# Patient Record
Sex: Female | Born: 1995
Health system: Southern US, Community
[De-identification: ages and names within clinical notes are randomized; demographics above are authoritative.]

## PROBLEM LIST (undated history)

## (undated) ENCOUNTER — Inpatient Hospital Stay (HOSPITAL_COMMUNITY): Payer: Self-pay

## (undated) DIAGNOSIS — Z3402 Encounter for supervision of normal first pregnancy, second trimester: Secondary | ICD-10-CM

## (undated) DIAGNOSIS — Z789 Other specified health status: Secondary | ICD-10-CM

## (undated) HISTORY — PX: NO PAST SURGERIES: SHX2092

## (undated) HISTORY — DX: Encounter for supervision of normal first pregnancy, second trimester: Z34.02

---
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
## 2015-02-06 NOTE — L&D Delivery Note (Signed)
Delivery Note At 9:41 PM a viable female was delivered via Vaginal, Spontaneous Delivery (Presentation: vertex, left occiput anterior).  APGAR: 9, 9; weight 7lb 1.2 oz (3210 g).   Placenta status: Intact. 3 vessel cord.  Anesthesia: Epidural Episiotomy: None Lacerations: None Suture Repair: N/A Est. Blood Loss (mL): 300  Mom to postpartum.  Baby to Couplet care / Skin to Skin.  Jamelle HaringHillary M Fitzgerald, MD [facility], PGY-2 12/23/2015, 10:21 PM  Patient is a G1 at 2334w5d who was admitted with SROM, uncomplicated prenatal course.  She progressed without augmentation.  I was gloved and present for delivery in its entirety.  Second stage of labor progressed to SVD.  No decels during second stage noted.  Complications: none  Lacerations: none  EBL: 300cc  Cam HaiSHAW, KIMBERLY, CNM 9:37 AM  12/24/2015

## 2015-04-26 ENCOUNTER — Emergency Department (HOSPITAL_COMMUNITY): Payer: BLUE CROSS/BLUE SHIELD

## 2015-04-26 ENCOUNTER — Encounter (HOSPITAL_COMMUNITY): Payer: Self-pay | Admitting: Emergency Medicine

## 2015-04-26 ENCOUNTER — Emergency Department (HOSPITAL_COMMUNITY)
Admission: EM | Admit: 2015-04-26 | Discharge: 2015-04-26 | Disposition: A | Discharge status: Home / Self Care | Payer: BLUE CROSS/BLUE SHIELD | Attending: Emergency Medicine | Admitting: Emergency Medicine

## 2015-04-26 DIAGNOSIS — Z7952 Long term (current) use of systemic steroids: Secondary | ICD-10-CM | POA: Insufficient documentation

## 2015-04-26 DIAGNOSIS — O9989 Other specified diseases and conditions complicating pregnancy, childbirth and the puerperium: Secondary | ICD-10-CM | POA: Insufficient documentation

## 2015-04-26 DIAGNOSIS — R1031 Right lower quadrant pain: Secondary | ICD-10-CM | POA: Insufficient documentation

## 2015-04-26 DIAGNOSIS — O209 Hemorrhage in early pregnancy, unspecified: Secondary | ICD-10-CM | POA: Diagnosis not present

## 2015-04-26 DIAGNOSIS — Z3A01 Less than 8 weeks gestation of pregnancy: Secondary | ICD-10-CM | POA: Insufficient documentation

## 2015-04-26 DIAGNOSIS — R1032 Left lower quadrant pain: Secondary | ICD-10-CM | POA: Diagnosis not present

## 2015-04-26 DIAGNOSIS — R110 Nausea: Secondary | ICD-10-CM | POA: Diagnosis not present

## 2015-04-26 DIAGNOSIS — Z792 Long term (current) use of antibiotics: Secondary | ICD-10-CM | POA: Diagnosis not present

## 2015-04-26 DIAGNOSIS — R102 Pelvic and perineal pain: Secondary | ICD-10-CM

## 2015-04-26 DIAGNOSIS — R109 Unspecified abdominal pain: Secondary | ICD-10-CM

## 2015-04-26 DIAGNOSIS — O26899 Other specified pregnancy related conditions, unspecified trimester: Secondary | ICD-10-CM

## 2015-04-26 LAB — URINE MICROSCOPIC-ADD ON

## 2015-04-26 LAB — URINALYSIS, ROUTINE W REFLEX MICROSCOPIC
Bilirubin Urine: NEGATIVE
GLUCOSE, UA: NEGATIVE mg/dL
HGB URINE DIPSTICK: NEGATIVE
Ketones, ur: >80 mg/dL — AB
Leukocytes, UA: NEGATIVE
Nitrite: NEGATIVE
PH: 6 (ref 5.0–8.0)
Protein, ur: NEGATIVE mg/dL
SPECIFIC GRAVITY, URINE: 1.025 (ref 1.005–1.030)

## 2015-04-26 LAB — COMPREHENSIVE METABOLIC PANEL
ALT: 11 U/L — AB (ref 14–54)
ANION GAP: 11 (ref 5–15)
AST: 13 U/L — ABNORMAL LOW (ref 15–41)
Albumin: 3.7 g/dL (ref 3.5–5.0)
Alkaline Phosphatase: 48 U/L (ref 38–126)
BUN: 10 mg/dL (ref 6–20)
CHLORIDE: 108 mmol/L (ref 101–111)
CO2: 20 mmol/L — AB (ref 22–32)
Calcium: 9.6 mg/dL (ref 8.9–10.3)
Creatinine, Ser: 0.83 mg/dL (ref 0.44–1.00)
GFR calc non Af Amer: >60 mL/min (ref >60)
GLUCOSE: 112 mg/dL — AB (ref 65–99)
Potassium: 3.9 mmol/L (ref 3.5–5.1)
SODIUM: 139 mmol/L (ref 135–145)
Total Bilirubin: 0.4 mg/dL (ref 0.3–1.2)
Total Protein: 7 g/dL (ref 6.5–8.1)

## 2015-04-26 LAB — CBC
HCT: 38 % (ref 36.0–46.0)
Hemoglobin: 12 g/dL (ref 12.0–15.0)
MCH: 25.5 pg — AB (ref 26.0–34.0)
MCHC: 31.6 g/dL (ref 30.0–36.0)
MCV: 80.7 fL (ref 78.0–100.0)
Platelets: 252 10*3/uL (ref 150–400)
RBC: 4.71 MIL/uL (ref 3.87–5.11)
RDW: 13.4 % (ref 11.5–15.5)
WBC: 8.7 10*3/uL (ref 4.0–10.5)

## 2015-04-26 LAB — WET PREP, GENITAL
SPERM: NONE SEEN
TRICH WET PREP: NONE SEEN
Yeast Wet Prep HPF POC: NONE SEEN

## 2015-04-26 LAB — I-STAT BETA HCG BLOOD, ED (MC, WL, AP ONLY): I-stat hCG, quantitative: 375.4 m[IU]/mL — ABNORMAL HIGH (ref <5)

## 2015-04-26 LAB — LIPASE, BLOOD: LIPASE: 25 U/L (ref 11–51)

## 2015-04-26 MED ORDER — PROMETHAZINE HCL 12.5 MG PO TABS: 12.5000 mg | ORAL_TABLET | Freq: Four times a day (QID) | ORAL | Status: DC | PRN

## 2015-04-26 NOTE — ED Provider Notes (Signed)
CSN: [id number]     Arrival date & time 04/26/15  1009 History  By signing my name below, I, Soijett Blue, attest that this documentation has been prepared under the direction and in the presence of Kerrie BuffaloHope Neese, NP Electronically Signed: Soijett Blue, ED Scribe. 04/26/2015. 1:27 PM.   Chief Complaint  Patient presents with  . Abdominal Pain      Patient is a 20 y.o. female presenting with abdominal pain. The history is provided by the patient. No language interpreter was used.  Abdominal Pain Pain location:  LLQ and RLQ Pain quality: cramping   Pain radiates to:  Does not radiate Pain severity:  Moderate Onset quality:  Sudden Duration:  3 days Timing:  Constant Progression:  Unchanged Chronicity:  New Context comment:  Thinks she may be pregnant Relieved by:  None tried Worsened by:  Nothing tried Ineffective treatments:  None tried Associated symptoms: nausea and vaginal discharge   Associated symptoms: no dysuria, no vaginal bleeding and no vomiting     HPI Comments: Bruna PotterJasmine Randazzo is a 20 y.o. female who presents to the Emergency Department complaining of 8-9/10, intermittent, cramping, lower abdominal pain onset 3 days. She reports that she could be pregnant at this time, although she has never been pregnant in the past. Pt notes that she took a pregnancy test at home that was positive. Patient's last menstrual period was 03/20/2015 lasting two days and was light  She states that she is having associated symptoms of nausea, breast tenderness, and vaginal discharge. She states that she has not tried any medications for the relief for her symptoms. She denies frequency, dysuria, vaginal bleeding, vomiting, and any other symptoms. [facility]. Denies taking any daily medications or PMHx.    History reviewed. No pertinent past medical history. History reviewed. No pertinent past surgical history. No family history on file. Social History  Substance Use  Topics  . Smoking status: Never Smoker   . Smokeless tobacco: None  . Alcohol Use: Yes     Comment: social   OB History    No data available     Review of Systems  Gastrointestinal: Positive for nausea and abdominal pain. Negative for vomiting.  Genitourinary: Positive for vaginal discharge. Negative for dysuria and vaginal bleeding.  All other systems reviewed and are negative.    Allergies  Review of patient's allergies indicates no known allergies.  Home Medications   Prior to Admission medications   Medication Sig Start Date End Date Taking? Authorizing Provider  hydrocortisone valerate ointment (WESTCORT) 0.2 % Apply 1 application topically 2 (two) times daily. 12/18/13   Doreene ElandKehinde T Eniola, MD  promethazine (PHENERGAN) 12.5 MG tablet Take 1 tablet (12.5 mg total) by mouth every 6 (six) hours as needed for nausea or vomiting. 04/26/15   Hope Orlene OchM Neese, NP  sulfamethoxazole-trimethoprim (BACTRIM DS,SEPTRA DS) 800-160 MG tablet Take 1 tablet by mouth 2 (two) times daily. 11/12/14   Palma HolterKanishka G Gunadasa, MD   BP 121/86 mmHg  Pulse 108  Temp(Src) 99 F (37.2 C) (Oral)  Resp 18  Ht 5\' 11"  (1.803 m)  Wt 124.739 kg  BMI 38.37 kg/m2  SpO2 100%  LMP 03/20/2015 Physical Exam  Constitutional: She is oriented to person, place, and time. She appears well-developed and well-nourished. No distress.  HENT:  Head: Normocephalic and atraumatic.  Eyes: EOM are normal.  Neck: Neck supple.  Cardiovascular: Normal rate, regular rhythm and normal heart sounds.  Exam reveals no  gallop and no friction rub.   No murmur heard. Pulmonary/Chest: Effort normal and breath sounds normal. No respiratory distress. She has no wheezes. She has no rales.  Abdominal: Soft. Bowel sounds are normal. There is tenderness. There is no rebound, no guarding and no CVA tenderness.  Tenderness to lower abdomen with palpation.   Genitourinary:  Chaperone present for exam External genitalia without lesions, frothy,  malodorous d/c vaginal vault. Cervix, long, closed, no CMT, bilateral adnexal tenderness, uterus without palpable enlargement.   Musculoskeletal: Normal range of motion.  Neurological: She is alert and oriented to person, place, and time.  Skin: Skin is warm and dry.  Psychiatric: She has a normal mood and affect. Her behavior is normal.  Nursing note and vitals reviewed.   ED Course  Procedures (including critical care time) DIAGNOSTIC STUDIES: Oxygen Saturation is 100% on RA, nl by my interpretation.    COORDINATION OF CARE: 1:26 PM Discussed treatment plan with pt at bedside which includes labs, UA, pelvic exam, [place], and pt agreed to plan.    Labs Review Labs Reviewed  WET PREP, GENITAL - Abnormal; Notable for the following:    Clue Cells Wet Prep HPF POC PRESENT (*)    WBC, Wet Prep HPF POC MODERATE (*)    All other components within normal limits  COMPREHENSIVE METABOLIC PANEL - Abnormal; Notable for the following:    CO2 20 (*)    Glucose, Bld 112 (*)    AST 13 (*)    ALT 11 (*)    All other components within normal limits  CBC - Abnormal; Notable for the following:    MCH 25.5 (*)    All other components within normal limits  URINALYSIS, ROUTINE W REFLEX MICROSCOPIC (NOT AT [facility]) - Abnormal; Notable for the following:    APPearance TURBID (*)    Ketones, ur >80 (*)    All other components within normal limits  URINE MICROSCOPIC-ADD ON - Abnormal; Notable for the following:    Squamous Epithelial / LPF 6-30 (*)    Bacteria, UA RARE (*)    All other components within normal limits  I-STAT BETA HCG BLOOD, ED ([facility], [facility], [facility] ONLY) - Abnormal; Notable for the following:    I-stat hCG, quantitative 375.4 (*)    All other components within normal limits  LIPASE, BLOOD  GC/CHLAMYDIA PROBE AMP ([place]) NOT AT [facility]    Imaging Review US Ob Comp Less 14 Wks  04/26/2015  CLINICAL DATA:  20 year old female with pelvic pain in the first trimester of pregnancy. Quantitative  beta HCG 375. Initial encounter. EXAM: OBSTETRIC <14 WK [place] AND TRANSVAGINAL OB US TECHNIQUE: Both transabdominal and transvaginal ultrasound examinations were performed for complete evaluation of the gestation as well as the maternal uterus, adnexal regions, and pelvic cul-de-sac. Transvaginal technique was performed to assess early pregnancy. COMPARISON:  None. FINDINGS: Intrauterine gestational sac: Questionable tiny hypoechoic sac in the fundus of the uterus near the left cornu (image 53). Yolk sac:  Possibly visible (same image). Embryo:  Not visible. MSD: 2.1  mm   4 w   6  d Subchorionic hemorrhage:  None visualized. Maternal uterus/adnexae: Small volume of simple appearing pelvic free fluid. The right ovary appears normal measuring 2.4 x 3.0 x 2.1 cm. The left ovary is larger encompassing 3.2 x 4.4 x 2.4 cm, and contains a 2.2 cm complex hypoechoic mass -but mostly with reticular pattern of internal echoes. No solid elements identified on Doppler. IMPRESSION: 1. Probable early  intrauterine gestational sac, but no definite yolk sac, fetal pole, or cardiac activity yet visualized. Recommend follow-up quantitative B-HCG levels and follow-up US in 14 days to confirm and assess viability. This recommendation follows SRU consensus guidelines: Diagnostic Criteria for Nonviable Pregnancy Early in the First Trimester. Malva Limes Med 2013; 132:4401-02. 2. Left ovarian 2.2 cm complex lesion, but favor hemorrhagic cyst / corpus luteum. Ectopic pregnancy not excluded at this point. 3. Small volume simple appearing pelvic free fluid. Electronically Signed   By: Odessa Fleming M.D.   On: 04/26/2015 16:31   US Ob Transvaginal  04/26/2015  CLINICAL DATA:  20 year old female with pelvic pain in the first trimester of pregnancy. Quantitative beta HCG 375. Initial encounter. EXAM: OBSTETRIC <14 WK [place] AND TRANSVAGINAL OB US TECHNIQUE: Both transabdominal and transvaginal ultrasound examinations were performed for complete evaluation of  the gestation as well as the maternal uterus, adnexal regions, and pelvic cul-de-sac. Transvaginal technique was performed to assess early pregnancy. COMPARISON:  None. FINDINGS: Intrauterine gestational sac: Questionable tiny hypoechoic sac in the fundus of the uterus near the left cornu (image 53). Yolk sac:  Possibly visible (same image). Embryo:  Not visible. MSD: 2.1  mm   4 w   6  d Subchorionic hemorrhage:  None visualized. Maternal uterus/adnexae: Small volume of simple appearing pelvic free fluid. The right ovary appears normal measuring 2.4 x 3.0 x 2.1 cm. The left ovary is larger encompassing 3.2 x 4.4 x 2.4 cm, and contains a 2.2 cm complex hypoechoic mass -but mostly with reticular pattern of internal echoes. No solid elements identified on Doppler. IMPRESSION: 1. Probable early intrauterine gestational sac, but no definite yolk sac, fetal pole, or cardiac activity yet visualized. Recommend follow-up quantitative B-HCG levels and follow-up US in 14 days to confirm and assess viability. This recommendation follows SRU consensus guidelines: Diagnostic Criteria for Nonviable Pregnancy Early in the First Trimester. Malva Limes Med 2013; 725:3664-40. 2. Left ovarian 2.2 cm complex lesion, but favor hemorrhagic cyst / corpus luteum. Ectopic pregnancy not excluded at this point. 3. Small volume simple appearing pelvic free fluid. Electronically Signed   By: Odessa Fleming M.D.   On: 04/26/2015 16:31   I have personally reviewed and evaluated these images and lab results as part of my medical decision-making.  DDX ectopic pregnancy, early IUP, ovarian cyst, torsion Doubt appendicitis, PID, torsion due to no guarding or rebound   MDM  20 y.o. female with pelvic pain in early pregnancy stable for d/c without acute abdomen. Feeling better after medication for nausea. Ultrasound shows possible early IUGS and left complex ovarian cyst. Patient will f/u in 2 days in the [facility] for repeat Bhcg. She will  go to [facility] sooner for increased pain or other problems.   Final diagnoses:  Abdominal pain during pregnancy  Vaginal bleeding in pregnancy, first trimester   I personally performed the services described in this documentation, which was scribed in my presence. The recorded information has been reviewed and is accurate.   [address] [place], NP 04/27/15 0034  Rolland Porter, MD 05/04/15 [phone]

## 2015-04-26 NOTE — ED Notes (Signed)
Attempted to draw labs and unsuccessful.  Labs with pt now.

## 2015-04-26 NOTE — ED Notes (Signed)
Pt reports abd pain x 3 days with nausea. Pt reports she could be pregnant. Pt alert x4. NAD at this time.

## 2015-04-26 NOTE — Discharge Instructions (Signed)
Follow up at [facility] in the Clinic on Thursday at 11 am. No sex, no tampons, nothing in the vagina until [facility].  If your bleeding or pain increases before then go [facility](Maternity Admissions).  Take tylenol as needed for pain.

## 2015-04-27 LAB — GC/CHLAMYDIA PROBE AMP (~~LOC~~) NOT AT ARMC
CHLAMYDIA, DNA PROBE: NEGATIVE
NEISSERIA GONORRHEA: NEGATIVE

## 2015-04-28 ENCOUNTER — Ambulatory Visit (INDEPENDENT_AMBULATORY_CARE_PROVIDER_SITE_OTHER): Payer: Self-pay | Admitting: Obstetrics & Gynecology

## 2015-04-28 DIAGNOSIS — O3680X0 Pregnancy with inconclusive fetal viability, not applicable or unspecified: Secondary | ICD-10-CM

## 2015-04-28 DIAGNOSIS — Z3201 Encounter for pregnancy test, result positive: Secondary | ICD-10-CM

## 2015-04-28 LAB — HCG, QUANTITATIVE, PREGNANCY: hCG, Beta Chain, Quant, S: 1277 m[IU]/mL — ABNORMAL HIGH (ref <5)

## 2015-04-28 NOTE — Patient Instructions (Signed)
First Trimester of Pregnancy The first trimester of pregnancy is from week 1 until the end of week 12 (months 1 through 3). A week after a sperm fertilizes an egg, the egg will implant on the wall of the uterus. This embryo will begin to develop into a baby. Genes from you and your partner are forming the baby. The female genes determine whether the baby is a boy or a girl. At 6-8 weeks, the eyes and face are formed, and the heartbeat can be seen on ultrasound. At the end of 12 weeks, all the baby's organs are formed.  Now that you are pregnant, you will want to do everything you can to have a healthy baby. Two of the most important things are to get good prenatal care and to follow your health care provider's instructions. Prenatal care is all the medical care you receive before the baby's birth. This care will help prevent, find, and treat any problems during the pregnancy and childbirth. BODY CHANGES Your body goes through many changes during pregnancy. The changes vary from woman to woman.   You may gain or lose a couple of pounds at first.  You may feel sick to your stomach (nauseous) and throw up (vomit). If the vomiting is uncontrollable, call your health care provider.  You may tire easily.  You may develop headaches that can be relieved by medicines approved by your health care provider.  You may urinate more often. Painful urination may mean you have a bladder infection.  You may develop heartburn as a result of your pregnancy.  You may develop constipation because certain hormones are causing the muscles that push waste through your intestines to slow down.  You may develop hemorrhoids or swollen, bulging veins (varicose veins).  Your breasts may begin to grow larger and become tender. Your nipples may stick out more, and the tissue that surrounds them (areola) may become darker.  Your gums may bleed and may be sensitive to brushing and flossing.  Dark spots or blotches (chloasma,  mask of pregnancy) may develop on your face. This will likely fade after the baby is born.  Your menstrual periods will stop.  You may have a loss of appetite.  You may develop cravings for certain kinds of food.  You may have changes in your emotions from day to day, such as being excited to be pregnant or being concerned that something may go wrong with the pregnancy and baby.  You may have more vivid and strange dreams.  You may have changes in your hair. These can include thickening of your hair, rapid growth, and changes in texture. Some women also have hair loss during or after pregnancy, or hair that feels dry or thin. Your hair will most likely return to normal after your baby is born. WHAT TO EXPECT AT YOUR PRENATAL VISITS During a routine prenatal visit:  You will be weighed to make sure you and the baby are growing normally.  Your blood pressure will be taken.  Your abdomen will be measured to track your baby's growth.  The fetal heartbeat will be listened to starting around week 10 or 12 of your pregnancy.  Test results from any previous visits will be discussed. Your health care provider may ask you:  How you are feeling.  If you are feeling the baby move.  If you have had any abnormal symptoms, such as leaking fluid, bleeding, severe headaches, or abdominal cramping.  If you are using any tobacco products,   including cigarettes, chewing tobacco, and electronic cigarettes.  If you have any questions. Other tests that may be performed during your first trimester include:  Blood tests to find your blood type and to check for the presence of any previous infections. They will also be used to check for low iron levels (anemia) and Rh antibodies. Later in the pregnancy, blood tests for diabetes will be done along with other tests if problems develop.  Urine tests to check for infections, diabetes, or protein in the urine.  An ultrasound to confirm the proper growth  and development of the baby.  An amniocentesis to check for possible genetic problems.  Fetal screens for spina bifida and Down syndrome.  You may need other tests to make sure you and the baby are doing well.  HIV (human immunodeficiency virus) testing. Routine prenatal testing includes screening for HIV, unless you choose not to have this test. HOME CARE INSTRUCTIONS  Medicines  Follow your health care provider's instructions regarding medicine use. Specific medicines may be either safe or unsafe to take during pregnancy.  Take your prenatal vitamins as directed.  If you develop constipation, try taking a stool softener if your health care provider approves. Diet  Eat regular, well-balanced meals. Choose a variety of foods, such as meat or vegetable-based protein, fish, milk and low-fat dairy products, vegetables, fruits, and whole grain breads and cereals. Your health care provider will help you determine the amount of weight gain that is right for you.  Avoid raw meat and uncooked cheese. These carry germs that can cause birth defects in the baby.  Eating four or five small meals rather than three large meals a day may help relieve nausea and vomiting. If you start to feel nauseous, eating a few soda crackers can be helpful. Drinking liquids between meals instead of during meals also seems to help nausea and vomiting.  If you develop constipation, eat more high-fiber foods, such as fresh vegetables or fruit and whole grains. Drink enough fluids to keep your urine clear or pale yellow. Activity and Exercise  Exercise only as directed by your health care provider. Exercising will help you:  Control your weight.  Stay in shape.  Be prepared for labor and delivery.  Experiencing pain or cramping in the lower abdomen or low back is a good sign that you should stop exercising. Check with your health care provider before continuing normal exercises.  Try to avoid standing for long  periods of time. Move your legs often if you must stand in one place for a long time.  Avoid heavy lifting.  Wear low-heeled shoes, and practice good posture.  You may continue to have sex unless your health care provider directs you otherwise. Relief of Pain or Discomfort  Wear a good support bra for breast tenderness.   Take warm sitz baths to soothe any pain or discomfort caused by hemorrhoids. Use hemorrhoid cream if your health care provider approves.   Rest with your legs elevated if you have leg cramps or low back pain.  If you develop varicose veins in your legs, wear support hose. Elevate your feet for 15 minutes, 3-4 times a day. Limit salt in your diet. Prenatal Care  Schedule your prenatal visits by the twelfth week of pregnancy. They are usually scheduled monthly at first, then more often in the last 2 months before delivery.  Write down your questions. Take them to your prenatal visits.  Keep all your prenatal visits as directed by your   health care provider. Safety  Wear your seat belt at all times when driving.  Make a list of emergency phone numbers, including numbers for family, friends, the hospital, and police and fire departments. General Tips  Ask your health care provider for a referral to a local prenatal education class. Begin classes no later than at the beginning of month 6 of your pregnancy.  Ask for help if you have counseling or nutritional needs during pregnancy. Your health care provider can offer advice or refer you to specialists for help with various needs.  Do not use hot tubs, steam rooms, or saunas.  Do not douche or use tampons or scented sanitary pads.  Do not cross your legs for long periods of time.  Avoid cat litter boxes and soil used by cats. These carry germs that can cause birth defects in the baby and possibly loss of the fetus by miscarriage or stillbirth.  Avoid all smoking, herbs, alcohol, and medicines not prescribed by  your health care provider. Chemicals in these affect the formation and growth of the baby.  Do not use any tobacco products, including cigarettes, chewing tobacco, and electronic cigarettes. If you need help quitting, ask your health care provider. You may receive counseling support and other resources to help you quit.  Schedule a dentist appointment. At home, brush your teeth with a soft toothbrush and be gentle when you floss. SEEK MEDICAL CARE IF:   You have dizziness.  You have mild pelvic cramps, pelvic pressure, or nagging pain in the abdominal area.  You have persistent nausea, vomiting, or diarrhea.  You have a bad smelling vaginal discharge.  You have pain with urination.  You notice increased swelling in your face, hands, legs, or ankles. SEEK IMMEDIATE MEDICAL CARE IF:   You have a fever.  You are leaking fluid from your vagina.  You have spotting or bleeding from your vagina.  You have severe abdominal cramping or pain.  You have rapid weight gain or loss.  You vomit blood or material that looks like coffee grounds.  You are exposed to German measles and have never had them.  You are exposed to fifth disease or chickenpox.  You develop a severe headache.  You have shortness of breath.  You have any kind of trauma, such as from a fall or a car accident.   This information is not intended to replace advice given to you by your health care provider. Make sure you discuss any questions you have with your health care provider.   Document Released: 01/16/2001 Document Revised: 02/12/2014 Document Reviewed: 12/02/2012 Elsevier Interactive Patient Education 2016 Elsevier Inc.  

## 2015-04-28 NOTE — Progress Notes (Signed)
Ms. Tina Keller  is a 20 y.o. No obstetric history on file. at Unknown who [facility] for follow-up quant hCG after 48 hours. The patient was seen in ED on 3/21 and had quant hCG of 375 and US showed possible sac IU. She denies/endorses no pain, vaginal bleeding or fever today.   OB History  No data available    No past medical history on file.   LMP 03/20/2015  CONSTITUTIONAL: Well-developed, well-nourished female in no acute distress.  ENT: External right and left ear normal.  EYES: EOM intact, conjunctivae normal.  MUSCULOSKELETAL: Normal range of motion.  CARDIOVASCULAR: Regular heart rate RESPIRATORY: Normal effort NEUROLOGICAL: Alert and oriented to person, place, and time.  SKIN: Skin is warm and dry. No rash noted. Not diaphoretic. No erythema. No pallor. PSYCH: Normal mood and affect. Normal behavior. Normal judgment and thought content.  Results for orders placed or performed in visit on 04/28/15 (from the past 24 hour(s))  hCG, quantitative, pregnancy     Status: Abnormal   Collection Time: 04/28/15 11:34 AM  Result Value Ref Range   hCG, Beta Chain, Quant, S 1277 (H) <5 mIU/mL    A: Appropriate rise in quant hCG after 48 hours  P: Discharge home First trimester/ectopic precautions discussed Patient will return for follow-up US in 1 week. Order placed and RN to schedule. [facility].  Patient may return to [facility] as needed or if her condition were to change or worsen   Adam PhenixJames G Arnold, MD 04/28/2015 2:34 PM

## 2015-05-04 ENCOUNTER — Ambulatory Visit (HOSPITAL_COMMUNITY)
Admission: RE | Admit: 2015-05-04 | Discharge: 2015-05-04 | Disposition: A | Discharge status: Home / Self Care | Payer: BLUE CROSS/BLUE SHIELD | Source: Ambulatory Visit | Attending: Obstetrics & Gynecology | Admitting: Obstetrics & Gynecology

## 2015-05-04 DIAGNOSIS — O3481 Maternal care for other abnormalities of pelvic organs, first trimester: Secondary | ICD-10-CM | POA: Diagnosis not present

## 2015-05-04 DIAGNOSIS — Z36 Encounter for antenatal screening of mother: Secondary | ICD-10-CM | POA: Insufficient documentation

## 2015-05-04 DIAGNOSIS — Z3A01 Less than 8 weeks gestation of pregnancy: Secondary | ICD-10-CM | POA: Insufficient documentation

## 2015-05-04 DIAGNOSIS — N8312 Corpus luteum cyst of left ovary: Secondary | ICD-10-CM | POA: Diagnosis not present

## 2015-05-04 DIAGNOSIS — O3680X0 Pregnancy with inconclusive fetal viability, not applicable or unspecified: Secondary | ICD-10-CM

## 2015-05-05 ENCOUNTER — Ambulatory Visit (HOSPITAL_COMMUNITY): Admission: RE | Admit: 2015-05-05 | Payer: Self-pay | Source: Ambulatory Visit

## 2015-05-06 ENCOUNTER — Other Ambulatory Visit: Payer: BLUE CROSS/BLUE SHIELD

## 2015-05-06 DIAGNOSIS — Z3401 Encounter for supervision of normal first pregnancy, first trimester: Secondary | ICD-10-CM

## 2015-05-06 NOTE — Progress Notes (Signed)
New ob labs done today Tina Keller 

## 2015-05-07 LAB — HIV ANTIBODY (ROUTINE TESTING W REFLEX): HIV 1&2 Ab, 4th Generation: NONREACTIVE

## 2015-05-07 LAB — SICKLE CELL SCREEN: Sickle Cell Screen: NEGATIVE

## 2015-05-08 LAB — CULTURE, OB URINE

## 2015-05-09 ENCOUNTER — Encounter: Payer: Self-pay | Admitting: *Deleted

## 2015-05-09 ENCOUNTER — Telehealth: Payer: Self-pay | Admitting: Family Medicine

## 2015-05-09 LAB — OBSTETRIC PANEL
Antibody Screen: NEGATIVE
BASOS ABS: 0 10*3/uL (ref 0.0–0.1)
BASOS PCT: 0 % (ref 0–1)
EOS ABS: 0 10*3/uL (ref 0.0–0.7)
EOS PCT: 0 % (ref 0–5)
HEMATOCRIT: 36.7 % (ref 36.0–46.0)
HEP B S AG: NEGATIVE
Hemoglobin: 11.9 g/dL — ABNORMAL LOW (ref 12.0–15.0)
LYMPHS ABS: 2.2 10*3/uL (ref 0.7–4.0)
Lymphocytes Relative: 40 % (ref 12–46)
MCH: 26 pg (ref 26.0–34.0)
MCHC: 32.4 g/dL (ref 30.0–36.0)
MCV: 80.3 fL (ref 78.0–100.0)
MPV: 10.6 fL (ref 8.6–12.4)
Monocytes Absolute: 0.8 10*3/uL (ref 0.1–1.0)
Monocytes Relative: 15 % — ABNORMAL HIGH (ref 3–12)
NEUTROS ABS: 2.4 10*3/uL (ref 1.7–7.7)
Neutrophils Relative %: 45 % (ref 43–77)
Platelets: 250 10*3/uL (ref 150–400)
RBC: 4.57 MIL/uL (ref 3.87–5.11)
RDW: 14.3 % (ref 11.5–15.5)
Rh Type: POSITIVE
WBC: 5.4 10*3/uL (ref 4.0–10.5)

## 2015-05-09 NOTE — Telephone Encounter (Signed)
-----   Message from Uvaldo RisingKyle J Fletke, MD sent at 05/09/2015 10:38 AM EDT -----   ----- Message -----    From: Lab in [facility] Interface    Sent: 05/06/2015  10:24 PM      To: Uvaldo RisingKyle J Fletke, MD

## 2015-05-09 NOTE — Telephone Encounter (Signed)
Her mom picked up. Mom asked to have her call our clinic back for discussion.   Note: Prenatal lab ok except for + urine GBS of 20,000 CFU. Ideally we treat patient with > 100,000 CFU but per new recommendation and after discussion with Dr. Shawnie PonsPratt it is appropriate to treat if >10,000 CFU/ml. I called to discuss treatment plan with patient but her mom picked up instead.  Will discuss above with her when she return call, otherwise she has prenatal follow up with Dr. Natale MilchLancaster on 05/13/15, she can treat her then.

## 2015-05-09 NOTE — Telephone Encounter (Signed)
This encounter was created in error - please disregard.

## 2015-05-09 NOTE — Telephone Encounter (Addendum)
Note: Prenatal lab ok except for + urine GBS of 20,000 CFU. Ideally we treat patient with > 100,000 CFU but per new recommendation and after discussion with Dr. Shawnie PonsPratt it is appropriate to treat if >10,000 CFU/ml. I called to discuss treatment plan with patient but her mom picked up instead.  Will discuss above with her when she return call, otherwise she has prenatal follow up with Dr. Natale MilchLancaster on 05/13/15, she can treat her then   Patient called back regarding lab result.  Precept with Dr. Lorelee Newhamliss regarding urine results below.  Urine is barely positive per Dr. Deirdre Priesthambliss.  Patient should be advised that treatment will be discussed at next office visit 05/13/15.  Clovis PuMartin, Laiya Wisby L, RN

## 2015-05-12 ENCOUNTER — Inpatient Hospital Stay (HOSPITAL_COMMUNITY)
Admission: AD | Admit: 2015-05-12 | Discharge: 2015-05-12 | Disposition: A | Discharge status: Home / Self Care | Payer: BLUE CROSS/BLUE SHIELD | Source: Ambulatory Visit | Attending: Obstetrics & Gynecology | Admitting: Obstetrics & Gynecology

## 2015-05-12 ENCOUNTER — Encounter (HOSPITAL_COMMUNITY): Payer: Self-pay | Admitting: *Deleted

## 2015-05-12 DIAGNOSIS — N760 Acute vaginitis: Secondary | ICD-10-CM | POA: Diagnosis not present

## 2015-05-12 DIAGNOSIS — O209 Hemorrhage in early pregnancy, unspecified: Secondary | ICD-10-CM | POA: Diagnosis not present

## 2015-05-12 DIAGNOSIS — O26891 Other specified pregnancy related conditions, first trimester: Secondary | ICD-10-CM | POA: Insufficient documentation

## 2015-05-12 DIAGNOSIS — B9689 Other specified bacterial agents as the cause of diseases classified elsewhere: Secondary | ICD-10-CM

## 2015-05-12 DIAGNOSIS — O4691 Antepartum hemorrhage, unspecified, first trimester: Secondary | ICD-10-CM | POA: Diagnosis not present

## 2015-05-12 DIAGNOSIS — Z3A01 Less than 8 weeks gestation of pregnancy: Secondary | ICD-10-CM | POA: Insufficient documentation

## 2015-05-12 HISTORY — DX: Other specified health status: Z78.9

## 2015-05-12 LAB — URINALYSIS, ROUTINE W REFLEX MICROSCOPIC
Bilirubin Urine: NEGATIVE
GLUCOSE, UA: NEGATIVE mg/dL
Ketones, ur: 15 mg/dL — AB
LEUKOCYTES UA: NEGATIVE
Nitrite: NEGATIVE
PROTEIN: NEGATIVE mg/dL
SPECIFIC GRAVITY, URINE: 1.025 (ref 1.005–1.030)
pH: 6 (ref 5.0–8.0)

## 2015-05-12 LAB — URINE MICROSCOPIC-ADD ON

## 2015-05-12 LAB — WET PREP, GENITAL
Sperm: NONE SEEN
TRICH WET PREP: NONE SEEN
Yeast Wet Prep HPF POC: NONE SEEN

## 2015-05-12 MED ORDER — METRONIDAZOLE 500 MG PO TABS: 500.0000 mg | ORAL_TABLET | Freq: Two times a day (BID) | ORAL | Status: DC

## 2015-05-12 NOTE — MAU Note (Signed)
Pt stated she had some spotting yesterday and today. Denies pain.

## 2015-05-12 NOTE — MAU Provider Note (Signed)
History     CSN: [id number]  Arrival date and time: 05/12/15 1020   First Provider Initiated Contact with Patient 05/12/15 1128      Chief Complaint  Patient presents with  . Vaginal Bleeding   HPI   [name] is a 20 y.o. female G1P0 at [redacted]w[redacted]d presenting to MAU with concerns about light pink spotting that started yesterday. It started yesterday and stopped and when she woke up this morning she noticed it again. The spotting is only noted when she wipes.  Patient has had recent US that shows IUP.   No pain at this time No recent intercourse.   OB History    Gravida Para Term Preterm AB TAB SAB Ectopic Multiple Living   1               Past Medical History  Diagnosis Date  . Medical history non-contributory     Past Surgical History  Procedure Laterality Date  . No past surgeries      No family history on file.  Social History  Substance Use Topics  . Smoking status: Never Smoker   . Smokeless tobacco: Not on file  . Alcohol Use: Yes     Comment: social    Allergies: No Known Allergies  Prescriptions prior to admission  Medication Sig Dispense Refill Last Dose  . Prenatal Vit-Fe Fumarate-FA (PRENATAL MULTIVITAMIN) TABS tablet Take 1 tablet by mouth daily at 12 noon.   05/11/2015 at Unknown time  . promethazine (PHENERGAN) 12.5 MG tablet Take 1 tablet (12.5 mg total) by mouth every 6 (six) hours as needed for nausea or vomiting. 30 tablet 0 Past Week at Unknown time  . [DISCONTINUED] hydrocortisone valerate ointment (WESTCORT) 0.2 % Apply 1 application topically 2 (two) times daily. 45 g 0    Results for orders placed or performed during the hospital encounter of 05/12/15 (from the past 48 hour(s))  Urinalysis, Routine w reflex microscopic (not at [facility])     Status: Abnormal   Collection Time: 05/12/15 10:45 AM  Result Value Ref Range   Color, Urine YELLOW YELLOW   APPearance CLEAR CLEAR   Specific Gravity, Urine 1.025 1.005 - 1.030   pH 6.0 5.0 - 8.0   Glucose, UA NEGATIVE NEGATIVE mg/dL   Hgb urine dipstick LARGE (A) NEGATIVE   Bilirubin Urine NEGATIVE NEGATIVE   Ketones, ur 15 (A) NEGATIVE mg/dL   Protein, ur NEGATIVE NEGATIVE mg/dL   Nitrite NEGATIVE NEGATIVE   Leukocytes, UA NEGATIVE NEGATIVE  Urine microscopic-add on     Status: Abnormal   Collection Time: 05/12/15 10:45 AM  Result Value Ref Range   Squamous Epithelial / LPF 6-30 (A) NONE SEEN   WBC, UA 0-5 0 - 5 WBC/hpf   RBC / HPF 0-5 0 - 5 RBC/hpf   Bacteria, UA FEW (A) NONE SEEN   Urine-Other MUCOUS PRESENT   Wet prep, genital     Status: Abnormal   Collection Time: 05/12/15 12:10 PM  Result Value Ref Range   Yeast Wet Prep HPF POC NONE SEEN NONE SEEN   Trich, Wet Prep NONE SEEN NONE SEEN   Clue Cells Wet Prep HPF POC FEW (A) NONE SEEN   WBC, Wet Prep HPF POC FEW (A) NONE SEEN    Comment: MODERATE BACTERIA SEEN   Sperm NONE SEEN     Review of Systems  Constitutional: Negative for fever and chills.  Gastrointestinal: Positive for nausea. Negative for vomiting and abdominal pain.   Physical Exam  Blood pressure 104/65, pulse 105, temperature 98.1 F (36.7 C), temperature source Oral, resp. rate 18, height 5\' 8"  (1.727 m), weight 273 lb (123.832 kg), last menstrual period 03/20/2015.  Physical Exam  Constitutional: She is oriented to person, place, and time. She appears well-developed and well-nourished. No distress.  HENT:  Head: Normocephalic.  Eyes: Pupils are equal, round, and reactive to light.  Neck: Neck supple.  Respiratory: Effort normal.  Genitourinary:  Speculum exam: Vagina - Small amount of creamy, pink discharge, Mild odor Cervix - No contact bleeding, no active bleeding from the cervix.  Bimanual exam: Cervix closed Uterus non tender, enlarged  GC/Chlam, wet prep done Chaperone present for exam.  Musculoskeletal: Normal range of motion.  Neurological: She is alert and oriented to person, place, and time.  Skin: Skin is warm. She is not  diaphoretic.  Psychiatric: Her behavior is normal.    MAU Course  Procedures  None  MDM  O positive blood type Fetal heart tones confirmed by bedside US; done by Dorathy KinsmanVirginia Smith CNM. FHT: 140   Assessment and Plan   A:  1. Vaginal bleeding in pregnancy, first trimester   2. BV (bacterial vaginosis)     P:  Discharge home in stable condition RX: Flagyl Follow up with OB as scheduled Return to MAU if symptoms worsen Pelvic rest Bleeding precautions.    Duane LopeJennifer I Joline Encalada, NP 05/12/2015 2:00 PM

## 2015-05-12 NOTE — Discharge Instructions (Signed)
Bacterial Vaginosis Bacterial vaginosis is a vaginal infection that occurs when the normal balance of bacteria in the vagina is disrupted. It results from an overgrowth of certain bacteria. This is the most common vaginal infection in women of childbearing age. Treatment is important to prevent complications, especially in pregnant women, as it can cause a premature delivery. CAUSES  Bacterial vaginosis is caused by an increase in harmful bacteria that are normally present in smaller amounts in the vagina. Several different kinds of bacteria can cause bacterial vaginosis. However, the reason that the condition develops is not fully understood. RISK FACTORS Certain activities or behaviors can put you at an increased risk of developing bacterial vaginosis, including:  Having a new sex partner or multiple sex partners.  Douching.  Using an intrauterine device (IUD) for contraception. Women do not get bacterial vaginosis from toilet seats, bedding, swimming pools, or contact with objects around them. SIGNS AND SYMPTOMS  Some women with bacterial vaginosis have no signs or symptoms. Common symptoms include:  Grey vaginal discharge.  A fishlike odor with discharge, especially after sexual intercourse.  Itching or burning of the vagina and vulva.  Burning or pain with urination. DIAGNOSIS  Your health care provider will take a medical history and examine the vagina for signs of bacterial vaginosis. A sample of vaginal fluid may be taken. Your health care provider will look at this sample under a microscope to check for bacteria and abnormal cells. A vaginal pH test may also be done.  TREATMENT  Bacterial vaginosis may be treated with antibiotic medicines. These may be given in the form of a pill or a vaginal cream. A second round of antibiotics may be prescribed if the condition comes back after treatment. Because bacterial vaginosis increases your risk for sexually transmitted diseases, getting  treated can help reduce your risk for chlamydia, gonorrhea, HIV, and herpes. HOME CARE INSTRUCTIONS   Only take over-the-counter or prescription medicines as directed by your health care provider.  If antibiotic medicine was prescribed, take it as directed. Make sure you finish it even if you start to feel better.  Tell all sexual partners that you have a vaginal infection. They should see their health care provider and be treated if they have problems, such as a mild rash or itching.  During treatment, it is important that you follow these instructions:  Avoid sexual activity or use condoms correctly.  Do not douche.  Avoid alcohol as directed by your health care provider.  Avoid breastfeeding as directed by your health care provider. SEEK MEDICAL CARE IF:   Your symptoms are not improving after 3 days of treatment.  You have increased discharge or pain.  You have a fever. MAKE SURE YOU:   Understand these instructions.  Will watch your condition.  Will get help right away if you are not doing well or get worse. FOR MORE INFORMATION  Centers for Disease Control and Prevention, Division of STD Prevention: SolutionApps.co.zawww.cdc.gov/std American Sexual Health Association (ASHA): www.ashastd.org    This information is not intended to replace advice given to you by your health care provider. Make sure you discuss any questions you have with your health care provider.   Document Released: 01/22/2005 Document Revised: 02/12/2014 Document Reviewed: 09/03/2012 Elsevier Interactive Patient Education 2016 Elsevier Inc. Pelvic Rest Pelvic rest is sometimes recommended for women when:   The placenta is partially or completely covering the opening of the cervix (placenta previa).  There is bleeding between the uterine wall and the amniotic  sac in the first trimester (subchorionic hemorrhage).  The cervix begins to open without labor starting (incompetent cervix, cervical insufficiency).  The  labor is too early (preterm labor). HOME CARE INSTRUCTIONS  Do not have sexual intercourse, stimulation, or an orgasm.  Do not use tampons, douche, or put anything in the vagina.  Do not lift anything over 10 pounds (4.5 kg).  Avoid strenuous activity or straining your pelvic muscles. SEEK MEDICAL CARE IF:  You have any vaginal bleeding during pregnancy. Treat this as a potential emergency.  You have cramping pain felt low in the stomach (stronger than menstrual cramps).  You notice vaginal discharge (watery, mucus, or bloody).  You have a low, dull backache.  There are regular contractions or uterine tightening. SEEK IMMEDIATE MEDICAL CARE IF: You have vaginal bleeding and have placenta previa.    This information is not intended to replace advice given to you by your health care provider. Make sure you discuss any questions you have with your health care provider.   Document Released: 05/19/2010 Document Revised: 04/16/2011 Document Reviewed: 07/26/2014 Elsevier Interactive Patient Education [employer]2016 Elsevier Inc.

## 2015-05-13 ENCOUNTER — Encounter: Payer: BLUE CROSS/BLUE SHIELD | Admitting: Internal Medicine

## 2015-05-13 LAB — GC/CHLAMYDIA PROBE AMP (~~LOC~~) NOT AT ARMC
Chlamydia: NEGATIVE
NEISSERIA GONORRHEA: NEGATIVE

## 2015-05-24 ENCOUNTER — Ambulatory Visit (INDEPENDENT_AMBULATORY_CARE_PROVIDER_SITE_OTHER): Payer: BLUE CROSS/BLUE SHIELD | Admitting: Internal Medicine

## 2015-05-24 VITALS — BP 121/79 | HR 76 | Temp 98.6°F | Wt 274.8 lb

## 2015-05-24 DIAGNOSIS — Z3401 Encounter for supervision of normal first pregnancy, first trimester: Secondary | ICD-10-CM | POA: Diagnosis not present

## 2015-05-24 LAB — GLUCOSE, CAPILLARY: GLUCOSE-CAPILLARY: 85 mg/dL (ref 65–99)

## 2015-05-24 MED ORDER — CEPHALEXIN 500 MG PO CAPS: 500.0000 mg | ORAL_CAPSULE | Freq: Two times a day (BID) | ORAL | Status: AC

## 2015-05-24 NOTE — Progress Notes (Signed)
Tina Keller is a 20 y.o. yo G1P0 at 2720w2d who presents for her initial prenatal visit. Pregnancy is not planned, however patient is happy about pregnancy  She reports morning sickness and nausea. She  is taking PNV. See flow sheet for details.  PMH, POBH, FH, meds, allergies and Social Hx reviewed.  Prenatal Exam: Gen: Well nourished, well developed.  No distress.  Vitals noted. HEENT: Normocephalic, atraumatic.  Neck supple without cervical lymphadenopathy, thyromegaly or thyroid nodules.  Fair dentition. CV: RRR no murmur, gallops or rubs Lungs: CTAB.  Normal respiratory effort without wheezes or rales. Abd: soft, NTND. +BS.  Uterus not appreciated above pelvis. GU: Normal external female genitalia without lesions.  Normal vaginal, well rugated without lesions. No vaginal discharge.  Bimanual exam: No adnexal mass or TTP. No CMT.  Uterus size not palpated  Ext: No clubbing, cyanosis or edema. Psych: Normal grooming and dress.  Not depressed or anxious appearing.  Normal thought content and process without flight of ideas or looseness of associations.  Assessment & Plan: 1) 20 y.o. yo G1P0 at 4320w2d via LMP doing well.  Current pregnancy issues include morning sickness, patient still getting good PO intake. Advised patient to return if she was not able to anything in or could not get fluid intake. Indicated patient could try ginger lozenges for morning sickness. Dating is reliable. Prenatal labs reviewed, notable for OB Culture, GBS positive at 20,000 CFU, provided 10 days of keflex 500 mg BID Genetic screening offered: Would like to get the [place] screen.  Early glucola is indicated and was done due BMI of 40,  African American, and family hx of diabetes (maternal grandmother) BMI of 40, Morbid Obesity - provided patient with some nutrition education and referral to nutrition  PHQ-9 and Pregnancy Medical Home forms completed and reviewed.  Bleeding and pain precautions  reviewed. Importance of prenatal vitamins reviewed.  Follow up in 4 weeks.

## 2015-05-24 NOTE — Patient Instructions (Signed)
You have an infection in your urine and will need to take keflex for 10 days. I would recommend you seeing nutrition at some point to help with determine foods that might keep you most healthy during this pregnancy.   First Trimester of Pregnancy The first trimester of pregnancy is from week 1 until the end of week 12 (months 1 through 3). A week after a sperm fertilizes an egg, the egg will implant on the wall of the uterus. This embryo will begin to develop into a baby. Genes from you and your partner are forming the baby. The female genes determine whether the baby is a boy or a girl. At 6-8 weeks, the eyes and face are formed, and the heartbeat can be seen on ultrasound. At the end of 12 weeks, all the baby's organs are formed.  Now that you are pregnant, you will want to do everything you can to have a healthy baby. Two of the most important things are to get good prenatal care and to follow your health care provider's instructions. Prenatal care is all the medical care you receive before the baby's birth. This care will help prevent, find, and treat any problems during the pregnancy and childbirth. BODY CHANGES Your body goes through many changes during pregnancy. The changes vary from woman to woman.   You may gain or lose a couple of pounds at first.  You may feel sick to your stomach (nauseous) and throw up (vomit). If the vomiting is uncontrollable, call your health care provider.  You may tire easily.  You may develop headaches that can be relieved by medicines approved by your health care provider.  You may urinate more often. Painful urination may mean you have a bladder infection.  You may develop heartburn as a result of your pregnancy.  You may develop constipation because certain hormones are causing the muscles that push waste through your intestines to slow down.  You may develop hemorrhoids or swollen, bulging veins (varicose veins).  Your breasts may begin to grow larger  and become tender. Your nipples may stick out more, and the tissue that surrounds them (areola) may become darker.  Your gums may bleed and may be sensitive to brushing and flossing.  Dark spots or blotches (chloasma, mask of pregnancy) may develop on your face. This will likely fade after the baby is born.  Your menstrual periods will stop.  You may have a loss of appetite.  You may develop cravings for certain kinds of food.  You may have changes in your emotions from day to day, such as being excited to be pregnant or being concerned that something may go wrong with the pregnancy and baby.  You may have more vivid and strange dreams.  You may have changes in your hair. These can include thickening of your hair, rapid growth, and changes in texture. Some women also have hair loss during or after pregnancy, or hair that feels dry or thin. Your hair will most likely return to normal after your baby is born. WHAT TO EXPECT AT YOUR PRENATAL VISITS During a routine prenatal visit:  You will be weighed to make sure you and the baby are growing normally.  Your blood pressure will be taken.  Your abdomen will be measured to track your baby's growth.  The fetal heartbeat will be listened to starting around week 10 or 12 of your pregnancy.  Test results from any previous visits will be discussed. Your health care provider may  ask you:  How you are feeling.  If you are feeling the baby move.  If you have had any abnormal symptoms, such as leaking fluid, bleeding, severe headaches, or abdominal cramping.  If you are using any tobacco products, including cigarettes, chewing tobacco, and electronic cigarettes.  If you have any questions. Other tests that may be performed during your first trimester include:  Blood tests to find your blood type and to check for the presence of any previous infections. They will also be used to check for low iron levels (anemia) and Rh antibodies. Later  in the pregnancy, blood tests for diabetes will be done along with other tests if problems develop.  Urine tests to check for infections, diabetes, or protein in the urine.  An ultrasound to confirm the proper growth and development of the baby.  An amniocentesis to check for possible genetic problems.  Fetal screens for spina bifida and Down syndrome.  You may need other tests to make sure you and the baby are doing well.  HIV (human immunodeficiency virus) testing. Routine prenatal testing includes screening for HIV, unless you choose not to have this test. HOME CARE INSTRUCTIONS  Medicines  Follow your health care provider's instructions regarding medicine use. Specific medicines may be either safe or unsafe to take during pregnancy.  Take your prenatal vitamins as directed.  If you develop constipation, try taking a stool softener if your health care provider approves. Diet  Eat regular, well-balanced meals. Choose a variety of foods, such as meat or vegetable-based protein, fish, milk and low-fat dairy products, vegetables, fruits, and whole grain breads and cereals. Your health care provider will help you determine the amount of weight gain that is right for you.  Avoid raw meat and uncooked cheese. These carry germs that can cause birth defects in the baby.  Eating four or five small meals rather than three large meals a day may help relieve nausea and vomiting. If you start to feel nauseous, eating a few soda crackers can be helpful. Drinking liquids between meals instead of during meals also seems to help nausea and vomiting.  If you develop constipation, eat more high-fiber foods, such as fresh vegetables or fruit and whole grains. Drink enough fluids to keep your urine clear or pale yellow. Activity and Exercise  Exercise only as directed by your health care provider. Exercising will help you:  Control your weight.  Stay in shape.  Be prepared for labor and  delivery.  Experiencing pain or cramping in the lower abdomen or low back is a good sign that you should stop exercising. Check with your health care provider before continuing normal exercises.  Try to avoid standing for long periods of time. Move your legs often if you must stand in one place for a long time.  Avoid heavy lifting.  Wear low-heeled shoes, and practice good posture.  You may continue to have sex unless your health care provider directs you otherwise. Relief of Pain or Discomfort  Wear a good support bra for breast tenderness.   Take warm sitz baths to soothe any pain or discomfort caused by hemorrhoids. Use hemorrhoid cream if your health care provider approves.   Rest with your legs elevated if you have leg cramps or low back pain.  If you develop varicose veins in your legs, wear support hose. Elevate your feet for 15 minutes, 3-4 times a day. Limit salt in your diet. Prenatal Care  Schedule your prenatal visits by the twelfth  week of pregnancy. They are usually scheduled monthly at first, then more often in the last 2 months before delivery.  Write down your questions. Take them to your prenatal visits.  Keep all your prenatal visits as directed by your health care provider. Safety  Wear your seat belt at all times when driving.  Make a list of emergency phone numbers, including numbers for family, friends, the hospital, and police and fire departments. General Tips  Ask your health care provider for a referral to a local prenatal education class. Begin classes no later than at the beginning of month 6 of your pregnancy.  Ask for help if you have counseling or nutritional needs during pregnancy. Your health care provider can offer advice or refer you to specialists for help with various needs.  Do not use hot tubs, steam rooms, or saunas.  Do not douche or use tampons or scented sanitary pads.  Do not cross your legs for long periods of time.  Avoid  cat litter boxes and soil used by cats. These carry germs that can cause birth defects in the baby and possibly loss of the fetus by miscarriage or stillbirth.  Avoid all smoking, herbs, alcohol, and medicines not prescribed by your health care provider. Chemicals in these affect the formation and growth of the baby.  Do not use any tobacco products, including cigarettes, chewing tobacco, and electronic cigarettes. If you need help quitting, ask your health care provider. You may receive counseling support and other resources to help you quit.  Schedule a dentist appointment. At home, brush your teeth with a soft toothbrush and be gentle when you floss. SEEK MEDICAL CARE IF:   You have dizziness.  You have mild pelvic cramps, pelvic pressure, or nagging pain in the abdominal area.  You have persistent nausea, vomiting, or diarrhea.  You have a bad smelling vaginal discharge.  You have pain with urination.  You notice increased swelling in your face, hands, legs, or ankles. SEEK IMMEDIATE MEDICAL CARE IF:   You have a fever.  You are leaking fluid from your vagina.  You have spotting or bleeding from your vagina.  You have severe abdominal cramping or pain.  You have rapid weight gain or loss.  You vomit blood or material that looks like coffee grounds.  You are exposed to [place] measles and have never had them.  You are exposed to fifth disease or chickenpox.  You develop a severe headache.  You have shortness of breath.  You have any kind of trauma, such as from a fall or a car accident.   This information is not intended to replace advice given to you by your health care provider. Make sure you discuss any questions you have with your health care provider.   Document Released: 01/16/2001 Document Revised: 02/12/2014 Document Reviewed: 12/02/2012 Elsevier Interactive Patient Education [employer]

## 2015-05-27 ENCOUNTER — Telehealth: Payer: Self-pay | Admitting: *Deleted

## 2015-05-27 DIAGNOSIS — IMO0001 Reserved for inherently not codable concepts without codable children: Secondary | ICD-10-CM

## 2015-05-27 NOTE — Telephone Encounter (Signed)
Did this patient get Dr. Gerilyn PilgrimSykes' card to call and schedule her appointment while she was here on 05/24/15?

## 2015-05-27 NOTE — Telephone Encounter (Signed)
I have changed the referral. I am trying to ensure that patient can be seen by Wyona AlmasJeannie Sykes. Patient is morbidly obese and pregnant, therefore at high risk for gestational diabetes.

## 2015-05-27 NOTE — Telephone Encounter (Signed)
Yes, I gave her the card and told her to get in contact with Dr. Gerilyn PilgrimSykes.

## 2015-05-27 NOTE — Telephone Encounter (Signed)
[facility] calls stating  they have received a referral for patient with the diagnosis of first trimester pregnancy. They arent sure what MD wants them to do with this referral, also they do not accept referrals with Z codes for the diagnosis. MD either needs to change diagnosis to something more specific (not a Z code) or they will not be able to see patient. Please contact Pam at [phone]

## 2015-06-02 ENCOUNTER — Ambulatory Visit: Payer: BLUE CROSS/BLUE SHIELD | Admitting: Family Medicine

## 2015-06-02 ENCOUNTER — Encounter (HOSPITAL_COMMUNITY): Payer: Self-pay | Admitting: *Deleted

## 2015-06-02 ENCOUNTER — Inpatient Hospital Stay (HOSPITAL_COMMUNITY)
Admission: AD | Admit: 2015-06-02 | Discharge: 2015-06-02 | Disposition: A | Discharge status: Home / Self Care | Payer: BLUE CROSS/BLUE SHIELD | Source: Ambulatory Visit | Attending: Obstetrics & Gynecology | Admitting: Obstetrics & Gynecology

## 2015-06-02 DIAGNOSIS — Z3A10 10 weeks gestation of pregnancy: Secondary | ICD-10-CM | POA: Diagnosis not present

## 2015-06-02 DIAGNOSIS — O23591 Infection of other part of genital tract in pregnancy, first trimester: Secondary | ICD-10-CM

## 2015-06-02 DIAGNOSIS — N760 Acute vaginitis: Secondary | ICD-10-CM

## 2015-06-02 DIAGNOSIS — O26851 Spotting complicating pregnancy, first trimester: Secondary | ICD-10-CM | POA: Diagnosis present

## 2015-06-02 LAB — URINE MICROSCOPIC-ADD ON

## 2015-06-02 LAB — URINALYSIS, ROUTINE W REFLEX MICROSCOPIC
Bilirubin Urine: NEGATIVE
GLUCOSE, UA: NEGATIVE mg/dL
KETONES UR: 15 mg/dL — AB
NITRITE: NEGATIVE
PROTEIN: NEGATIVE mg/dL
Specific Gravity, Urine: 1.025 (ref 1.005–1.030)
pH: 6 (ref 5.0–8.0)

## 2015-06-02 LAB — WET PREP, GENITAL
Clue Cells Wet Prep HPF POC: NONE SEEN
Sperm: NONE SEEN
TRICH WET PREP: NONE SEEN
YEAST WET PREP: NONE SEEN

## 2015-06-02 MED ORDER — TERCONAZOLE 0.4 % VA CREA: 1.0000 | TOPICAL_CREAM | Freq: Every day | VAGINAL | Status: DC

## 2015-06-02 NOTE — MAU Provider Note (Signed)
History     CSN: [id number]  Arrival date and time: 06/02/15 1111   First Provider Initiated Contact with Patient 06/02/15 1250      Chief Complaint  Patient presents with  . Vaginal Bleeding  . Vaginal Discharge   HPI   [name] is a 20 y.o. female G1P0 at [redacted]w[redacted]d presenting to MAU with vaginal spotting. She first noticed the spotting on Monday and it has been off and on since then. The patient is requesting to have an US done today to "check on the baby".   She is currently receiving her care at [facility].   + vaginitis that started around the time the spotting started. She has not tried anything over the counter.   Patient currently being treated for UTI Negative GC 3 weeks ago.   OB History    Gravida Para Term Preterm AB TAB SAB Ectopic Multiple Living   1               Past Medical History  Diagnosis Date  . Medical history non-contributory     Past Surgical History  Procedure Laterality Date  . No past surgeries      No family history on file.  Social History  Substance Use Topics  . Smoking status: Never Smoker   . Smokeless tobacco: None  . Alcohol Use: Yes     Comment: social not since pregnacy    Allergies: No Known Allergies  Prescriptions prior to admission  Medication Sig Dispense Refill Last Dose  . cephALEXin (KEFLEX) 500 MG capsule Take 1 capsule (500 mg total) by mouth 2 (two) times daily. 20 capsule 0 06/02/2015 at Unknown time  . Prenatal Vit-Fe Fumarate-FA (PRENATAL MULTIVITAMIN) TABS tablet Take 1 tablet by mouth daily at 12 noon.   06/02/2015 at Unknown time   Results for orders placed or performed during the hospital encounter of 06/02/15 (from the past 48 hour(s))  Urinalysis, Routine w reflex microscopic (not at [facility])     Status: Abnormal   Collection Time: 06/02/15 11:10 AM  Result Value Ref Range   Color, Urine YELLOW YELLOW   APPearance HAZY (A) CLEAR   Specific Gravity, Urine 1.025 1.005 - 1.030   pH  6.0 5.0 - 8.0   Glucose, UA NEGATIVE NEGATIVE mg/dL   Hgb urine dipstick SMALL (A) NEGATIVE   Bilirubin Urine NEGATIVE NEGATIVE   Ketones, ur 15 (A) NEGATIVE mg/dL   Protein, ur NEGATIVE NEGATIVE mg/dL   Nitrite NEGATIVE NEGATIVE   Leukocytes, UA MODERATE (A) NEGATIVE  Urine microscopic-add on     Status: Abnormal   Collection Time: 06/02/15 11:10 AM  Result Value Ref Range   Squamous Epithelial / LPF 6-30 (A) NONE SEEN   WBC, UA 0-5 0 - 5 WBC/hpf   RBC / HPF 0-5 0 - 5 RBC/hpf   Bacteria, UA FEW (A) NONE SEEN  Wet prep, genital     Status: Abnormal   Collection Time: 06/02/15  1:10 PM  Result Value Ref Range   Yeast Wet Prep HPF POC NONE SEEN NONE SEEN   Trich, Wet Prep NONE SEEN NONE SEEN   Clue Cells Wet Prep HPF POC NONE SEEN NONE SEEN   WBC, Wet Prep HPF POC MANY (A) NONE SEEN    Comment: MANY BACTERIA SEEN   Sperm NONE SEEN    Review of Systems  Constitutional: Negative for fever and chills.  Gastrointestinal: Positive for nausea. Negative for vomiting.  Genitourinary: Negative for dysuria.  Physical Exam   Blood pressure 118/65, pulse 82, temperature 98.6 F (37 C), resp. rate 18, height 5\' 8"  (1.727 m), weight 274 lb 9.6 oz (124.558 kg), last menstrual period 03/20/2015.  Physical Exam  Constitutional: She is oriented to person, place, and time. She appears well-developed and well-nourished. No distress.  HENT:  Head: Normocephalic.  Respiratory: Effort normal.  GI: Soft. She exhibits no distension. There is no tenderness.  Genitourinary:  Speculum exam: Vagina - Small amount of creamy discharge, no odor Cervix - No contact bleeding, small amount of brown mucoid discharge at the os. No active bleeding.  Bimanual exam: Cervix closed, No CMT  Uterus non tender, Enlarged  Adnexa non tender, no masses bilaterally Wet prep done Chaperone present for exam.  Musculoskeletal: Normal range of motion.  Neurological: She is alert and oriented to person, place, and  time.  Skin: Skin is warm. She is not diaphoretic.  Psychiatric: Her behavior is normal.    MAU Course  Procedures  None  MDM  Urine culture pending. Patient currently being treated for a UTI  HR confirmed by bedside US O positive blood type   Assessment and Plan   A:  1. Vaginitis     P:  Discharge home in stable condition RX: Terazol 7 day cream Follow up with OB as scheduled First trimester warning signs   Duane LopeJennifer I Theophile Harvie, NP 06/02/2015 4:30 PM

## 2015-06-02 NOTE — MAU Note (Signed)
Pt reprots she has had some spotting on and off for sevral days. Also c/o vag irritation and possible yeast infection

## 2015-06-02 NOTE — Discharge Instructions (Signed)

## 2015-06-03 LAB — URINE CULTURE

## 2015-06-16 ENCOUNTER — Ambulatory Visit (INDEPENDENT_AMBULATORY_CARE_PROVIDER_SITE_OTHER): Payer: BLUE CROSS/BLUE SHIELD | Admitting: Family Medicine

## 2015-06-16 ENCOUNTER — Encounter: Payer: Self-pay | Admitting: Family Medicine

## 2015-06-16 VITALS — Ht 67.75 in | Wt 269.0 lb

## 2015-06-16 DIAGNOSIS — Z3401 Encounter for supervision of normal first pregnancy, first trimester: Secondary | ICD-10-CM | POA: Diagnosis not present

## 2015-06-16 DIAGNOSIS — Z68.41 Body mass index (BMI) pediatric, greater than or equal to 95th percentile for age: Secondary | ICD-10-CM

## 2015-06-16 DIAGNOSIS — E669 Obesity, unspecified: Secondary | ICD-10-CM

## 2015-06-16 NOTE — Patient Instructions (Signed)
-   TEEN PARENTING MENTORING PROGRAM THROUGH THE [employer]:  Call ASAP to ask about participating.   - TASTE PREFERENCES ARE LEARNED.  This means that it will get easier to choose foods you know are good for you if you are exposed to them enough.   - Minimize fried foods and hydrogenated or trans fat (look for on labels).  - Continue to get a lot of water through the day.   - Review the handout provided today about do's and don'ts of eating for pregnancy.    Main Goals:  1. Eat at least 3 REAL meals and 1-2 snacks per day.  Aim for no more than 5 hours between eating.  Eat breakfast within one hour of getting up.   - A REAL meal includes protein, starch (carboyhdrate), and veg's and/or fruit.  - Vegetables:  Fresh or frozen are best.    - Breakfast ideas:  Yogurt with fruit; toast with cheese; eggs with toast and fruit; high-fiber cereal (aim for 5 grams of fiber per serving).    2. Physical activity: Walk at least 30 minutes 6 days a week.    - Write down the number of minutes you walk each day.

## 2015-06-16 NOTE — Progress Notes (Signed)
Medical Nutrition Therapy:  Appt start time: 1400 end time:  1500.  Assessment:  Primary concerns today: Weight management and pregnancy.  Tina Keller wants to learn how to eat the right amount and kinds of foods for the healthiest pregnancy outcome she can have.   Learning Readiness: Ready  Usual eating pattern includes 2 meals and 1 snacks per day.  Seldom hungry in the  morning.  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  She lives with her mom, and 15-YO sister.  Mom prepares most dinners.   Frequent foods and beverages include water, fried foods, ramen noodles, and yogurt.  Avoided foods include most veg's, deli meats, seafood (1 X wk), & high-Na foods (since pregnant), most beans (disliked).   Usual physical activity includes none, but does walk to store/park sometimes.  24-hr recall: (Up at 10:30 AM) B ( AM)-   water Snk ( AM)-   water L (1 PM)-  Water, 1 slc meatloaf, 1 c inst mashed pot's, 1/2 c mac&chs Snk (10:30)-  4-5 Saltines, water  D (11 PM)-  2 c noodles with 2 oz chx and 1/4 c peas, carrots, & potato, water Snk ( PM)-   Typical day? Yes.    Progress Towards Goal(s):  In progress.   Nutritional Diagnosis:  NI-5.8.3 Inappropriate intake of types of carbohydrates (specify):   As related to starch foods vs. veg's or fruit.  As evidenced by usual intake of starchy foods, but little or no fruit/veg's daily.    Intervention:  Nutrition education.  Handouts given during visit include:  AVS  Nutrition and Pregnancy (for adolescents)   Demonstrated degree of understanding via:  Teach Back  Barriers to learning/adherence to lifestyle change: Longstanding habits: Frequent intake of fried foods; absence of veg's; no breakfast.    Monitoring/Evaluation:  Dietary intake, exercise, and body weight in 4 week(s).

## 2015-06-17 ENCOUNTER — Encounter (HOSPITAL_COMMUNITY): Payer: Self-pay

## 2015-06-17 ENCOUNTER — Inpatient Hospital Stay (HOSPITAL_COMMUNITY)
Admission: AD | Admit: 2015-06-17 | Discharge: 2015-06-17 | Disposition: A | Discharge status: Home / Self Care | Payer: BLUE CROSS/BLUE SHIELD | Source: Ambulatory Visit | Attending: Obstetrics & Gynecology | Admitting: Obstetrics & Gynecology

## 2015-06-17 DIAGNOSIS — O4691 Antepartum hemorrhage, unspecified, first trimester: Secondary | ICD-10-CM | POA: Diagnosis not present

## 2015-06-17 DIAGNOSIS — N939 Abnormal uterine and vaginal bleeding, unspecified: Secondary | ICD-10-CM | POA: Insufficient documentation

## 2015-06-17 DIAGNOSIS — O209 Hemorrhage in early pregnancy, unspecified: Secondary | ICD-10-CM

## 2015-06-17 DIAGNOSIS — Z3A12 12 weeks gestation of pregnancy: Secondary | ICD-10-CM | POA: Diagnosis not present

## 2015-06-17 LAB — CBC
HEMATOCRIT: 35.8 % — AB (ref 36.0–46.0)
HEMOGLOBIN: 12.1 g/dL (ref 12.0–15.0)
MCH: 27 pg (ref 26.0–34.0)
MCHC: 33.8 g/dL (ref 30.0–36.0)
MCV: 79.9 fL (ref 78.0–100.0)
Platelets: 233 10*3/uL (ref 150–400)
RBC: 4.48 MIL/uL (ref 3.87–5.11)
RDW: 14.1 % (ref 11.5–15.5)
WBC: 7.7 10*3/uL (ref 4.0–10.5)

## 2015-06-17 NOTE — MAU Provider Note (Signed)
Chief Complaint: Vaginal Bleeding   None     SUBJECTIVE HPI: Tina Keller is a 20 y.o. G1P0 at [redacted]w[redacted]d by LMP who presents to maternity admissions reporting onset of brown bleeding with one small clot noted at 3 am today then some brown spotting afterwards.  The bleeding has stopped by the time she arrived in MAU, and she did not see bleeding when wiping in the bathroom upon arrival.  She has not tried any treatments but the bleeding has resolved without treatment.  She reports she was more active yesterday and walked more than usual and lifted some cases of soda for her aunt.  She reports her pregnancy has been uncomplicated so far, with her next visit scheduled for first screen/NT [place] next Friday.   She denies abdominal pain, vaginal itching/burning, urinary symptoms, h/a, dizziness, n/v, or fever/chills.     HPI  Past Medical History  Diagnosis Date  . Medical history non-contributory    Past Surgical History  Procedure Laterality Date  . No past surgeries     Social History   Social History  . Marital Status: Single    Spouse Name: N/A  . Number of Children: N/A  . Years of Education: N/A   Occupational History  . Not on file.   Social History Main Topics  . Smoking status: Never Smoker   . Smokeless tobacco: Not on file  . Alcohol Use: Yes     Comment: social not since pregnacy  . Drug Use: No  . Sexual Activity: Yes    Birth Control/ Protection: None   Other Topics Concern  . Not on file   Social History Narrative   No current facility-administered medications on file prior to encounter.   Current Outpatient Prescriptions on File Prior to Encounter  Medication Sig Dispense Refill  . Prenatal Vit-Fe Fumarate-FA (PRENATAL MULTIVITAMIN) TABS tablet Take 1 tablet by mouth daily at 12 noon.    . promethazine (PHENERGAN) 12.5 MG tablet Take 12.5 mg by mouth every 6 (six) hours as needed for nausea or vomiting.     No Known Allergies  ROS:  Review of Systems   Constitutional: Negative for fever, chills and fatigue.  Respiratory: Negative for shortness of breath.   Cardiovascular: Negative for chest pain.  Genitourinary: Positive for vaginal bleeding. Negative for dysuria, flank pain, vaginal discharge, difficulty urinating, vaginal pain and pelvic pain.  Neurological: Negative for dizziness and headaches.  Psychiatric/Behavioral: Negative.      I have reviewed patient's Past Medical Hx, Surgical Hx, Family Hx, Social Hx, medications and allergies.   Physical Exam  Patient Vitals for the past 24 hrs:  BP Temp Temp src Pulse Resp  06/17/15 1503 125/57 mmHg 98.7 F (37.1 C) Oral 82 18   Constitutional: Well-developed, well-nourished female in no acute distress.  Cardiovascular: normal rate Respiratory: normal effort GI: Abd soft, non-tender. Pos BS x 4 MS: Extremities nontender, no edema, normal ROM Neurologic: Alert and oriented x 4.  GU: Neg CVAT.  PELVIC EXAM: Cervix pink, visually closed, without lesion, small amount brown bleeding, no active bleeding noted, vaginal walls and external genitalia normal Bimanual exam: Cervix 0/long/high, firm, anterior, neg CMT, uterus nontender, ~ 12 week size, adnexa without tenderness, enlargement, or mass  FHT 153 by doppler  LAB RESULTS Results for orders placed or performed during the hospital encounter of 06/17/15 (from the past 24 hour(s))  CBC     Status: Abnormal   Collection Time: 06/17/15  2:53 PM  Result Value  Ref Range   WBC 7.7 4.0 - 10.5 K/uL   RBC 4.48 3.87 - 5.11 MIL/uL   Hemoglobin 12.1 12.0 - 15.0 g/dL   HCT 16.135.8 (L) 09.636.0 - 04.546.0 %   MCV 79.9 78.0 - 100.0 fL   MCH 27.0 26.0 - 34.0 pg   MCHC 33.8 30.0 - 36.0 g/dL   RDW 40.914.1 81.111.5 - 91.415.5 %   Platelets 233 150 - 400 K/uL    O/POS/-- (03/31 1427)  IMAGING No results found.  MAU Management/MDM: Ordered labs and reviewed results.  Confirmed IUP on previous visits.  With normal FHT and minimal bleeding in MAU, reassurance  provided to pt that bleeding is likely from cervix r/t activity.  Pt to f/u with prenatal care as planned. Bleeding precautions given/reasons to return to MAU.  Pt stable at time of discharge.  ASSESSMENT 1. Vaginal bleeding in pregnancy, first trimester     PLAN Discharge home with bleeding precautions    Medication List    TAKE these medications        prenatal multivitamin Tabs tablet  Take 1 tablet by mouth daily at 12 noon.     promethazine 12.5 MG tablet  Commonly known as:  PHENERGAN  Take 12.5 mg by mouth every 6 (six) hours as needed for nausea or vomiting.           Follow-up Information    [facility]   Specialty:  Family Medicine   Why:  As scheduled, Return to MAU as needed for emergencies   Contact information:   [address] [id number] mc [place] [id number] [phone]      Sharen CounterLisa Leftwich-Kirby Certified Nurse-Midwife 06/17/2015  3:52 PM

## 2015-06-17 NOTE — MAU Note (Signed)
Urine in lab 

## 2015-06-17 NOTE — Discharge Instructions (Signed)

## 2015-06-17 NOTE — MAU Note (Signed)
Patient states passed a brown clot this morning, then had some bleeding this morning, none at present.

## 2015-06-24 ENCOUNTER — Ambulatory Visit (INDEPENDENT_AMBULATORY_CARE_PROVIDER_SITE_OTHER): Payer: BLUE CROSS/BLUE SHIELD | Admitting: Internal Medicine

## 2015-06-24 ENCOUNTER — Encounter: Payer: Self-pay | Admitting: Internal Medicine

## 2015-06-24 VITALS — BP 132/62 | HR 84 | Temp 98.7°F | Wt 265.5 lb

## 2015-06-24 DIAGNOSIS — Z3491 Encounter for supervision of normal pregnancy, unspecified, first trimester: Secondary | ICD-10-CM

## 2015-06-24 MED ORDER — VITAMIN B-6 25 MG PO TABS: 25.0000 mg | ORAL_TABLET | Freq: Three times a day (TID) | ORAL | Status: DC | PRN

## 2015-06-24 NOTE — Progress Notes (Signed)
[name] is a 20 y.o. G1P0 at 1671w5d for routine follow up.  She reports nausea, denies any vomiting until this morning. See flow sheet for details.  Pregnancy issues include patient being seen at the MAU for 1 day hx of bleeding. Notes from the MAU were reviewed. During visit, patient was noted to have normal FHT and minimal bleeding in MAU. Cervix was closed and high at that visit.   Physical Exam  General:Gravida female, NAD  Abdomen Exam: Gravida, uterus 12 cm, no ttp Extremities: No lower extremity edema  FHR: 158, Ultrasound showing intrauterine pregnancy   A/P: Pregnancy at 6271w5d.  Doing well.   Pregnancy issues MAU visit discussed above, reviewed precautions, FHR noted at this visit   Anatomy ultrasound ordered to be scheduled at 18-19 weeks. Pt  is interested in genetic screening, will provide Quad screen at 17- 18 weeks  Bleeding and pain precautions reviewed.  Follow up 4 weeks. For nausea provided pyridoxine 25 mg every 8 hours

## 2015-06-24 NOTE — Patient Instructions (Signed)

## 2015-07-14 ENCOUNTER — Encounter: Payer: BLUE CROSS/BLUE SHIELD | Admitting: Family Medicine

## 2015-07-14 ENCOUNTER — Ambulatory Visit (INDEPENDENT_AMBULATORY_CARE_PROVIDER_SITE_OTHER): Payer: BLUE CROSS/BLUE SHIELD | Admitting: Family Medicine

## 2015-07-14 ENCOUNTER — Encounter: Payer: Self-pay | Admitting: Family Medicine

## 2015-07-14 ENCOUNTER — Other Ambulatory Visit: Payer: BLUE CROSS/BLUE SHIELD

## 2015-07-14 VITALS — BP 117/66 | HR 90 | Ht 66.0 in | Wt 263.0 lb

## 2015-07-14 DIAGNOSIS — Z68.41 Body mass index (BMI) pediatric, greater than or equal to 95th percentile for age: Secondary | ICD-10-CM

## 2015-07-14 DIAGNOSIS — Z3491 Encounter for supervision of normal pregnancy, unspecified, first trimester: Secondary | ICD-10-CM

## 2015-07-14 DIAGNOSIS — Z3401 Encounter for supervision of normal first pregnancy, first trimester: Secondary | ICD-10-CM

## 2015-07-14 NOTE — Progress Notes (Signed)
Patient ID: Tina PotterJasmine Keller, female   DOB: 11/27/1995, 20 y.o.   MRN: [id number] Patient seen at the nutritionist clinic and she mentioned to Dr Gerilyn PilgrimSykes hx of epigastric pain recently at home which improved with vomiting. Dr. Gerilyn PilgrimSykes precepted her and I recommended that she be seen today. She was put on Dr. Valorie RooseveltMcIntyre's schedule. Later Dr. Pollie MeyerMcIntyre told me patient is no where to be found. The nurse called her to be roomed but she was gone. I tried to call her to check on her but her phone did not go through and I was unable to leave a message.   I called her back again on same number [phone] (M), her mother picked up and said she is not home. She also does not currently have a cell phone per her mother.Message left for her to call our clinic as soon as she gets back home. Mom verbalized understanding.

## 2015-07-14 NOTE — Patient Instructions (Addendum)
-   Orange juice (even with calcium and vitamin D) is NOT equivalent to milk.  Milk has a lot of high-quality protein.  Juice has none.  Juice is a concentrated source of both sugar and calories.  Recommendation:  Use Ovaltine (lower sugar than [place]quik) 1 serving size (2 tablespoons) in 12 oz of milk instead for a drink that provides a LOT more protein, good calcium, some vitamin D, and less sugar than any juice. - Blood pressure: Please keep in mind that the best way to eat for blood pressure management is to keep your salt (sodium) intake low, and TO GET PLENTY OF FRUITS AND VEG'S b/c they are good sources of potassium.     - Great job on your food and exercise goals! - Ways to improve: 1. Increase eating frequency to include at least a couple of snacks each day.    - Snack foods:  Fresh fruit; cheese & crackers; string cheese with fruit or nuts (best is unsalted); 1/2 sandwich or 1/2 pita or English muffin with melted cheese; yogurt with fruit; cereal (best has at least 5 g of fiber) with milk.   2. Go no more than 4-5 hours without eating.  This will require advance planning.  Think about the WHERE, WHEN, and WHAT of meals and snacks:  For example: Where will I be at lunch time tomorrow?  When will it be feasible for me to eat lunch?  What will I have? 3. Increase vegetable intake to at least one serving a day.  (Try sauteeing greens in olive oil and garlic.  This is VERY quick; should take no more than 5 min max!)  Dark leafy greens are also a good source of calcium.  Remember that taste preferences are learned.  You will start to like MORE veg's if you work on getting more into your diet.  Another way to try some new veg's is to ROAST them, i.e., spray with olive oil, and bake in HOT oven (~400 degrees).  Roast with onion and garlic, and watch carefully so they don't burn.  Other veg's that work well for roasting:  Zucchini, yellow squash, cherry tomatoes, cauliflower, broccoli (including stem that is  peeled, carrots, turnips.

## 2015-07-14 NOTE — Progress Notes (Signed)
Medical Nutrition Therapy:  Appt start time: 1430 end time:  1530.  Assessment:  Primary concerns today: Weight management and pregnancy.   Tina Keller has been walking ~20 min 5 X wk.  She has been trying to eat with regularity through the day, using mainly fruit for snacks.  She is now working 12-5 PM 5 days a week.  [name] has had NO fried foods.  She has been eating some greens; estimates she is eating a veg serving 3-4 X wk.  Has had no nausea the past couple of weeks with exception of episode on Monday morning:  She was wakened with severe abdominal/chest pain on Monday, which she feared was a heart attack.  As the pain increased, she vomited, which happened 3 or 4 times.  The pain dissipated after about an hour, so she did not seek medical help at that time, but is interested in speaking with a physician about this today, if possible.    24-hr recall:  (Up at 10:30 AM) B (10:45 AM)-  1 1/2 c chx & rice, 1 apple, water Snk ( AM)-  --- L (3 PM)-  1 chsburger, 1 hotdog w/ chili & onions, 2 tangerines, water  Snk ( PM)-  --- D (9:30 PM)-  2 chx drumsticks, 1/2 , mac&chs, water Snk ( PM)-  --- Typical day? Yes.    Progress Towards Goal(s):  In progress.   Nutritional Diagnosis:  Progress noted on: NI-5.8.3 Inappropriate intake of types of carbohydrates (specify):   As related to starch foods vs. veg's or fruit.  As evidenced by increased intake of fruit to most days of the week, and veg's to 3-4 X wk.    Intervention:  Nutrition education.  Handouts given during visit include:  AVS  Demonstrated degree of understanding via:  Teach Back  Barriers to learning/adherence to lifestyle change: Longstanding habits: Frequent intake of fried foods; absence of veg's; no breakfast.    Monitoring/Evaluation:  Dietary intake, exercise, and body weight in 4 week(s).

## 2015-07-15 ENCOUNTER — Ambulatory Visit: Payer: BLUE CROSS/BLUE SHIELD | Admitting: Family Medicine

## 2015-07-20 ENCOUNTER — Encounter (HOSPITAL_COMMUNITY): Payer: Self-pay | Admitting: Internal Medicine

## 2015-07-22 ENCOUNTER — Ambulatory Visit (INDEPENDENT_AMBULATORY_CARE_PROVIDER_SITE_OTHER): Payer: BLUE CROSS/BLUE SHIELD | Admitting: Family Medicine

## 2015-07-22 ENCOUNTER — Ambulatory Visit (HOSPITAL_COMMUNITY)
Admission: RE | Admit: 2015-07-22 | Discharge: 2015-07-22 | Disposition: A | Discharge status: Home / Self Care | Payer: BLUE CROSS/BLUE SHIELD | Source: Ambulatory Visit | Attending: Family Medicine | Admitting: Family Medicine

## 2015-07-22 ENCOUNTER — Telehealth: Payer: Self-pay | Admitting: Family Medicine

## 2015-07-22 ENCOUNTER — Encounter: Payer: BLUE CROSS/BLUE SHIELD | Admitting: Internal Medicine

## 2015-07-22 ENCOUNTER — Encounter: Payer: Self-pay | Admitting: Family Medicine

## 2015-07-22 VITALS — BP 130/70 | HR 87 | Temp 99.3°F | Wt 265.0 lb

## 2015-07-22 DIAGNOSIS — R9431 Abnormal electrocardiogram [ECG] [EKG]: Secondary | ICD-10-CM | POA: Insufficient documentation

## 2015-07-22 DIAGNOSIS — R8271 Bacteriuria: Secondary | ICD-10-CM

## 2015-07-22 DIAGNOSIS — R079 Chest pain, unspecified: Secondary | ICD-10-CM

## 2015-07-22 DIAGNOSIS — Z3402 Encounter for supervision of normal first pregnancy, second trimester: Secondary | ICD-10-CM

## 2015-07-22 NOTE — Telephone Encounter (Signed)
Attempted to reach patient to discuss urine culture. Needs repeat OB urine culture (especially as she endorsed frequent urination to me today). We did not obtain this prior to her leaving today.  Mom answered and said she was not at home with patient right now. Asked her to have patient call her doctors office on Monday.  When patient returns call please tell her that I need her to come in and give another urine sample. Order entered for UA & OB urine culture  FYI to prenatal care provider Dr. Cathlean CowerMikell.  Latrelle DodrillBrittany J Kreig Parson, MD

## 2015-07-22 NOTE — Progress Notes (Signed)
BP recheck 120/72. Nayara Taplin Bruna PotterBlount, CMA

## 2015-07-22 NOTE — Patient Instructions (Addendum)
If you have any cramping/contractions, vaginal bleeding, or fluid leaking, go immediately to [facility] to be evaluated.   Due date is December 25, 2015  I think the chest pain was probably acid reflux Okay to take tums if needed if this happens again  Next visit in 4 weeks with Dr. Cathlean Cower Return in 2 weeks for a nurse visit to recheck your blood pressure   Call with questions any time  Be well, Dr. Pollie Meyer    Second Trimester of Pregnancy The second trimester is from week 13 through week 28, months 4 through 6. The second trimester is often a time when you feel your best. Your body has also adjusted to being pregnant, and you begin to feel better physically. Usually, morning sickness has lessened or quit completely, you may have more energy, and you may have an increase in appetite. The second trimester is also a time when the fetus is growing rapidly. At the end of the sixth month, the fetus is about 9 inches long and weighs about 1 pounds. You will likely begin to feel the baby move (quickening) between 18 and 20 weeks of the pregnancy. BODY CHANGES Your body goes through many changes during pregnancy. The changes vary from woman to woman.   Your weight will continue to increase. You will notice your lower abdomen bulging out.  You may begin to get stretch marks on your hips, abdomen, and breasts.  You may develop headaches that can be relieved by medicines approved by your health care provider.  You may urinate more often because the fetus is pressing on your bladder.  You may develop or continue to have heartburn as a result of your pregnancy.  You may develop constipation because certain hormones are causing the muscles that push waste through your intestines to slow down.  You may develop hemorrhoids or swollen, bulging veins (varicose veins).  You may have back pain because of the weight gain and pregnancy hormones relaxing your joints between the bones in your  pelvis and as a result of a shift in weight and the muscles that support your balance.  Your breasts will continue to grow and be tender.  Your gums may bleed and may be sensitive to brushing and flossing.  Dark spots or blotches (chloasma, mask of pregnancy) may develop on your face. This will likely fade after the baby is born.  A dark line from your belly button to the pubic area (linea nigra) may appear. This will likely fade after the baby is born.  You may have changes in your hair. These can include thickening of your hair, rapid growth, and changes in texture. Some women also have hair loss during or after pregnancy, or hair that feels dry or thin. Your hair will most likely return to normal after your baby is born. WHAT TO EXPECT AT YOUR PRENATAL VISITS During a routine prenatal visit:  You will be weighed to make sure you and the fetus are growing normally.  Your blood pressure will be taken.  Your abdomen will be measured to track your baby's growth.  The fetal heartbeat will be listened to.  Any test results from the previous visit will be discussed. Your health care provider may ask you:  How you are feeling.  If you are feeling the baby move.  If you have had any abnormal symptoms, such as leaking fluid, bleeding, severe headaches, or abdominal cramping.  If you are using any tobacco products, including cigarettes, chewing tobacco,  and electronic cigarettes.  If you have any questions. Other tests that may be performed during your second trimester include:  Blood tests that check for:  Low iron levels (anemia).  Gestational diabetes (between 24 and 28 weeks).  Rh antibodies.  Urine tests to check for infections, diabetes, or protein in the urine.  An ultrasound to confirm the proper growth and development of the baby.  An amniocentesis to check for possible genetic problems.  Fetal screens for spina bifida and Down syndrome.  HIV (human  immunodeficiency virus) testing. Routine prenatal testing includes screening for HIV, unless you choose not to have this test. HOME CARE INSTRUCTIONS   Avoid all smoking, herbs, alcohol, and unprescribed drugs. These chemicals affect the formation and growth of the baby.  Do not use any tobacco products, including cigarettes, chewing tobacco, and electronic cigarettes. If you need help quitting, ask your health care provider. You may receive counseling support and other resources to help you quit.  Follow your health care provider's instructions regarding medicine use. There are medicines that are either safe or unsafe to take during pregnancy.  Exercise only as directed by your health care provider. Experiencing uterine cramps is a good sign to stop exercising.  Continue to eat regular, healthy meals.  Wear a good support bra for breast tenderness.  Do not use hot tubs, steam rooms, or saunas.  Wear your seat belt at all times when driving.  Avoid raw meat, uncooked cheese, cat litter boxes, and soil used by cats. These carry germs that can cause birth defects in the baby.  Take your prenatal vitamins.  Take 1500-2000 mg of calcium daily starting at the 20th week of pregnancy until you deliver your baby.  Try taking a stool softener (if your health care provider approves) if you develop constipation. Eat more high-fiber foods, such as fresh vegetables or fruit and whole grains. Drink plenty of fluids to keep your urine clear or pale yellow.  Take warm sitz baths to soothe any pain or discomfort caused by hemorrhoids. Use hemorrhoid cream if your health care provider approves.  If you develop varicose veins, wear support hose. Elevate your feet for 15 minutes, 3-4 times a day. Limit salt in your diet.  Avoid heavy lifting, wear low heel shoes, and practice good posture.  Rest with your legs elevated if you have leg cramps or low back pain.  Visit your dentist if you have not gone  yet during your pregnancy. Use a soft toothbrush to brush your teeth and be gentle when you floss.  A sexual relationship may be continued unless your health care provider directs you otherwise.  Continue to go to all your prenatal visits as directed by your health care provider. SEEK MEDICAL CARE IF:   You have dizziness.  You have mild pelvic cramps, pelvic pressure, or nagging pain in the abdominal area.  You have persistent nausea, vomiting, or diarrhea.  You have a bad smelling vaginal discharge.  You have pain with urination. SEEK IMMEDIATE MEDICAL CARE IF:   You have a fever.  You are leaking fluid from your vagina.  You have spotting or bleeding from your vagina.  You have severe abdominal cramping or pain.  You have rapid weight gain or loss.  You have shortness of breath with chest pain.  You notice sudden or extreme swelling of your face, hands, ankles, feet, or legs.  You have not felt your baby move in over an hour.  You have severe headaches  that do not go away with medicine.  You have vision changes.   This information is not intended to replace advice given to you by your health care provider. Make sure you discuss any questions you have with your health care provider.   Document Released: 01/16/2001 Document Revised: 02/12/2014 Document Reviewed: 03/25/2012 Elsevier Interactive Patient Education [employer]2016 Elsevier Inc.

## 2015-07-22 NOTE — Progress Notes (Signed)
Tina PotterJasmine Keller is a 20 y.o. G1P0 at 1826w5d via (L=446w5d u/s) here for routine follow up.  She reports round ligament pain. Denies cramping/ctx, fluid leaking, vaginal bleeding. Has felt small flutters that she thinks is baby moving. She is taking prenatal vitamin.  Last week she had an episode of severe chest pain lasting about 1 hour. Vomited 4 times during that time. Felt shortness of breath. Was worse with exertion. No radiation. Located mid sternum just above epigastric area. Went away spontaneously after 1 hour.  See flow sheet for details.  Exam: Gen: NAD, pleasant, cooperative Heart: regular rate and rhythm, no murmur Lungs: clear to auscultation bilaterally, normal work of breathing. No chest wall tenderness. Abd: soft, nontender to palpation Ext: no appreciable lower extremity edema bilaterally Neuro: grossly nonfocal, speech intact  A/P: Pregnancy at 5526w5d.  Doing well.    Pregnancy issues include: -presently size > dates - measuring 22cm. Low actual concern for true size date discrepancy given early gestational age and known obesity, which limit reliability of fundal height. Has upcoming anatomy scan scheduled which will further assess fetal growth. -chest pain - spontaneously resolved. EKG performed today and shows no signs of acute ischemia. Low likelihood of cardiac issues in this otherwise young, healthy patient. Suspect chest pain was acid reflux. Recommend trial of tums if it recurs.  -obesity - no weight gain since last visit. Continue to follow up with Dr. Gerilyn PilgrimSykes & monitor weight gain. -BP borderline today at 130/70 (improved on recheck though), was 132/62 at last visit. Will need cautious attention to blood pressures as pregnancy progresses. -GBS positive - detected in urine on initial OB culture. Will need treatment in labor. Noted after visit that patient has not had documented sterile urine after treatment with keflex (subsequent culture on 4/27 collected in MAU and has  multiple species present).  Recommend she return for another OB urine culture. See phone note for details.  Genetic screening - patient had quad screen drawn 6/8 but result is not back. Confirmed this with Molly Maduroobert in lab. Advised patient it takes a few weeks to return.  Bleeding and pain precautions reviewed. Follow up 2 weeks for blood pressure check with nurse. Next MD visit in 4 weeks.

## 2015-07-25 ENCOUNTER — Other Ambulatory Visit (INDEPENDENT_AMBULATORY_CARE_PROVIDER_SITE_OTHER): Payer: BLUE CROSS/BLUE SHIELD

## 2015-07-25 DIAGNOSIS — R8271 Bacteriuria: Secondary | ICD-10-CM

## 2015-07-25 LAB — POCT URINALYSIS DIPSTICK
Bilirubin, UA: NEGATIVE
Glucose, UA: NEGATIVE
Ketones, UA: NEGATIVE
NITRITE UA: NEGATIVE
PH UA: 7
PROTEIN UA: 30
RBC UA: NEGATIVE
Spec Grav, UA: 1.015
UROBILINOGEN UA: 0.2

## 2015-07-27 ENCOUNTER — Ambulatory Visit (HOSPITAL_COMMUNITY)
Admission: RE | Admit: 2015-07-27 | Discharge: 2015-07-27 | Disposition: A | Discharge status: Home / Self Care | Payer: BLUE CROSS/BLUE SHIELD | Source: Ambulatory Visit | Attending: Family Medicine | Admitting: Family Medicine

## 2015-07-27 DIAGNOSIS — Z36 Encounter for antenatal screening of mother: Secondary | ICD-10-CM | POA: Diagnosis not present

## 2015-07-27 DIAGNOSIS — Z3A17 17 weeks gestation of pregnancy: Secondary | ICD-10-CM | POA: Insufficient documentation

## 2015-07-27 DIAGNOSIS — Z3492 Encounter for supervision of normal pregnancy, unspecified, second trimester: Secondary | ICD-10-CM | POA: Diagnosis present

## 2015-07-27 DIAGNOSIS — O99212 Obesity complicating pregnancy, second trimester: Secondary | ICD-10-CM | POA: Insufficient documentation

## 2015-07-27 DIAGNOSIS — Z3491 Encounter for supervision of normal pregnancy, unspecified, first trimester: Secondary | ICD-10-CM

## 2015-07-28 ENCOUNTER — Telehealth: Payer: Self-pay | Admitting: Family Medicine

## 2015-07-28 ENCOUNTER — Telehealth: Payer: Self-pay | Admitting: Internal Medicine

## 2015-07-28 DIAGNOSIS — R8271 Bacteriuria: Secondary | ICD-10-CM

## 2015-07-28 DIAGNOSIS — O1210 Gestational proteinuria, unspecified trimester: Secondary | ICD-10-CM

## 2015-07-28 LAB — CULTURE, OB URINE

## 2015-07-28 MED ORDER — CEPHALEXIN 500 MG PO CAPS: 500.0000 mg | ORAL_CAPSULE | Freq: Two times a day (BID) | ORAL | Status: DC

## 2015-07-28 NOTE — Telephone Encounter (Signed)
I spoke with patient. She was asking about result for her Quad screen done June 8th ordered by Dr. Cathlean CowerMikell. Unfortunately the result is still not available. I checked on Epic but it was not there. Patient informed she would be contacted soon by ordering physician once test result is available.  NB: Dr. Cathlean CowerMikell might have gotten result and submitted it for scanning. If that is the case it will be best to let her know patient wants to know about test result so she can contact patient directly.   Thanks.  I will also forward a copy to Dr. Cathlean CowerMikell

## 2015-07-28 NOTE — Telephone Encounter (Signed)
Called patient to let her know the results of the quad screen. She was not available. Therefore, left a message with patient letting her know quad screen was negative and there was not any increased risk for down syndrome.   Tina Keller.

## 2015-07-28 NOTE — Telephone Encounter (Signed)
Patient requesting Anatomy scan results and other lab results from 07/22/15. Please call patient. [facility].

## 2015-07-28 NOTE — Telephone Encounter (Signed)
Called patient to discuss urine results. She is currently 6583w4d.  She recurrent GBS bacteruria in pregnancy, which is a risk factor for preterm labor & premature rupture of membranes. She also has mild proteinuria on the urine dipstick (30). Needs treatment for recurrent GBS bacteriuria & close monitoring of protein.  Plan: -Rx keflex 500mg  twice daily for 5 days -Return in 7 days for blood pressure check & lab visit (urinalysis, OB urine culture, urine protein:creatinine ratio, CMET, CBC)  Patient agreeable. Labs ordered & rx sent in. FYI to Dr. Cathlean CowerMikell (primary prenatal provider).  Latrelle DodrillBrittany J McIntyre, MD

## 2015-07-28 NOTE — Telephone Encounter (Signed)
Will forward to Dr. Pollie MeyerMcIntyre to review. Emmit Oriley,CMA

## 2015-07-28 NOTE — Telephone Encounter (Signed)
Ideally message should be sent to the physician who ordered and saw patient for prenatal care but I will review result and discuss with patient. Thank you.

## 2015-08-01 ENCOUNTER — Telehealth: Payer: Self-pay | Admitting: Family Medicine

## 2015-08-01 NOTE — Telephone Encounter (Signed)
Pt is calling to get the results of her ultra sound that was done last week. Dr. [facility]. jw

## 2015-08-01 NOTE — Telephone Encounter (Signed)
Will forward to Dr. Mikell. Dorathea Faerber,CMA  

## 2015-08-02 NOTE — Telephone Encounter (Signed)
Patient called our office again requesting OB U/S results.  Will page Dr. Cathlean CowerMikell and call patient back with results.  Altamese Dilling~Jeannette Richardson, BSN, RN-BC

## 2015-08-04 ENCOUNTER — Encounter: Payer: Self-pay | Admitting: Family Medicine

## 2015-08-04 ENCOUNTER — Other Ambulatory Visit (INDEPENDENT_AMBULATORY_CARE_PROVIDER_SITE_OTHER): Payer: BLUE CROSS/BLUE SHIELD

## 2015-08-04 ENCOUNTER — Other Ambulatory Visit (HOSPITAL_COMMUNITY)
Admission: RE | Admit: 2015-08-04 | Discharge: 2015-08-04 | Disposition: A | Discharge status: Home / Self Care | Payer: BLUE CROSS/BLUE SHIELD | Source: Ambulatory Visit | Attending: Family Medicine | Admitting: Family Medicine

## 2015-08-04 ENCOUNTER — Ambulatory Visit (INDEPENDENT_AMBULATORY_CARE_PROVIDER_SITE_OTHER): Payer: BLUE CROSS/BLUE SHIELD | Admitting: Family Medicine

## 2015-08-04 ENCOUNTER — Ambulatory Visit (INDEPENDENT_AMBULATORY_CARE_PROVIDER_SITE_OTHER): Payer: BLUE CROSS/BLUE SHIELD | Admitting: *Deleted

## 2015-08-04 VITALS — BP 112/70 | HR 90 | Temp 98.2°F | Ht 66.0 in | Wt 266.0 lb

## 2015-08-04 DIAGNOSIS — O26892 Other specified pregnancy related conditions, second trimester: Secondary | ICD-10-CM

## 2015-08-04 DIAGNOSIS — O1210 Gestational proteinuria, unspecified trimester: Secondary | ICD-10-CM | POA: Diagnosis not present

## 2015-08-04 DIAGNOSIS — R8271 Bacteriuria: Secondary | ICD-10-CM

## 2015-08-04 DIAGNOSIS — Z113 Encounter for screening for infections with a predominantly sexual mode of transmission: Secondary | ICD-10-CM | POA: Insufficient documentation

## 2015-08-04 DIAGNOSIS — N898 Other specified noninflammatory disorders of vagina: Secondary | ICD-10-CM | POA: Diagnosis not present

## 2015-08-04 DIAGNOSIS — J302 Other seasonal allergic rhinitis: Secondary | ICD-10-CM

## 2015-08-04 DIAGNOSIS — B373 Candidiasis of vulva and vagina: Secondary | ICD-10-CM | POA: Diagnosis not present

## 2015-08-04 DIAGNOSIS — B3731 Acute candidiasis of vulva and vagina: Secondary | ICD-10-CM | POA: Insufficient documentation

## 2015-08-04 LAB — COMPLETE METABOLIC PANEL WITH GFR
ALBUMIN: 3.4 g/dL — AB (ref 3.6–5.1)
ALK PHOS: 37 U/L — AB (ref 47–176)
ALT: 17 U/L (ref 5–32)
AST: 14 U/L (ref 12–32)
BILIRUBIN TOTAL: 0.3 mg/dL (ref 0.2–1.1)
BUN: 10 mg/dL (ref 7–20)
CALCIUM: 9.1 mg/dL (ref 8.9–10.4)
CO2: 20 mmol/L (ref 20–31)
CREATININE: 0.56 mg/dL (ref 0.50–1.00)
Chloride: 107 mmol/L (ref 98–110)
GFR, Est Non African American: >89 mL/min (ref >=60)
Glucose, Bld: 85 mg/dL (ref 65–99)
Potassium: 3.8 mmol/L (ref 3.8–5.1)
Sodium: 138 mmol/L (ref 135–146)
Total Protein: 6.1 g/dL — ABNORMAL LOW (ref 6.3–8.2)

## 2015-08-04 LAB — POCT URINALYSIS DIPSTICK
BILIRUBIN UA: NEGATIVE
GLUCOSE UA: NEGATIVE
KETONES UA: 15
NITRITE UA: NEGATIVE
Protein, UA: 30
Spec Grav, UA: >=1.03
Urobilinogen, UA: 0.2
pH, UA: 6

## 2015-08-04 LAB — POCT WET PREP (WET MOUNT): Clue Cells Wet Prep Whiff POC: NEGATIVE

## 2015-08-04 LAB — CBC
HCT: 35 % (ref 35.0–45.0)
Hemoglobin: 11.5 g/dL — ABNORMAL LOW (ref 11.7–15.5)
MCH: 27.2 pg (ref 27.0–33.0)
MCHC: 32.9 g/dL (ref 32.0–36.0)
MCV: 82.7 fL (ref 80.0–100.0)
MPV: 10.8 fL (ref 7.5–12.5)
PLATELETS: 260 10*3/uL (ref 140–400)
RBC: 4.23 MIL/uL (ref 3.80–5.10)
RDW: 14.5 % (ref 11.0–15.0)
WBC: 8.4 10*3/uL (ref 3.8–10.8)

## 2015-08-04 LAB — POCT UA - MICROSCOPIC ONLY: Epithelial cells, urine per micros: >20

## 2015-08-04 MED ORDER — FLUCONAZOLE 150 MG PO TABS: 150.0000 mg | ORAL_TABLET | Freq: Once | ORAL | Status: DC

## 2015-08-04 NOTE — Patient Instructions (Signed)
It was nice meeting you today Tina Keller!  Your vaginal discharge is due to a yeast infection. Please take the medication (diflucan) once today. This single pill should cure your yeast infection. We will call you if there are any abnormalities with your other lab work.   If you continue to have a lot of vaginal discharge and irritation, please call the office to schedule another appointment.   If you notice vaginal bleeding, a gush of fluids as if your water has broken, regular contractions, or are not feeling baby move as often, please call our office or [facility].   Be well,  Dr. Natale MilchLancaster

## 2015-08-04 NOTE — Assessment & Plan Note (Signed)
Yeast present in both urinalysis and wet prep. Wet prep negative for other organisms.  - Diflucan 150mg  PO x1 - Return if no improvement in 2-3 days - Urine culture also obtained today as test of cure for prior UTI. Will call patient with results if culture has positive growth.

## 2015-08-04 NOTE — Progress Notes (Signed)
   Subjective:    Patient ID: Tina Keller, female    DOB: 01/10/1996, 20 y.o.   MRN: [id number]  HPI  Patient is G1P0 at 2167w4d presenting for same day visit for vaginal discharge.   Patient reports watery white vaginal discharge for the past four dates. Endorses vaginal irritation and minor itching. Denies malodor. Denies dysuria. Endorses increased urinary frequency, but reports this began when she became pregnant and is not new. Denies vaginal bleeding or loss of fluids. Denies contractions. Endorses fetal movement.   Of note, patient was recently treated with 5 days of Keflex for UTI. She completed course two days ago.   Review of Systems See HPI.     Objective:   Physical Exam  Constitutional: She is oriented to person, place, and time. She appears well-developed and well-nourished. No distress.  HENT:  Head: Normocephalic and atraumatic.  Genitourinary: Vaginal discharge (Copious thick white discharge ) found.  Neurological: She is alert and oriented to person, place, and time.  Psychiatric: She has a normal mood and affect. Her behavior is normal.  Nursing note and vitals reviewed.     Assessment & Plan:  Yeast vaginitis Yeast present in both urinalysis and wet prep. Wet prep negative for other organisms.  - Diflucan 150mg  PO x1 - Return if no improvement in 2-3 days - Urine culture also obtained today as test of cure for prior UTI. Will call patient with results if culture has positive growth.    Echogenic focus on anatomy US Discussed with patient today. Explained that finding is most likely muscle calcification not associated with structural or cardiac abnormality. Patient voiced understanding.    Tarri AbernethyAbigail J Sherriann Szuch, MD PGY-1 [facility] Pager [phone]

## 2015-08-04 NOTE — Progress Notes (Signed)
Pt is here for BP check.  Her BP today is 112/70.    She also complains of vaginal discharge, will see Dr. Natale MilchLancaster. Fleeger, Maryjo RochesterJessica Dawn, CMA

## 2015-08-05 LAB — GC/CHLAMYDIA PROBE AMP (~~LOC~~) NOT AT ARMC
Chlamydia: NEGATIVE
NEISSERIA GONORRHEA: NEGATIVE

## 2015-08-05 LAB — PROTEIN / CREATININE RATIO, URINE
CREATININE, URINE: 268 mg/dL (ref 20–320)
Protein Creatinine Ratio: 116 mg/g creat (ref 21–161)
Total Protein, Urine: 31 mg/dL — ABNORMAL HIGH (ref 5–24)

## 2015-08-05 LAB — CULTURE, OB URINE

## 2015-08-05 NOTE — Telephone Encounter (Signed)
-----   Message from Doreene ElandKehinde T Eniola, MD sent at 08/05/2015  2:18 PM EDT ----- Please advise patient that her gonorrhea and chlamydia are negative,.

## 2015-08-05 NOTE — Telephone Encounter (Signed)
Left message for pt to call me back in regards to recent test results. Page, cma.

## 2015-08-05 NOTE — Telephone Encounter (Signed)
Patient is aware of results.

## 2015-08-08 ENCOUNTER — Telehealth: Payer: Self-pay | Admitting: Family Medicine

## 2015-08-08 MED ORDER — CLINDAMYCIN HCL 300 MG PO CAPS: 300.0000 mg | ORAL_CAPSULE | Freq: Three times a day (TID) | ORAL | Status: DC

## 2015-08-08 NOTE — Addendum Note (Signed)
Addended by: Janit PaganENIOLA, Chera Slivka T on: 08/08/2015 09:46 AM   Modules accepted: Orders

## 2015-08-08 NOTE — Telephone Encounter (Signed)
error 

## 2015-08-08 NOTE — Telephone Encounter (Signed)
Patient returned my call.  I discussed GBS bacteruria with her. Recommendation is to treat till cleared from urine. She confirmed that she completed the first course of Keflex for 5 days. This time I prescribed Clindamycin for 7 days. Patient advised to return 1-2 weeks after completion for test of cure. She verbalized understanding. She is otherwise doing well.

## 2015-08-08 NOTE — Telephone Encounter (Signed)
Call back message left with mom.   Call for + urine culture with GBS.

## 2015-08-08 NOTE — Telephone Encounter (Addendum)
In case she calls back please inform her the following.  1. Her urine culture came back positive for GBS which she had in previous urine test but the quantity of colonization increased. This could be due to failed A/B treatment. ?? Poor compliance since she recently completed Keflex.  I have sent a new antibiotic to her pharmacy for 7 days. Return in 2 wks for repeat culture.  NB: If she did not take the previously prescribed antibiotic, she can use that instead and let us know so we can cancel the new A/B at the pharmacy.

## 2015-08-10 ENCOUNTER — Telehealth: Payer: Self-pay | Admitting: *Deleted

## 2015-08-10 ENCOUNTER — Encounter: Payer: Self-pay | Admitting: *Deleted

## 2015-08-10 NOTE — Telephone Encounter (Signed)
-----   Message from Kehinde T Eniola, MD sent at 08/05/2015  2:18 PM EDT ----- Please advise patient that her gonorrhea and chlamydia are negative,. 

## 2015-08-10 NOTE — Telephone Encounter (Signed)
LM for patient to call back. Berdena Cisek,CMA  

## 2015-08-10 NOTE — Telephone Encounter (Signed)
Results released to mychart. Jazmin Hartsell,CMA

## 2015-08-11 ENCOUNTER — Ambulatory Visit: Payer: BLUE CROSS/BLUE SHIELD | Admitting: Family Medicine

## 2015-08-11 ENCOUNTER — Encounter: Payer: Self-pay | Admitting: Family Medicine

## 2015-08-18 ENCOUNTER — Encounter: Payer: BLUE CROSS/BLUE SHIELD | Admitting: Family Medicine

## 2015-08-25 ENCOUNTER — Encounter: Payer: BLUE CROSS/BLUE SHIELD | Admitting: Family Medicine

## 2015-09-02 ENCOUNTER — Ambulatory Visit (INDEPENDENT_AMBULATORY_CARE_PROVIDER_SITE_OTHER): Payer: BLUE CROSS/BLUE SHIELD | Admitting: Family Medicine

## 2015-09-02 VITALS — BP 120/70 | HR 74 | Temp 99.3°F | Wt 266.0 lb

## 2015-09-02 DIAGNOSIS — N390 Urinary tract infection, site not specified: Secondary | ICD-10-CM

## 2015-09-02 DIAGNOSIS — Z3490 Encounter for supervision of normal pregnancy, unspecified, unspecified trimester: Secondary | ICD-10-CM | POA: Insufficient documentation

## 2015-09-02 DIAGNOSIS — Z3402 Encounter for supervision of normal first pregnancy, second trimester: Secondary | ICD-10-CM

## 2015-09-02 HISTORY — DX: Encounter for supervision of normal first pregnancy, second trimester: Z34.02

## 2015-09-02 NOTE — Patient Instructions (Signed)

## 2015-09-02 NOTE — Progress Notes (Signed)
Tina Keller is a 20 y.o. G1P0 at [redacted]w[redacted]d here for routine follow up.  She reports pulling pain in lower abd x4 yesterday lasting ~10sec. Discussed round ligament pain  See flow sheet for details.  A/P: Pregnancy at [redacted]w[redacted]d.  Doing well.   Pregnancy issues include GBS UTI Anatomy scan reviewed, problems are not noted.  Small echogenic focus in LV. Preterm labor precautions reviewed. Follow up 4 weeks.

## 2015-09-04 LAB — CULTURE, OB URINE: Organism ID, Bacteria: 10000

## 2015-09-06 ENCOUNTER — Encounter: Payer: Self-pay | Admitting: Family Medicine

## 2015-09-06 ENCOUNTER — Inpatient Hospital Stay (HOSPITAL_COMMUNITY)
Admission: AD | Admit: 2015-09-06 | Discharge: 2015-09-06 | Disposition: A | Discharge status: Home / Self Care | Payer: BLUE CROSS/BLUE SHIELD | Source: Ambulatory Visit | Attending: Obstetrics & Gynecology | Admitting: Obstetrics & Gynecology

## 2015-09-06 ENCOUNTER — Encounter (HOSPITAL_COMMUNITY): Payer: Self-pay | Admitting: *Deleted

## 2015-09-06 DIAGNOSIS — B3731 Acute candidiasis of vulva and vagina: Secondary | ICD-10-CM

## 2015-09-06 DIAGNOSIS — B373 Candidiasis of vulva and vagina: Secondary | ICD-10-CM

## 2015-09-06 DIAGNOSIS — Z3A24 24 weeks gestation of pregnancy: Secondary | ICD-10-CM | POA: Diagnosis not present

## 2015-09-06 DIAGNOSIS — Z87891 Personal history of nicotine dependence: Secondary | ICD-10-CM | POA: Insufficient documentation

## 2015-09-06 DIAGNOSIS — R109 Unspecified abdominal pain: Secondary | ICD-10-CM | POA: Insufficient documentation

## 2015-09-06 DIAGNOSIS — O26892 Other specified pregnancy related conditions, second trimester: Secondary | ICD-10-CM | POA: Diagnosis not present

## 2015-09-06 LAB — URINE MICROSCOPIC-ADD ON: RBC / HPF: NONE SEEN RBC/hpf (ref 0–5)

## 2015-09-06 LAB — URINALYSIS, ROUTINE W REFLEX MICROSCOPIC
Bilirubin Urine: NEGATIVE
Glucose, UA: NEGATIVE mg/dL
Ketones, ur: NEGATIVE mg/dL
NITRITE: NEGATIVE
PH: 6 (ref 5.0–8.0)
Protein, ur: 30 mg/dL — AB
SPECIFIC GRAVITY, URINE: 1.025 (ref 1.005–1.030)

## 2015-09-06 LAB — WET PREP, GENITAL
Clue Cells Wet Prep HPF POC: NONE SEEN
Sperm: NONE SEEN
Trich, Wet Prep: NONE SEEN

## 2015-09-06 LAB — POCT FERN TEST: POCT FERN TEST: NEGATIVE

## 2015-09-06 MED ORDER — TERCONAZOLE 0.4 % VA CREA: 1.0000 | TOPICAL_CREAM | Freq: Every day | VAGINAL | 0 refills | Status: DC

## 2015-09-06 NOTE — MAU Provider Note (Signed)
MAU HISTORY AND PHYSICAL  Chief Complaint:  Rupture of Membranes and Abdominal Pain   Tina Keller is a 20 y.o.  G1P0  at [redacted]w[redacted]d presenting for Rupture of Membranes and Abdominal Pain . Patient states she has been having  none contractions, minimal vaginal bleeding, intact, sterile speculum exam negative for pooling membranes, with active fetal movement.    Past Medical History:  Diagnosis Date  . Medical history non-contributory     Past Surgical History:  Procedure Laterality Date  . NO PAST SURGERIES      History reviewed. No pertinent family history.  Social History  Substance Use Topics  . Smoking status: Former Smoker    Types: Cigarettes  . Smokeless tobacco: Never Used  . Alcohol use Yes     Comment: social not since pregnacy    No Known Allergies  Prescriptions Prior to Admission  Medication Sig Dispense Refill Last Dose  . Prenatal Vit-Fe Fumarate-FA (PRENATAL MULTIVITAMIN) TABS tablet Take 1 tablet by mouth daily at 12 noon.   09/05/2015 at Unknown time  . cephALEXin (KEFLEX) 500 MG capsule Take 1 capsule (500 mg total) by mouth 2 (two) times daily. For 5 days (Patient not taking: Reported on 09/06/2015) 10 capsule 0 Completed Course at Unknown time  . clindamycin (CLEOCIN) 300 MG capsule Take 1 capsule (300 mg total) by mouth 3 (three) times daily. (Patient not taking: Reported on 09/06/2015) 21 capsule 0 Completed Course at Unknown time  . fluconazole (DIFLUCAN) 150 MG tablet Take 1 tablet (150 mg total) by mouth once. (Patient not taking: Reported on 09/06/2015) 1 tablet 0 Completed Course at Unknown time  . vitamin B-6 (PYRIDOXINE) 25 MG tablet Take 1 tablet (25 mg total) by mouth every 8 (eight) hours as needed (Nausea). (Patient not taking: Reported on 07/14/2015) 30 tablet 0 Not Taking at Unknown time    Review of Systems - Negative except for what is mentioned in HPI.  Physical Exam  Blood pressure 127/74, pulse 105, temperature 98.5 F (36.9 C), temperature  source Oral, resp. rate 18, last menstrual period 03/20/2015. GENERAL: Well-developed, well-nourished female in no acute distress.  LUNGS: Clear to auscultation bilaterally.  HEART: Regular rate and rhythm. ABDOMEN: Soft, nontender, nondistended, gravid.  EXTREMITIES: Nontender, no edema, 2+ distal pulses. GU: sterile speculum exam negative for pooling, copious white discharge present Presentation: undetermined FHT:  Cat 1 Contractions: none   Labs: Results for orders placed or performed during the hospital encounter of 09/06/15 (from the past 24 hour(s))  Urinalysis, Routine w reflex microscopic (not at [facility])   Collection Time: 09/06/15 12:03 PM  Result Value Ref Range   Color, Urine YELLOW YELLOW   APPearance CLOUDY (A) CLEAR   Specific Gravity, Urine 1.025 1.005 - 1.030   pH 6.0 5.0 - 8.0   Glucose, UA NEGATIVE NEGATIVE mg/dL   Hgb urine dipstick SMALL (A) NEGATIVE   Bilirubin Urine NEGATIVE NEGATIVE   Ketones, ur NEGATIVE NEGATIVE mg/dL   Protein, ur 30 (A) NEGATIVE mg/dL   Nitrite NEGATIVE NEGATIVE   Leukocytes, UA MODERATE (A) NEGATIVE  Urine microscopic-add on   Collection Time: 09/06/15 12:03 PM  Result Value Ref Range   Squamous Epithelial / LPF TOO NUMEROUS TO COUNT (A) NONE SEEN   WBC, UA TOO NUMEROUS TO COUNT 0 - 5 WBC/hpf   RBC / HPF NONE SEEN 0 - 5 RBC/hpf   Bacteria, UA MANY (A) NONE SEEN   Urine-Other MUCOUS PRESENT     Imaging Studies:  No results  found.  Assessment: Tina Keller is  20 y.o. G1P0 at [redacted]w[redacted]d presents with Rupture of Membranes and Abdominal Pain   Plan: Rule out rupture of membraes:  -fern negative -wet prep - yeast and bacteria present > rx teraconazole x7days -UA: many bacteria, protein, leuks > will send urine for culture  Loni Muse 8/1/201712:43 PM   OB FELLOW MAU DISCHARGE ATTESTATION  I have seen and examined this patient; I agree with above documentation in the resident's note.    Jen Mow, DO OB  Fellow 09/06/2015 1:00PM

## 2015-09-06 NOTE — Discharge Instructions (Signed)

## 2015-09-06 NOTE — MAU Note (Signed)
Pt states she had gush of water last night, was clear/white, underwear was wet during the night but dry today. Started spotting this morning, has some vaginal irritation.  Lower abd cramping started last night.

## 2015-09-07 LAB — GC/CHLAMYDIA PROBE AMP (~~LOC~~) NOT AT ARMC
CHLAMYDIA, DNA PROBE: NEGATIVE
NEISSERIA GONORRHEA: NEGATIVE

## 2015-09-07 LAB — CULTURE, OB URINE

## 2015-09-08 ENCOUNTER — Telehealth: Payer: Self-pay | Admitting: Obstetrics and Gynecology

## 2015-09-08 MED ORDER — CEPHALEXIN 500 MG PO CAPS: 500.0000 mg | ORAL_CAPSULE | Freq: Four times a day (QID) | ORAL | 0 refills | Status: DC

## 2015-09-08 NOTE — Telephone Encounter (Signed)
+   GBS in urine. Keflex called to her pharmacy.

## 2015-09-30 ENCOUNTER — Ambulatory Visit (INDEPENDENT_AMBULATORY_CARE_PROVIDER_SITE_OTHER): Payer: BLUE CROSS/BLUE SHIELD | Admitting: Family Medicine

## 2015-09-30 ENCOUNTER — Encounter: Payer: Self-pay | Admitting: Family Medicine

## 2015-09-30 VITALS — BP 114/65 | HR 81 | Temp 98.1°F | Wt 266.0 lb

## 2015-09-30 DIAGNOSIS — Z3402 Encounter for supervision of normal first pregnancy, second trimester: Secondary | ICD-10-CM

## 2015-09-30 DIAGNOSIS — Z87440 Personal history of urinary (tract) infections: Secondary | ICD-10-CM

## 2015-09-30 DIAGNOSIS — O09893 Supervision of other high risk pregnancies, third trimester: Secondary | ICD-10-CM

## 2015-09-30 DIAGNOSIS — Z3493 Encounter for supervision of normal pregnancy, unspecified, third trimester: Secondary | ICD-10-CM

## 2015-09-30 LAB — CBC WITH DIFFERENTIAL/PLATELET
BASOS ABS: 0 {cells}/uL (ref 0–200)
Basophils Relative: 0 %
EOS PCT: 0 %
Eosinophils Absolute: 0 cells/uL — ABNORMAL LOW (ref 15–500)
HCT: 34.3 % — ABNORMAL LOW (ref 35.0–45.0)
Hemoglobin: 11.1 g/dL — ABNORMAL LOW (ref 11.7–15.5)
LYMPHS ABS: 2632 {cells}/uL (ref 850–3900)
Lymphocytes Relative: 28 %
MCH: 27.3 pg (ref 27.0–33.0)
MCHC: 32.4 g/dL (ref 32.0–36.0)
MCV: 84.3 fL (ref 80.0–100.0)
MONOS PCT: 9 %
MPV: 10.7 fL (ref 7.5–12.5)
Monocytes Absolute: 846 cells/uL (ref 200–950)
NEUTROS ABS: 5922 {cells}/uL (ref 1500–7800)
NEUTROS PCT: 63 %
PLATELETS: 218 10*3/uL (ref 140–400)
RBC: 4.07 MIL/uL (ref 3.80–5.10)
RDW: 13.8 % (ref 11.0–15.0)
WBC: 9.4 10*3/uL (ref 3.8–10.8)

## 2015-09-30 LAB — GLUCOSE, CAPILLARY
Comment 1: 1
Glucose-Capillary: 102 mg/dL — ABNORMAL HIGH (ref 65–99)

## 2015-09-30 NOTE — Patient Instructions (Signed)
Prenatal Care °WHAT IS PRENATAL CARE?  °Prenatal care is the process of caring for a pregnant woman before she gives birth. Prenatal care makes sure that she and her baby remain as healthy as possible throughout pregnancy. Prenatal care may be provided by a midwife, family practice health care provider, or a childbirth and pregnancy specialist (obstetrician). Prenatal care may include physical examinations, testing, treatments, and education on nutrition, lifestyle, and social support services. °WHY IS PRENATAL CARE SO IMPORTANT?  °Early and consistent prenatal care increases the chance that you and your baby will remain healthy throughout your pregnancy. This type of care also decreases a baby's risk of being born too early (prematurely), or being born smaller than expected (small for gestational age). Any underlying medical conditions you may have that could pose a risk during your pregnancy are discussed during prenatal care visits. You will also be monitored regularly for any new conditions that may arise during your pregnancy so they can be treated quickly and effectively. °WHAT HAPPENS DURING PRENATAL CARE VISITS? °Prenatal care visits may include the following: °Discussion °Tell your health care provider about any new signs or symptoms you have experienced since your last visit. These might include: °· Nausea or vomiting. °· Increased or decreased level of energy. °· Difficulty sleeping. °· Back or leg pain. °· Weight changes. °· Frequent urination. °· Shortness of breath with physical activity. °· Changes in your skin, such as the development of a rash or itchiness. °· Vaginal discharge or bleeding. °· Feelings of excitement or nervousness. °· Changes in your baby's movements. °You may want to write down any questions or topics you want to discuss with your health care provider and bring them with you to your appointment. °Examination °During your first prenatal care visit, you will likely have a complete  physical exam. Your health care provider will often examine your vagina, cervix, and the position of your uterus, as well as check your heart, lungs, and other body systems. As your pregnancy progresses, your health care provider will measure the size of your uterus and your baby's position inside your uterus. He or she may also examine you for early signs of labor. Your prenatal visits may also include checking your blood pressure and, after about 10-12 weeks of pregnancy, listening to your baby's heartbeat. °Testing °Regular testing often includes: °· Urinalysis. This checks your urine for glucose, protein, or signs of infection. °· Blood count. This checks the levels of white and red blood cells in your body. °· Tests for sexually transmitted infections (STIs). Testing for STIs at the beginning of pregnancy is routinely done and is required in many states. °· Antibody testing. You will be checked to see if you are immune to certain illnesses, such as rubella, that can affect a developing fetus. °· Glucose screen. Around 24-28 weeks of pregnancy, your blood glucose level will be checked for signs of gestational diabetes. Follow-up tests may be recommended. °· Group B strep. This is a bacteria that is commonly found inside a woman's vagina. This test will inform your health care provider if you need an antibiotic to reduce the amount of this bacteria in your body prior to labor and childbirth. °· Ultrasound. Many pregnant women undergo an ultrasound screening around 18-20 weeks of pregnancy to evaluate the health of the fetus and check for any developmental abnormalities. °· HIV (human immunodeficiency virus) testing. Early in your pregnancy, you will be screened for HIV. If you are at high risk for HIV, this test   may be repeated during your third trimester of pregnancy. °You may be offered other testing based on your age, personal or family medical history, or other factors.  °HOW OFTEN SHOULD I PLAN TO SEE MY  HEALTH CARE PROVIDER FOR PRENATAL CARE? °Your prenatal care check-up schedule depends on any medical conditions you have before, or develop during, your pregnancy. If you do not have any underlying medical conditions, you will likely be seen for checkups: °· Monthly, during the first 6 months of pregnancy. °· Twice a month during months 7 and 8 of pregnancy. °· Weekly starting in the 9th month of pregnancy and until delivery. °If you develop signs of early labor or other concerning signs or symptoms, you may need to see your health care provider more often. Ask your health care provider what prenatal care schedule is best for you. °WHAT CAN I DO TO KEEP MYSELF AND MY BABY AS HEALTHY AS POSSIBLE DURING MY PREGNANCY? °· Take a prenatal vitamin containing 400 micrograms (0.4 mg) of folic acid every day. Your health care provider may also ask you to take additional vitamins such as iodine, vitamin D, iron, copper, and zinc. °· Take 1500-2000 mg of calcium daily starting at your 20th week of pregnancy until you deliver your baby. °· Make sure you are up to date on your vaccinations. Unless directed otherwise by your health care provider: °¨ You should receive a tetanus, diphtheria, and pertussis (Tdap) vaccination between the 27th and 36th week of your pregnancy, regardless of when your last Tdap immunization occurred. This helps protect your baby from whooping cough (pertussis) after he or she is born. °¨ You should receive an annual inactivated influenza vaccine (IIV) to help protect you and your baby from influenza. This can be done at any point during your pregnancy. °· Eat a well-rounded diet that includes: °¨ Fresh fruits and vegetables. °¨ Lean proteins. °¨ Calcium-rich foods such as milk, yogurt, hard cheeses, and dark, leafy greens. °¨ Whole grain breads. °· Do not eat seafood high in mercury, including: °¨ Swordfish. °¨ Tilefish. °¨ Shark. °¨ King mackerel. °¨ More than 6 oz tuna per week. °· Do not eat: °¨ Raw  or undercooked meats or eggs. °¨ Unpasteurized foods, such as soft cheeses (brie, blue, or feta), juices, and milks. °¨ Lunch meats. °¨ Hot dogs that have not been heated until they are steaming. °· Drink enough water to keep your urine clear or pale yellow. For many women, this may be 10 or more 8 oz glasses of water each day. Keeping yourself hydrated helps deliver nutrients to your baby and may prevent the start of pre-term uterine contractions. °· Do not use any tobacco products including cigarettes, chewing tobacco, or electronic cigarettes. If you need help quitting, ask your health care provider. °· Do not drink beverages containing alcohol. No safe level of alcohol consumption during pregnancy has been determined. °· Do not use any illegal drugs. These can harm your developing baby or cause a miscarriage. °· Ask your health care provider or pharmacist before taking any prescription or over-the-counter medicines, herbs, or supplements. °· Limit your caffeine intake to no more than 200 mg per day. °· Exercise. Unless told otherwise by your health care provider, try to get 30 minutes of moderate exercise most days of the week. Do not  do high-impact activities, contact sports, or activities with a high risk of falling, such as horseback riding or downhill skiing. °· Get plenty of rest. °· Avoid anything that raises your   body temperature, such as hot tubs and saunas. °· If you own a cat, do not empty its litter box. Bacteria contained in cat feces can cause an infection called toxoplasmosis. This can result in serious harm to the fetus. °· Stay away from chemicals such as insecticides, lead, mercury, and cleaning or paint products that contain solvents. °· Do not have any X-rays taken unless medically necessary. °· Take a childbirth and breastfeeding preparation class. Ask your health care provider if you need a referral or recommendation. °  °This information is not intended to replace advice given to you by  your health care provider. Make sure you discuss any questions you have with your health care provider. °  °Document Released: 01/25/2003 Document Revised: 02/12/2014 Document Reviewed: 04/08/2013 °[employer]vier Interactive Patient Education ©2016 [employer] ° °

## 2015-09-30 NOTE — Progress Notes (Signed)
[name] is a 20 y.o. G1P0 at 9256w5d for routine follow up.  She reports: Concern about OB U/S report, otherwise doing well.  See flow sheet for details.  A/P: Pregnancy at 1256w5d.  Doing well.   Pregnancy issues include: GBS UTI, multiple A/B treatment without clearance  Infant feeding choice: Breast or bottle feed  Tdapwas not given today. Patient declined Tdap and flu shot today. She stated she will discuss at her next follow up. 1 hour glucola, CBC, RPR, and HIV were done today.   Pregnancy medical home forms were done today and reviewed.   RH status was reviewed and pt does not need Rhogam.  Rhogam was not given today.   Childbirth and education classes were not offered. Preterm labor precautions reviewed. Kick counts reviewed. Repeat urine culture. Recommends GBS clearance or colony less than 10,000. Patient otherwise denies GU symptoms. OB U/S reviewed and discussed with patient. Left ventricle echogenicity and aortic arch was not properly viewed. I explained to her this is likely benign, but reasonable to recheck given that the aortic arch was not completely visualized. Repeat U/S scheduled. Follow up 2 weeks.

## 2015-10-01 LAB — HIV ANTIBODY (ROUTINE TESTING W REFLEX): HIV: NONREACTIVE

## 2015-10-01 LAB — RPR

## 2015-10-01 NOTE — Progress Notes (Signed)
error 

## 2015-10-02 LAB — CULTURE, OB URINE

## 2015-10-03 ENCOUNTER — Telehealth: Payer: Self-pay | Admitting: Family Medicine

## 2015-10-03 NOTE — Telephone Encounter (Signed)
Patient's urine GBS remains positive > 10,000 CFU up to 50,000 despite being treated three times. Plan is to give prophylactic antibiotic till delivery. I called ID and spoke with Dr. Luciana Axeomer. He recommended IM Rocephin for 3 doses. Recheck culture after treatment and if still persistent then to treat prophylactically with Amoxicillin till delivery. I discussed this with patient. She will call to schedule nurse visit to start treatment. Return in 1-2 wks after treatment for repeat culture.  Start Rocephin 1g IM daily for 3 doses.

## 2015-10-03 NOTE — Telephone Encounter (Signed)
Left voice message for patient to call for a nurse visit for the next 3 days for an injection per Dr. Lum BabeEniola.  Clovis PuMartin, Tamika L, RN

## 2015-10-04 ENCOUNTER — Ambulatory Visit: Payer: BLUE CROSS/BLUE SHIELD

## 2015-10-04 ENCOUNTER — Ambulatory Visit (HOSPITAL_COMMUNITY)
Admission: RE | Admit: 2015-10-04 | Discharge: 2015-10-04 | Disposition: A | Discharge status: Home / Self Care | Payer: BLUE CROSS/BLUE SHIELD | Source: Ambulatory Visit | Attending: Family Medicine | Admitting: Family Medicine

## 2015-10-04 DIAGNOSIS — Z36 Encounter for antenatal screening of mother: Secondary | ICD-10-CM | POA: Insufficient documentation

## 2015-10-04 DIAGNOSIS — Z3493 Encounter for supervision of normal pregnancy, unspecified, third trimester: Secondary | ICD-10-CM

## 2015-10-04 DIAGNOSIS — Z3A27 27 weeks gestation of pregnancy: Secondary | ICD-10-CM | POA: Insufficient documentation

## 2015-10-04 DIAGNOSIS — O358XX0 Maternal care for other (suspected) fetal abnormality and damage, not applicable or unspecified: Secondary | ICD-10-CM | POA: Diagnosis present

## 2015-10-04 DIAGNOSIS — O99212 Obesity complicating pregnancy, second trimester: Secondary | ICD-10-CM | POA: Insufficient documentation

## 2015-10-05 ENCOUNTER — Telehealth: Payer: Self-pay | Admitting: Family Medicine

## 2015-10-05 ENCOUNTER — Ambulatory Visit: Payer: BLUE CROSS/BLUE SHIELD

## 2015-10-05 NOTE — Telephone Encounter (Signed)
Message left to call back..    Note; OB Ultrasound result is normal. May relay message to her when she returns my call.

## 2015-10-19 ENCOUNTER — Ambulatory Visit (INDEPENDENT_AMBULATORY_CARE_PROVIDER_SITE_OTHER): Payer: BLUE CROSS/BLUE SHIELD | Admitting: *Deleted

## 2015-10-19 DIAGNOSIS — Z22330 Carrier of Group B streptococcus: Secondary | ICD-10-CM

## 2015-10-19 DIAGNOSIS — O99820 Streptococcus B carrier state complicating pregnancy: Secondary | ICD-10-CM

## 2015-10-19 MED ORDER — CEFTRIAXONE SODIUM 1 G IJ SOLR
1.0000 g | Freq: Once | INTRAMUSCULAR | Status: AC
  Administered 2015-10-19: 1 g via INTRAMUSCULAR

## 2015-10-19 NOTE — Progress Notes (Signed)
   Patient in nurse clinic for ceftriaxone injection. Per Dr. Lum BabeEniola order; Ceftriaxone 1 gm IM x 3 days.  First dose given today RUQO.  Patient has an appointment tomorrow with provider to received 2nd dose.  Clovis PuMartin, Jossalin Chervenak L, RN

## 2015-10-20 ENCOUNTER — Ambulatory Visit (INDEPENDENT_AMBULATORY_CARE_PROVIDER_SITE_OTHER): Payer: BLUE CROSS/BLUE SHIELD | Admitting: Internal Medicine

## 2015-10-20 VITALS — BP 123/69 | HR 93 | Temp 98.2°F | Wt 266.0 lb

## 2015-10-20 DIAGNOSIS — Z3402 Encounter for supervision of normal first pregnancy, second trimester: Secondary | ICD-10-CM

## 2015-10-20 DIAGNOSIS — Z23 Encounter for immunization: Secondary | ICD-10-CM | POA: Diagnosis not present

## 2015-10-20 MED ORDER — CEFTRIAXONE SODIUM 1 G IJ SOLR: 1.0000 g | Freq: Once | INTRAMUSCULAR | 0 refills | Status: AC

## 2015-10-20 MED ORDER — CEFTRIAXONE SODIUM 1 G IJ SOLR
1.0000 g | Freq: Once | INTRAMUSCULAR | Status: AC
  Administered 2015-10-20: 1 g via INTRAMUSCULAR

## 2015-10-20 NOTE — Patient Instructions (Addendum)
You will need to come in for another nurse visit tomorrow to get ceftriaxone injection. You'll need to make an appointment in 1-2 weeks after that injection to get a follow-up urine culture. You will need to make an prenatal care appointment in 4 weeks.   Third Trimester of Pregnancy The third trimester is from week 29 through week 42, months 7 through 9. The third trimester is a time when the fetus is growing rapidly. At the end of the ninth month, the fetus is about 20 inches in length and weighs 6-10 pounds.  BODY CHANGES Your body goes through many changes during pregnancy. The changes vary from woman to woman.   Your weight will continue to increase. You can expect to gain 25-35 pounds (11-16 kg) by the end of the pregnancy.  You may begin to get stretch marks on your hips, abdomen, and breasts.  You may urinate more often because the fetus is moving lower into your pelvis and pressing on your bladder.  You may develop or continue to have heartburn as a result of your pregnancy.  You may develop constipation because certain hormones are causing the muscles that push waste through your intestines to slow down.  You may develop hemorrhoids or swollen, bulging veins (varicose veins).  You may have pelvic pain because of the weight gain and pregnancy hormones relaxing your joints between the bones in your pelvis. Backaches may result from overexertion of the muscles supporting your posture.  You may have changes in your hair. These can include thickening of your hair, rapid growth, and changes in texture. Some women also have hair loss during or after pregnancy, or hair that feels dry or thin. Your hair will most likely return to normal after your baby is born.  Your breasts will continue to grow and be tender. A yellow discharge may leak from your breasts called colostrum.  Your belly button may stick out.  You may feel short of breath because of your expanding uterus.  You may notice  the fetus "dropping," or moving lower in your abdomen.  You may have a bloody mucus discharge. This usually occurs a few days to a week before labor begins.  Your cervix becomes thin and soft (effaced) near your due date. WHAT TO EXPECT AT YOUR PRENATAL EXAMS  You will have prenatal exams every 2 weeks until week 36. Then, you will have weekly prenatal exams. During a routine prenatal visit:  You will be weighed to make sure you and the fetus are growing normally.  Your blood pressure is taken.  Your abdomen will be measured to track your baby's growth.  The fetal heartbeat will be listened to.  Any test results from the previous visit will be discussed.  You may have a cervical check near your due date to see if you have effaced. At around 36 weeks, your caregiver will check your cervix. At the same time, your caregiver will also perform a test on the secretions of the vaginal tissue. This test is to determine if a type of bacteria, Group B streptococcus, is present. Your caregiver will explain this further. Your caregiver may ask you:  What your birth plan is.  How you are feeling.  If you are feeling the baby move.  If you have had any abnormal symptoms, such as leaking fluid, bleeding, severe headaches, or abdominal cramping.  If you are using any tobacco products, including cigarettes, chewing tobacco, and electronic cigarettes.  If you have any questions. Other  tests or screenings that may be performed during your third trimester include:  Blood tests that check for low iron levels (anemia).  Fetal testing to check the health, activity level, and growth of the fetus. Testing is done if you have certain medical conditions or if there are problems during the pregnancy.  HIV (human immunodeficiency virus) testing. If you are at high risk, you may be screened for HIV during your third trimester of pregnancy. FALSE LABOR You may feel small, irregular contractions that  eventually go away. These are called Braxton Hicks contractions, or false labor. Contractions may last for hours, days, or even weeks before true labor sets in. If contractions come at regular intervals, intensify, or become painful, it is best to be seen by your caregiver.  SIGNS OF LABOR   Menstrual-like cramps.  Contractions that are 5 minutes apart or less.  Contractions that start on the top of the uterus and spread down to the lower abdomen and back.  A sense of increased pelvic pressure or back pain.  A watery or bloody mucus discharge that comes from the vagina. If you have any of these signs before the 37th week of pregnancy, call your caregiver right away. You need to go to the hospital to get checked immediately. HOME CARE INSTRUCTIONS   Avoid all smoking, herbs, alcohol, and unprescribed drugs. These chemicals affect the formation and growth of the baby.  Do not use any tobacco products, including cigarettes, chewing tobacco, and electronic cigarettes. If you need help quitting, ask your health care provider. You may receive counseling support and other resources to help you quit.  Follow your caregiver's instructions regarding medicine use. There are medicines that are either safe or unsafe to take during pregnancy.  Exercise only as directed by your caregiver. Experiencing uterine cramps is a good sign to stop exercising.  Continue to eat regular, healthy meals.  Wear a good support bra for breast tenderness.  Do not use hot tubs, steam rooms, or saunas.  Wear your seat belt at all times when driving.  Avoid raw meat, uncooked cheese, cat litter boxes, and soil used by cats. These carry germs that can cause birth defects in the baby.  Take your prenatal vitamins.  Take 1500-2000 mg of calcium daily starting at the 20th week of pregnancy until you deliver your baby.  Try taking a stool softener (if your caregiver approves) if you develop constipation. Eat more  high-fiber foods, such as fresh vegetables or fruit and whole grains. Drink plenty of fluids to keep your urine clear or pale yellow.  Take warm sitz baths to soothe any pain or discomfort caused by hemorrhoids. Use hemorrhoid cream if your caregiver approves.  If you develop varicose veins, wear support hose. Elevate your feet for 15 minutes, 3-4 times a day. Limit salt in your diet.  Avoid heavy lifting, wear low heal shoes, and practice good posture.  Rest a lot with your legs elevated if you have leg cramps or low back pain.  Visit your dentist if you have not gone during your pregnancy. Use a soft toothbrush to brush your teeth and be gentle when you floss.  A sexual relationship may be continued unless your caregiver directs you otherwise.  Do not travel far distances unless it is absolutely necessary and only with the approval of your caregiver.  Take prenatal classes to understand, practice, and ask questions about the labor and delivery.  Make a trial run to the hospital.  Pack your  hospital bag.  Prepare the baby's nursery.  Continue to go to all your prenatal visits as directed by your caregiver. SEEK MEDICAL CARE IF:  You are unsure if you are in labor or if your water has broken.  You have dizziness.  You have mild pelvic cramps, pelvic pressure, or nagging pain in your abdominal area.  You have persistent nausea, vomiting, or diarrhea.  You have a bad smelling vaginal discharge.  You have pain with urination. SEEK IMMEDIATE MEDICAL CARE IF:   You have a fever.  You are leaking fluid from your vagina.  You have spotting or bleeding from your vagina.  You have severe abdominal cramping or pain.  You have rapid weight loss or gain.  You have shortness of breath with chest pain.  You notice sudden or extreme swelling of your face, hands, ankles, feet, or legs.  You have not felt your baby move in over an hour.  You have severe headaches that do not go  away with medicine.  You have vision changes.   This information is not intended to replace advice given to you by your health care provider. Make sure you discuss any questions you have with your health care provider.   Document Released: 01/16/2001 Document Revised: 02/12/2014 Document Reviewed: 03/25/2012 Elsevier Interactive Patient Education 2016 [employer]   Fetal Movement Counts Patient Name: __________________________________________________ Patient Due Date: ____________________ Performing a fetal movement count is highly recommended in high-risk pregnancies, but it is good for every pregnant woman to do. Your health care provider may ask you to start counting fetal movements at 28 weeks of the pregnancy. Fetal movements often increase:  After eating a full meal.  After physical activity.  After eating or drinking something sweet or cold.  At rest. Pay attention to when you feel the baby is most active. This will help you notice a pattern of your baby's sleep and wake cycles and what factors contribute to an increase in fetal movement. It is important to perform a fetal movement count at the same time each day when your baby is normally most active.  HOW TO COUNT FETAL MOVEMENTS 1. Find a quiet and comfortable area to sit or lie down on your left side. Lying on your left side provides the best blood and oxygen circulation to your baby. 2. Write down the day and time on a sheet of paper or in a journal. 3. Start counting kicks, flutters, swishes, rolls, or jabs in a 2-hour period. You should feel at least 10 movements within 2 hours. 4. If you do not feel 10 movements in 2 hours, wait 2-3 hours and count again. Look for a change in the pattern or not enough counts in 2 hours. SEEK MEDICAL CARE IF:  You feel less than 10 counts in 2 hours, tried twice.  There is no movement in over an hour.  The pattern is changing or taking longer each day to reach 10 counts in 2  hours.  You feel the baby is not moving as he or she usually does. Date: ____________ Movements: ____________ Start time: ____________ Doreatha Martin time: ____________  Date: ____________ Movements: ____________ Start time: ____________ Doreatha Martin time: ____________ Date: ____________ Movements: ____________ Start time: ____________ Doreatha Martin time: ____________ Date: ____________ Movements: ____________ Start time: ____________ Doreatha Martin time: ____________ Date: ____________ Movements: ____________ Start time: ____________ Doreatha Martin time: ____________ Date: ____________ Movements: ____________ Start time: ____________ Doreatha Martin time: ____________ Date: ____________ Movements: ____________ Start time: ____________ Doreatha Martin time: ____________ Date: ____________ Movements:  ____________ Start time: ____________ Doreatha Martin time: ____________  Date: ____________ Movements: ____________ Start time: ____________ Doreatha Martin time: ____________ Date: ____________ Movements: ____________ Start time: ____________ Doreatha Martin time: ____________ Date: ____________ Movements: ____________ Start time: ____________ Doreatha Martin time: ____________ Date: ____________ Movements: ____________ Start time: ____________ Doreatha Martin time: ____________ Date: ____________ Movements: ____________ Start time: ____________ Doreatha Martin time: ____________ Date: ____________ Movements: ____________ Start time: ____________ Doreatha Martin time: ____________ Date: ____________ Movements: ____________ Start time: ____________ Doreatha Martin time: ____________  Date: ____________ Movements: ____________ Start time: ____________ Doreatha Martin time: ____________ Date: ____________ Movements: ____________ Start time: ____________ Doreatha Martin time: ____________ Date: ____________ Movements: ____________ Start time: ____________ Doreatha Martin time: ____________ Date: ____________ Movements: ____________ Start time: ____________ Doreatha Martin time: ____________ Date: ____________ Movements: ____________ Start time: ____________  Doreatha Martin time: ____________ Date: ____________ Movements: ____________ Start time: ____________ Doreatha Martin time: ____________ Date: ____________ Movements: ____________ Start time: ____________ Doreatha Martin time: ____________  Date: ____________ Movements: ____________ Start time: ____________ Doreatha Martin time: ____________ Date: ____________ Movements: ____________ Start time: ____________ Doreatha Martin time: ____________ Date: ____________ Movements: ____________ Start time: ____________ Doreatha Martin time: ____________ Date: ____________ Movements: ____________ Start time: ____________ Doreatha Martin time: ____________ Date: ____________ Movements: ____________ Start time: ____________ Doreatha Martin time: ____________ Date: ____________ Movements: ____________ Start time: ____________ Doreatha Martin time: ____________ Date: ____________ Movements: ____________ Start time: ____________ Doreatha Martin time: ____________  Date: ____________ Movements: ____________ Start time: ____________ Doreatha Martin time: ____________ Date: ____________ Movements: ____________ Start time: ____________ Doreatha Martin time: ____________ Date: ____________ Movements: ____________ Start time: ____________ Doreatha Martin time: ____________ Date: ____________ Movements: ____________ Start time: ____________ Doreatha Martin time: ____________ Date: ____________ Movements: ____________ Start time: ____________ Doreatha Martin time: ____________ Date: ____________ Movements: ____________ Start time: ____________ Doreatha Martin time: ____________ Date: ____________ Movements: ____________ Start time: ____________ Doreatha Martin time: ____________  Date: ____________ Movements: ____________ Start time: ____________ Doreatha Martin time: ____________ Date: ____________ Movements: ____________ Start time: ____________ Doreatha Martin time: ____________ Date: ____________ Movements: ____________ Start time: ____________ Doreatha Martin time: ____________ Date: ____________ Movements: ____________ Start time: ____________ Doreatha Martin time: ____________ Date: ____________  Movements: ____________ Start time: ____________ Doreatha Martin time: ____________ Date: ____________ Movements: ____________ Start time: ____________ Doreatha Martin time: ____________ Date: ____________ Movements: ____________ Start time: ____________ Doreatha Martin time: ____________  Date: ____________ Movements: ____________ Start time: ____________ Doreatha Martin time: ____________ Date: ____________ Movements: ____________ Start time: ____________ Doreatha Martin time: ____________ Date: ____________ Movements: ____________ Start time: ____________ Doreatha Martin time: ____________ Date: ____________ Movements: ____________ Start time: ____________ Doreatha Martin time: ____________ Date: ____________ Movements: ____________ Start time: ____________ Doreatha Martin time: ____________ Date: ____________ Movements: ____________ Start time: ____________ Doreatha Martin time: ____________ Date: ____________ Movements: ____________ Start time: ____________ Doreatha Martin time: ____________  Date: ____________ Movements: ____________ Start time: ____________ Doreatha Martin time: ____________ Date: ____________ Movements: ____________ Start time: ____________ Doreatha Martin time: ____________ Date: ____________ Movements: ____________ Start time: ____________ Doreatha Martin time: ____________ Date: ____________ Movements: ____________ Start time: ____________ Doreatha Martin time: ____________ Date: ____________ Movements: ____________ Start time: ____________ Doreatha Martin time: ____________ Date: ____________ Movements: ____________ Start time: ____________ Doreatha Martin time: ____________   This information is not intended to replace advice given to you by your health care provider. Make sure you discuss any questions you have with your health care provider.   Document Released: 02/21/2006 Document Revised: 02/12/2014 Document Reviewed: 11/19/2011 Elsevier Interactive Patient Education [employer]

## 2015-10-20 NOTE — Progress Notes (Signed)
   [facility] Noralee CharsAsiyah Ruth Tully, MD Phone: [phone]  Bruna PotterJasmine Demirjian is a 20 y.o. G1P0 at [redacted]w[redacted]d for routine follow up. She reports no vaginal bleeding, leakage of fluid, or contractions. Denies any dysuria or suprapubic pain. No fevers or chills. Reports good fetal movements.    See flow sheet for details.  Uterus Size 132 cm  FHT 134   A/P: Pregnancy at 758w4d.  Doing well.   Pregnancy issues include patient remains GBS positive despite multiple treatments, Dr. Lum BabeEniola discussed with ID whom recommend Ceftriaxone 1g IM x 3  - Patient had her first shot yesterday,  GBS positive - Ceftriaxone 1 g IM - 2nd shot today, patient to return for 3rd shot tomorrow and follow up in 2 week for urine culture  Infant feeding choice: breast and bottle feeding  Contraception choice reviewed, paper given  Infant circumcision desired yes, at family [facility]   Tdapwas given today. Patient did not want the flu  GBS/GC/CZ testing was not performed today.  Preterm labor precautions reviewed. Safe sleep discussed. Kick counts reviewed. Follow up 4 weeks. Discussed with Dr Pollie MeyerMcIntyre

## 2015-10-29 ENCOUNTER — Inpatient Hospital Stay (HOSPITAL_COMMUNITY)
Admission: AD | Admit: 2015-10-29 | Discharge: 2015-10-30 | Disposition: A | Discharge status: Home / Self Care | Payer: BLUE CROSS/BLUE SHIELD | Source: Ambulatory Visit | Attending: Obstetrics and Gynecology | Admitting: Obstetrics and Gynecology

## 2015-10-29 ENCOUNTER — Encounter (HOSPITAL_COMMUNITY): Payer: Self-pay | Admitting: *Deleted

## 2015-10-29 DIAGNOSIS — O4703 False labor before 37 completed weeks of gestation, third trimester: Secondary | ICD-10-CM | POA: Diagnosis not present

## 2015-10-29 DIAGNOSIS — Z3689 Encounter for other specified antenatal screening: Secondary | ICD-10-CM

## 2015-10-29 DIAGNOSIS — Z87891 Personal history of nicotine dependence: Secondary | ICD-10-CM | POA: Insufficient documentation

## 2015-10-29 DIAGNOSIS — Z3A32 32 weeks gestation of pregnancy: Secondary | ICD-10-CM | POA: Diagnosis not present

## 2015-10-29 DIAGNOSIS — R1030 Lower abdominal pain, unspecified: Secondary | ICD-10-CM | POA: Diagnosis present

## 2015-10-29 DIAGNOSIS — Z3493 Encounter for supervision of normal pregnancy, unspecified, third trimester: Secondary | ICD-10-CM

## 2015-10-29 LAB — URINALYSIS, ROUTINE W REFLEX MICROSCOPIC
BILIRUBIN URINE: NEGATIVE
Glucose, UA: NEGATIVE mg/dL
HGB URINE DIPSTICK: NEGATIVE
Ketones, ur: NEGATIVE mg/dL
NITRITE: NEGATIVE
PROTEIN: NEGATIVE mg/dL
Specific Gravity, Urine: 1.015 (ref 1.005–1.030)
pH: 7 (ref 5.0–8.0)

## 2015-10-29 LAB — URINE MICROSCOPIC-ADD ON: RBC / HPF: NONE SEEN RBC/hpf (ref 0–5)

## 2015-10-29 NOTE — MAU Note (Signed)
No fetal movement since Friday, abdominal pain since 1800, light vaginal bleeding

## 2015-10-30 DIAGNOSIS — O4703 False labor before 37 completed weeks of gestation, third trimester: Secondary | ICD-10-CM

## 2015-10-30 DIAGNOSIS — Z3A32 32 weeks gestation of pregnancy: Secondary | ICD-10-CM

## 2015-10-30 LAB — WET PREP, GENITAL
CLUE CELLS WET PREP: NONE SEEN
SPERM: NONE SEEN
Trich, Wet Prep: NONE SEEN
Yeast Wet Prep HPF POC: NONE SEEN

## 2015-10-30 LAB — FETAL FIBRONECTIN: FETAL FIBRONECTIN: NEGATIVE

## 2015-10-30 NOTE — Discharge Instructions (Signed)
Fetal Movement Counts °Patient Name: __________________________________________________ Patient Due Date: ____________________ °Performing a fetal movement count is highly recommended in high-risk pregnancies, but it is good for every pregnant woman to do. Your health care provider may ask you to start counting fetal movements at 28 weeks of the pregnancy. Fetal movements often increase: °· After eating a full meal. °· After physical activity. °· After eating or drinking something sweet or cold. °· At rest. °Pay attention to when you feel the baby is most active. This will help you notice a pattern of your baby's sleep and wake cycles and what factors contribute to an increase in fetal movement. It is important to perform a fetal movement count at the same time each day when your baby is normally most active.  °HOW TO COUNT FETAL MOVEMENTS °1. Find a quiet and comfortable area to sit or lie down on your left side. Lying on your left side provides the best blood and oxygen circulation to your baby. °2. Write down the day and time on a sheet of paper or in a journal. °3. Start counting kicks, flutters, swishes, rolls, or jabs in a 2-hour period. You should feel at least 10 movements within 2 hours. °4. If you do not feel 10 movements in 2 hours, wait 2-3 hours and count again. Look for a change in the pattern or not enough counts in 2 hours. °SEEK MEDICAL CARE IF: °· You feel less than 10 counts in 2 hours, tried twice. °· There is no movement in over an hour. °· The pattern is changing or taking longer each day to reach 10 counts in 2 hours. °· You feel the baby is not moving as he or she usually does. °Date: ____________ Movements: ____________ Start time: ____________ Finish time: ____________  °Date: ____________ Movements: ____________ Start time: ____________ Finish time: ____________ °Date: ____________ Movements: ____________ Start time: ____________ Finish time: ____________ °Date: ____________ Movements:  ____________ Start time: ____________ Finish time: ____________ °Date: ____________ Movements: ____________ Start time: ____________ Finish time: ____________ °Date: ____________ Movements: ____________ Start time: ____________ Finish time: ____________ °Date: ____________ Movements: ____________ Start time: ____________ Finish time: ____________ °Date: ____________ Movements: ____________ Start time: ____________ Finish time: ____________  °Date: ____________ Movements: ____________ Start time: ____________ Finish time: ____________ °Date: ____________ Movements: ____________ Start time: ____________ Finish time: ____________ °Date: ____________ Movements: ____________ Start time: ____________ Finish time: ____________ °Date: ____________ Movements: ____________ Start time: ____________ Finish time: ____________ °Date: ____________ Movements: ____________ Start time: ____________ Finish time: ____________ °Date: ____________ Movements: ____________ Start time: ____________ Finish time: ____________ °Date: ____________ Movements: ____________ Start time: ____________ Finish time: ____________  °Date: ____________ Movements: ____________ Start time: ____________ Finish time: ____________ °Date: ____________ Movements: ____________ Start time: ____________ Finish time: ____________ °Date: ____________ Movements: ____________ Start time: ____________ Finish time: ____________ °Date: ____________ Movements: ____________ Start time: ____________ Finish time: ____________ °Date: ____________ Movements: ____________ Start time: ____________ Finish time: ____________ °Date: ____________ Movements: ____________ Start time: ____________ Finish time: ____________ °Date: ____________ Movements: ____________ Start time: ____________ Finish time: ____________  °Date: ____________ Movements: ____________ Start time: ____________ Finish time: ____________ °Date: ____________ Movements: ____________ Start time: ____________ Finish  time: ____________ °Date: ____________ Movements: ____________ Start time: ____________ Finish time: ____________ °Date: ____________ Movements: ____________ Start time: ____________ Finish time: ____________ °Date: ____________ Movements: ____________ Start time: ____________ Finish time: ____________ °Date: ____________ Movements: ____________ Start time: ____________ Finish time: ____________ °Date: ____________ Movements: ____________ Start time: ____________ Finish time: ____________  °Date: ____________ Movements: ____________ Start time: ____________ Finish   time: ____________ °Date: ____________ Movements: ____________ Start time: ____________ Finish time: ____________ °Date: ____________ Movements: ____________ Start time: ____________ Finish time: ____________ °Date: ____________ Movements: ____________ Start time: ____________ Finish time: ____________ °Date: ____________ Movements: ____________ Start time: ____________ Finish time: ____________ °Date: ____________ Movements: ____________ Start time: ____________ Finish time: ____________ °Date: ____________ Movements: ____________ Start time: ____________ Finish time: ____________  °Date: ____________ Movements: ____________ Start time: ____________ Finish time: ____________ °Date: ____________ Movements: ____________ Start time: ____________ Finish time: ____________ °Date: ____________ Movements: ____________ Start time: ____________ Finish time: ____________ °Date: ____________ Movements: ____________ Start time: ____________ Finish time: ____________ °Date: ____________ Movements: ____________ Start time: ____________ Finish time: ____________ °Date: ____________ Movements: ____________ Start time: ____________ Finish time: ____________ °Date: ____________ Movements: ____________ Start time: ____________ Finish time: ____________  °Date: ____________ Movements: ____________ Start time: ____________ Finish time: ____________ °Date: ____________  Movements: ____________ Start time: ____________ Finish time: ____________ °Date: ____________ Movements: ____________ Start time: ____________ Finish time: ____________ °Date: ____________ Movements: ____________ Start time: ____________ Finish time: ____________ °Date: ____________ Movements: ____________ Start time: ____________ Finish time: ____________ °Date: ____________ Movements: ____________ Start time: ____________ Finish time: ____________ °Date: ____________ Movements: ____________ Start time: ____________ Finish time: ____________  °Date: ____________ Movements: ____________ Start time: ____________ Finish time: ____________ °Date: ____________ Movements: ____________ Start time: ____________ Finish time: ____________ °Date: ____________ Movements: ____________ Start time: ____________ Finish time: ____________ °Date: ____________ Movements: ____________ Start time: ____________ Finish time: ____________ °Date: ____________ Movements: ____________ Start time: ____________ Finish time: ____________ °Date: ____________ Movements: ____________ Start time: ____________ Finish time: ____________ °  °This information is not intended to replace advice given to you by your health care provider. Make sure you discuss any questions you have with your health care provider. °  °Document Released: 02/21/2006 Document Revised: 02/12/2014 Document Reviewed: 11/19/2011 °[employer] Interactive Patient Education ©2016 [employer] °Braxton Hicks Contractions °Contractions of the uterus can occur throughout pregnancy. Contractions are not always a sign that you are in labor.  °WHAT ARE BRAXTON HICKS CONTRACTIONS?  °Contractions that occur before labor are called Braxton Hicks contractions, or false labor. Toward the end of pregnancy (32-34 weeks), these contractions can develop more often and may become more forceful. This is not true labor because these contractions do not result in opening (dilatation) and thinning of  the cervix. They are sometimes difficult to tell apart from true labor because these contractions can be forceful and people have different pain tolerances. You should not feel embarrassed if you go to the hospital with false labor. Sometimes, the only way to tell if you are in true labor is for your health care provider to look for changes in the cervix. °If there are no prenatal problems or other health problems associated with the pregnancy, it is completely safe to be sent home with false labor and await the onset of true labor. °HOW CAN YOU TELL THE DIFFERENCE BETWEEN TRUE AND FALSE LABOR? °False Labor °· The contractions of false labor are usually shorter and not as hard as those of true labor.   °· The contractions are usually irregular.   °· The contractions are often felt in the front of the lower abdomen and in the groin.   °· The contractions may go away when you walk around or change positions while lying down.   °· The contractions get weaker and are shorter lasting as time goes on.   °· The contractions do not usually become progressively stronger, regular, and closer together as with true labor.   °True Labor °· Contractions in true   labor last 30-70 seconds, become very regular, usually become more intense, and increase in frequency.   °· The contractions do not go away with walking.   °· The discomfort is usually felt in the top of the uterus and spreads to the lower abdomen and low back.   °· True labor can be determined by your health care provider with an exam. This will show that the cervix is dilating and getting thinner.   °WHAT TO REMEMBER °· Keep up with your usual exercises and follow other instructions given by your health care provider.   °· Take medicines as directed by your health care provider.   °· Keep your regular prenatal appointments.   °· Eat and drink lightly if you think you are going into labor.   °· If Braxton Hicks contractions are making you uncomfortable:   °¨ Change your  position from lying down or resting to walking, or from walking to resting.   °¨ Sit and rest in a tub of warm water.   °¨ Drink 2-3 glasses of water. Dehydration may cause these contractions.   °¨ Do slow and deep breathing several times an hour.   °WHEN SHOULD I SEEK IMMEDIATE MEDICAL CARE? °Seek immediate medical care if: °· Your contractions become stronger, more regular, and closer together.   °· You have fluid leaking or gushing from your vagina.   °· You have a fever.   °· You pass blood-tinged mucus.   °· You have vaginal bleeding.   °· You have continuous abdominal pain.   °· You have low back pain that you never had before.   °· You feel your baby's head pushing down and causing pelvic pressure.   °· Your baby is not moving as much as it used to.   °  °This information is not intended to replace advice given to you by your health care provider. Make sure you discuss any questions you have with your health care provider. °  °Document Released: 01/22/2005 Document Revised: 01/27/2013 Document Reviewed: 11/03/2012 °Elsevier Interactive Patient Education ©2016 [employer] ° °

## 2015-10-30 NOTE — MAU Provider Note (Signed)
History     CSN: [id number]  Arrival date and time: 10/29/15 2230   None     No chief complaint on file.  G1 @32  weeks c/o constant lower abdominal pain x7 hrs. She reports pain started after her baby shower today. She reports decreased fetal movement since yesterday but reports good FM since EFM applied here. She also reports light red spotting on toilet paper around 6 pm. She reports diarrhea x1 week. No fevers. She has been exposed to sick children in her family. She reports poor hydration today, one cup of water and some ginger ale.     OB History    Gravida Para Term Preterm AB Living   1 0           SAB TAB Ectopic Multiple Live Births                  Past Medical History:  Diagnosis Date  . Medical history non-contributory     Past Surgical History:  Procedure Laterality Date  . NO PAST SURGERIES      History reviewed. No pertinent family history.  Social History  Substance Use Topics  . Smoking status: Former Smoker    Types: Cigarettes  . Smokeless tobacco: Never Used  . Alcohol use Yes     Comment: social not since pregnacy    Allergies: No Known Allergies  Prescriptions Prior to Admission  Medication Sig Dispense Refill Last Dose  . Prenatal Vit-Fe Fumarate-FA (PRENATAL MULTIVITAMIN) TABS tablet Take 1 tablet by mouth daily at 12 noon.   Taking    Review of Systems  Constitutional: Negative.   Gastrointestinal: Positive for abdominal pain.  Genitourinary: Negative.    Physical Exam   Blood pressure 127/63, pulse 76, temperature 98.6 F (37 C), temperature source Oral, resp. rate 18, last menstrual period 03/20/2015.  Physical Exam  Constitutional: She is oriented to person, place, and time. She appears well-developed and well-nourished.  HENT:  Head: Normocephalic and atraumatic.  Neck: Normal range of motion.  Cardiovascular: Normal rate.   Respiratory: Effort normal.  GI: Soft. She exhibits no distension. There is no tenderness.   gravid  Genitourinary:  Genitourinary Comments: External: no lesions Vagina: rugated, nulli, thick white/yellow discharge, no blood SVE: closed/thick   Musculoskeletal: Normal range of motion.  Neurological: She is alert and oriented to person, place, and time.  Skin: Skin is warm and dry.  Psychiatric: She has a normal mood and affect.  EFM: 1345 bpm, mod variability, + accels, no decels Toco: irregular, irritability  Results for orders placed or performed during the hospital encounter of 10/29/15 (from the past 24 hour(s))  Urinalysis, Routine w reflex microscopic (not at [facility])     Status: Abnormal   Collection Time: 10/29/15 10:50 PM  Result Value Ref Range   Color, Urine YELLOW YELLOW   APPearance CLEAR CLEAR   Specific Gravity, Urine 1.015 1.005 - 1.030   pH 7.0 5.0 - 8.0   Glucose, UA NEGATIVE NEGATIVE mg/dL   Hgb urine dipstick NEGATIVE NEGATIVE   Bilirubin Urine NEGATIVE NEGATIVE   Ketones, ur NEGATIVE NEGATIVE mg/dL   Protein, ur NEGATIVE NEGATIVE mg/dL   Nitrite NEGATIVE NEGATIVE   Leukocytes, UA SMALL (A) NEGATIVE  Urine microscopic-add on     Status: Abnormal   Collection Time: 10/29/15 10:50 PM  Result Value Ref Range   Squamous Epithelial / LPF 6-30 (A) NONE SEEN   WBC, UA 6-30 0 - 5 WBC/hpf   RBC /  HPF NONE SEEN 0 - 5 RBC/hpf   Bacteria, UA MANY (A) NONE SEEN   Urine-Other MUCOUS PRESENT   Wet prep, genital     Status: Abnormal   Collection Time: 10/30/15  1:40 AM  Result Value Ref Range   Yeast Wet Prep HPF POC NONE SEEN NONE SEEN   Trich, Wet Prep NONE SEEN NONE SEEN   Clue Cells Wet Prep HPF POC NONE SEEN NONE SEEN   WBC, Wet Prep HPF POC MODERATE (A) NONE SEEN   Sperm NONE SEEN   Fetal fibronectin     Status: None   Collection Time: 10/30/15  1:40 AM  Result Value Ref Range   Fetal Fibronectin NEGATIVE NEGATIVE     MAU Course  Procedures Po hydration MDM Labs ordered and reviewed. Pt feeling good FM and audible. No evidence of PTL, ctx  likeyl d/t poor hydration and increased physical activity today. Stable for discharge home.  Assessment and Plan   1. Third trimester pregnancy   2. NST (non-stress test) reactive   3. Preterm contractions, third trimester    Discharge home Increase [facility] hydration [facility] as scheduled PTL precautions  Donette LarryMelanie Xiao Graul, CNM 10/30/2015, 1:27 AM

## 2015-10-31 LAB — GC/CHLAMYDIA PROBE AMP (~~LOC~~) NOT AT ARMC
CHLAMYDIA, DNA PROBE: NEGATIVE
Neisseria Gonorrhea: NEGATIVE

## 2015-11-07 ENCOUNTER — Encounter: Payer: BLUE CROSS/BLUE SHIELD | Admitting: Internal Medicine

## 2015-11-11 ENCOUNTER — Ambulatory Visit (INDEPENDENT_AMBULATORY_CARE_PROVIDER_SITE_OTHER): Payer: BLUE CROSS/BLUE SHIELD | Admitting: Internal Medicine

## 2015-11-11 VITALS — BP 120/86 | HR 86 | Temp 99.2°F | Wt 268.0 lb

## 2015-11-11 DIAGNOSIS — Z3402 Encounter for supervision of normal first pregnancy, second trimester: Secondary | ICD-10-CM

## 2015-11-11 NOTE — Patient Instructions (Signed)

## 2015-11-11 NOTE — Progress Notes (Addendum)
Tina Keller is a 20 y.o. G1P0 at 6067w5d for routine follow up.  She reports positive for fetal movement. No vaginal bleeding, no leakage of fluid. Reports having contractions about two weeks ago and was seen in the MAU for this issues. Per Toco, some irregular contractions and uterine irritability, resolved with fluids.    See flow sheet for details.  Vitals:   11/11/15 1430  BP: 120/86  Pulse: 86  Temp: 99.2 F (37.3 C)    FHT 141bpm  Uterine Size: 34 cm  Extremity: Left lower extremity slightly tender, slight erythema consistent with healing wound, no signs of infection currently   A/P: Pregnancy at 1167w5d.  Doing well.   Pregnancy issues include: Dog bite - puppy bite patient last night during play, slightly tender and warm to palpation. TDAP uptodate. Puppy received rabies shot. Patient states rinsed with water and etoh yesterday. Placed antibiotic ointment as well. No fever or chills, No nausea or vomiting. No signs of cellulitis. Discussed with Dr. Deirdre Priesthambliss per evidence base no specific recommendations against or for treating with antibiotics in dog bites.  Discussed with patient and she indicated she would like to hold off. Discussed return precautions for infection, patient voiced understanding.   Infant feeding choice: breast and bottle feeding  Contraception choice: still undecided  Infant circumcision desired yes  Tdapwas given at last visit  GBS/GC/CZ testing was not performed today. Patient received 3 shots of Ceftriaxone for persistently positive GBS urine culture, follow up today with repeat urine culture following completed treatment as recommended by [facility] at next visit  Preterm labor precautions reviewed. Safe sleep discussed. Kick counts reviewed. Follow up 2 weeks.

## 2015-11-13 LAB — CULTURE, OB URINE

## 2015-11-22 ENCOUNTER — Ambulatory Visit (INDEPENDENT_AMBULATORY_CARE_PROVIDER_SITE_OTHER): Payer: BLUE CROSS/BLUE SHIELD | Admitting: Family Medicine

## 2015-11-22 VITALS — BP 125/63 | HR 70 | Temp 98.2°F | Wt 274.2 lb

## 2015-11-22 DIAGNOSIS — Z3493 Encounter for supervision of normal pregnancy, unspecified, third trimester: Secondary | ICD-10-CM

## 2015-11-22 NOTE — Patient Instructions (Addendum)
Follow up in 2 weeks with [facility].  Third Trimester of Pregnancy The third trimester is from week 29 through week 42, months 7 through 9. The third trimester is a time when the fetus is growing rapidly. At the end of the ninth month, the fetus is about 20 inches in length and weighs 6-10 pounds.  BODY CHANGES Your body goes through many changes during pregnancy. The changes vary from woman to woman.   Your weight will continue to increase. You can expect to gain 25-35 pounds (11-16 kg) by the end of the pregnancy.  You may begin to get stretch marks on your hips, abdomen, and breasts.  You may urinate more often because the fetus is moving lower into your pelvis and pressing on your bladder.  You may develop or continue to have heartburn as a result of your pregnancy.  You may develop constipation because certain hormones are causing the muscles that push waste through your intestines to slow down.  You may develop hemorrhoids or swollen, bulging veins (varicose veins).  You may have pelvic pain because of the weight gain and pregnancy hormones relaxing your joints between the bones in your pelvis. Backaches may result from overexertion of the muscles supporting your posture.  You may have changes in your hair. These can include thickening of your hair, rapid growth, and changes in texture. Some women also have hair loss during or after pregnancy, or hair that feels dry or thin. Your hair will most likely return to normal after your baby is born.  Your breasts will continue to grow and be tender. A yellow discharge may leak from your breasts called colostrum.  Your belly button may stick out.  You may feel short of breath because of your expanding uterus.  You may notice the fetus "dropping," or moving lower in your abdomen.  You may have a bloody mucus discharge. This usually occurs a few days to a week before labor begins.  Your cervix becomes thin and soft (effaced) near  your due date. WHAT TO EXPECT AT YOUR PRENATAL EXAMS  You will have prenatal exams every 2 weeks until week 36. Then, you will have weekly prenatal exams. During a routine prenatal visit:  You will be weighed to make sure you and the fetus are growing normally.  Your blood pressure is taken.  Your abdomen will be measured to track your baby's growth.  The fetal heartbeat will be listened to.  Any test results from the previous visit will be discussed.  You may have a cervical check near your due date to see if you have effaced. At around 36 weeks, your caregiver will check your cervix. At the same time, your caregiver will also perform a test on the secretions of the vaginal tissue. This test is to determine if a type of bacteria, Group B streptococcus, is present. Your caregiver will explain this further. Your caregiver may ask you:  What your birth plan is.  How you are feeling.  If you are feeling the baby move.  If you have had any abnormal symptoms, such as leaking fluid, bleeding, severe headaches, or abdominal cramping.  If you are using any tobacco products, including cigarettes, chewing tobacco, and electronic cigarettes.  If you have any questions. Other tests or screenings that may be performed during your third trimester include:  Blood tests that check for low iron levels (anemia).  Fetal testing to check the health, activity level, and growth of the fetus. Testing  is done if you have certain medical conditions or if there are problems during the pregnancy.  HIV (human immunodeficiency virus) testing. If you are at high risk, you may be screened for HIV during your third trimester of pregnancy. FALSE LABOR You may feel small, irregular contractions that eventually go away. These are called Braxton Hicks contractions, or false labor. Contractions may last for hours, days, or even weeks before true labor sets in. If contractions come at regular intervals, intensify, or  become painful, it is best to be seen by your caregiver.  SIGNS OF LABOR   Menstrual-like cramps.  Contractions that are 5 minutes apart or less.  Contractions that start on the top of the uterus and spread down to the lower abdomen and back.  A sense of increased pelvic pressure or back pain.  A watery or bloody mucus discharge that comes from the vagina. If you have any of these signs before the 37th week of pregnancy, call your caregiver right away. You need to go to the hospital to get checked immediately. HOME CARE INSTRUCTIONS   Avoid all smoking, herbs, alcohol, and unprescribed drugs. These chemicals affect the formation and growth of the baby.  Do not use any tobacco products, including cigarettes, chewing tobacco, and electronic cigarettes. If you need help quitting, ask your health care provider. You may receive counseling support and other resources to help you quit.  Follow your caregiver's instructions regarding medicine use. There are medicines that are either safe or unsafe to take during pregnancy.  Exercise only as directed by your caregiver. Experiencing uterine cramps is a good sign to stop exercising.  Continue to eat regular, healthy meals.  Wear a good support bra for breast tenderness.  Do not use hot tubs, steam rooms, or saunas.  Wear your seat belt at all times when driving.  Avoid raw meat, uncooked cheese, cat litter boxes, and soil used by cats. These carry germs that can cause birth defects in the baby.  Take your prenatal vitamins.  Take 1500-2000 mg of calcium daily starting at the 20th week of pregnancy until you deliver your baby.  Try taking a stool softener (if your caregiver approves) if you develop constipation. Eat more high-fiber foods, such as fresh vegetables or fruit and whole grains. Drink plenty of fluids to keep your urine clear or pale yellow.  Take warm sitz baths to soothe any pain or discomfort caused by hemorrhoids. Use  hemorrhoid cream if your caregiver approves.  If you develop varicose veins, wear support hose. Elevate your feet for 15 minutes, 3-4 times a day. Limit salt in your diet.  Avoid heavy lifting, wear low heal shoes, and practice good posture.  Rest a lot with your legs elevated if you have leg cramps or low back pain.  Visit your dentist if you have not gone during your pregnancy. Use a soft toothbrush to brush your teeth and be gentle when you floss.  A sexual relationship may be continued unless your caregiver directs you otherwise.  Do not travel far distances unless it is absolutely necessary and only with the approval of your caregiver.  Take prenatal classes to understand, practice, and ask questions about the labor and delivery.  Make a trial run to the hospital.  Pack your hospital bag.  Prepare the baby's nursery.  Continue to go to all your prenatal visits as directed by your caregiver. SEEK MEDICAL CARE IF:  You are unsure if you are in labor or if your  water has broken.  You have dizziness.  You have mild pelvic cramps, pelvic pressure, or nagging pain in your abdominal area.  You have persistent nausea, vomiting, or diarrhea.  You have a bad smelling vaginal discharge.  You have pain with urination. SEEK IMMEDIATE MEDICAL CARE IF:   You have a fever.  You are leaking fluid from your vagina.  You have spotting or bleeding from your vagina.  You have severe abdominal cramping or pain.  You have rapid weight loss or gain.  You have shortness of breath with chest pain.  You notice sudden or extreme swelling of your face, hands, ankles, feet, or legs.  You have not felt your baby move in over an hour.  You have severe headaches that do not go away with medicine.  You have vision changes.   This information is not intended to replace advice given to you by your health care provider. Make sure you discuss any questions you have with your health care  provider.   Document Released: 01/16/2001 Document Revised: 02/12/2014 Document Reviewed: 03/25/2012 Elsevier Interactive Patient Education [employer] Inc[employer]

## 2015-11-23 NOTE — Progress Notes (Signed)
Tina PotterJasmine Witzke is a 20 y.o. G1P0 at 5019w3d for routine follow up.  She reports no acute concerns today. Denies vaginal bleeding or discharge. Denies contractions or abdominal pain. Denies loss of fluid. Continues to note fetal movement.  See flow sheet for details.  A/P: Pregnancy at 3019w3d.  Doing well.    Infant feeding choice: Breast/Bottle Contraception choice: Undecided Infant circumcision desired yes--at [facility]  Tdap given at previous visit. GBS/GC/CZ testing at next office visit  Urine Culture from last office visit reviewed. Multiple organisms present, but none in high quantities. No further workup needed unless clinically indicated in future.  Preterm labor precautions reviewed. Safe sleep discussed. Kick counts reviewed. Follow up 2 weeks with OB clinic

## 2015-11-25 ENCOUNTER — Ambulatory Visit: Payer: BLUE CROSS/BLUE SHIELD | Admitting: Family Medicine

## 2015-12-01 DIAGNOSIS — O09899 Supervision of other high risk pregnancies, unspecified trimester: Secondary | ICD-10-CM | POA: Insufficient documentation

## 2015-12-01 DIAGNOSIS — Z283 Underimmunization status: Secondary | ICD-10-CM | POA: Insufficient documentation

## 2015-12-01 DIAGNOSIS — Z2839 Other underimmunization status: Secondary | ICD-10-CM

## 2015-12-01 DIAGNOSIS — O9989 Other specified diseases and conditions complicating pregnancy, childbirth and the puerperium: Secondary | ICD-10-CM

## 2015-12-01 HISTORY — DX: Supervision of other high risk pregnancies, unspecified trimester: O09.899

## 2015-12-01 HISTORY — DX: Other underimmunization status: Z28.39

## 2015-12-09 ENCOUNTER — Encounter: Payer: Self-pay | Admitting: Internal Medicine

## 2015-12-09 ENCOUNTER — Other Ambulatory Visit (HOSPITAL_COMMUNITY)
Admission: RE | Admit: 2015-12-09 | Discharge: 2015-12-09 | Disposition: A | Discharge status: Home / Self Care | Payer: BLUE CROSS/BLUE SHIELD | Source: Ambulatory Visit | Attending: Family Medicine | Admitting: Family Medicine

## 2015-12-09 ENCOUNTER — Ambulatory Visit (INDEPENDENT_AMBULATORY_CARE_PROVIDER_SITE_OTHER): Payer: BLUE CROSS/BLUE SHIELD | Admitting: Internal Medicine

## 2015-12-09 DIAGNOSIS — B951 Streptococcus, group B, as the cause of diseases classified elsewhere: Secondary | ICD-10-CM

## 2015-12-09 DIAGNOSIS — Z3402 Encounter for supervision of normal first pregnancy, second trimester: Secondary | ICD-10-CM

## 2015-12-09 DIAGNOSIS — Z113 Encounter for screening for infections with a predominantly sexual mode of transmission: Secondary | ICD-10-CM | POA: Diagnosis present

## 2015-12-09 HISTORY — DX: Streptococcus, group b, as the cause of diseases classified elsewhere: B95.1

## 2015-12-09 NOTE — Progress Notes (Signed)
Tina PotterJasmine Keller is a 20 y.o. G1P0 at 6379w5d for routine follow up.  She reports no vaginal bleeding, no leakage of fluid, and no abdominal pain/contractions.  Fetal movement +   See flow sheet for details.  A/P: Pregnancy at 4979w5d.  Doing well.    UT size: 38 cm  FHR: 140 bpm  Ultrasound confirmed: cephalic presentation   Pregnancy issues include none.  Infant feeding choice Breast/Bottle  Contraception choice Undecided  [facility] GBS/GC/CZ results were obtained today. GBS positive per past urine culture. Obtained GC/CZ   Offered flu vaccine, patient denied wanting Flu  Labor precautions reviewed. Kick counts reviewed. Follow up in 1 week.

## 2015-12-09 NOTE — Patient Instructions (Signed)
Please see me for your next appointment in 1 week.  Third Trimester of Pregnancy The third trimester is from week 29 through week 42, months 7 through 9. The third trimester is a time when the fetus is growing rapidly. At the end of the ninth month, the fetus is about 20 inches in length and weighs 6-10 pounds.  BODY CHANGES Your body goes through many changes during pregnancy. The changes vary from woman to woman.   Your weight will continue to increase. You can expect to gain 25-35 pounds (11-16 kg) by the end of the pregnancy.  You may begin to get stretch marks on your hips, abdomen, and breasts.  You may urinate more often because the fetus is moving lower into your pelvis and pressing on your bladder.  You may develop or continue to have heartburn as a result of your pregnancy.  You may develop constipation because certain hormones are causing the muscles that push waste through your intestines to slow down.  You may develop hemorrhoids or swollen, bulging veins (varicose veins).  You may have pelvic pain because of the weight gain and pregnancy hormones relaxing your joints between the bones in your pelvis. Backaches may result from overexertion of the muscles supporting your posture.  You may have changes in your hair. These can include thickening of your hair, rapid growth, and changes in texture. Some women also have hair loss during or after pregnancy, or hair that feels dry or thin. Your hair will most likely return to normal after your baby is born.  Your breasts will continue to grow and be tender. A yellow discharge may leak from your breasts called colostrum.  Your belly button may stick out.  You may feel short of breath because of your expanding uterus.  You may notice the fetus "dropping," or moving lower in your abdomen.  You may have a bloody mucus discharge. This usually occurs a few days to a week before labor begins.  Your cervix becomes thin and soft  (effaced) near your due date. WHAT TO EXPECT AT YOUR PRENATAL EXAMS  You will have prenatal exams every 2 weeks until week 36. Then, you will have weekly prenatal exams. During a routine prenatal visit:  You will be weighed to make sure you and the fetus are growing normally.  Your blood pressure is taken.  Your abdomen will be measured to track your baby's growth.  The fetal heartbeat will be listened to.  Any test results from the previous visit will be discussed.  You may have a cervical check near your due date to see if you have effaced. At around 36 weeks, your caregiver will check your cervix. At the same time, your caregiver will also perform a test on the secretions of the vaginal tissue. This test is to determine if a type of bacteria, Group B streptococcus, is present. Your caregiver will explain this further. Your caregiver may ask you:  What your birth plan is.  How you are feeling.  If you are feeling the baby move.  If you have had any abnormal symptoms, such as leaking fluid, bleeding, severe headaches, or abdominal cramping.  If you are using any tobacco products, including cigarettes, chewing tobacco, and electronic cigarettes.  If you have any questions. Other tests or screenings that may be performed during your third trimester include:  Blood tests that check for low iron levels (anemia).  Fetal testing to check the health, activity level, and growth of the fetus.  Testing is done if you have certain medical conditions or if there are problems during the pregnancy.  HIV (human immunodeficiency virus) testing. If you are at high risk, you may be screened for HIV during your third trimester of pregnancy. FALSE LABOR You may feel small, irregular contractions that eventually go away. These are called Braxton Hicks contractions, or false labor. Contractions may last for hours, days, or even weeks before true labor sets in. If contractions come at regular  intervals, intensify, or become painful, it is best to be seen by your caregiver.  SIGNS OF LABOR   Menstrual-like cramps.  Contractions that are 5 minutes apart or less.  Contractions that start on the top of the uterus and spread down to the lower abdomen and back.  A sense of increased pelvic pressure or back pain.  A watery or bloody mucus discharge that comes from the vagina. If you have any of these signs before the 37th week of pregnancy, call your caregiver right away. You need to go to the hospital to get checked immediately. HOME CARE INSTRUCTIONS   Avoid all smoking, herbs, alcohol, and unprescribed drugs. These chemicals affect the formation and growth of the baby.  Do not use any tobacco products, including cigarettes, chewing tobacco, and electronic cigarettes. If you need help quitting, ask your health care provider. You may receive counseling support and other resources to help you quit.  Follow your caregiver's instructions regarding medicine use. There are medicines that are either safe or unsafe to take during pregnancy.  Exercise only as directed by your caregiver. Experiencing uterine cramps is a good sign to stop exercising.  Continue to eat regular, healthy meals.  Wear a good support bra for breast tenderness.  Do not use hot tubs, steam rooms, or saunas.  Wear your seat belt at all times when driving.  Avoid raw meat, uncooked cheese, cat litter boxes, and soil used by cats. These carry germs that can cause birth defects in the baby.  Take your prenatal vitamins.  Take 1500-2000 mg of calcium daily starting at the 20th week of pregnancy until you deliver your baby.  Try taking a stool softener (if your caregiver approves) if you develop constipation. Eat more high-fiber foods, such as fresh vegetables or fruit and whole grains. Drink plenty of fluids to keep your urine clear or pale yellow.  Take warm sitz baths to soothe any pain or discomfort caused  by hemorrhoids. Use hemorrhoid cream if your caregiver approves.  If you develop varicose veins, wear support hose. Elevate your feet for 15 minutes, 3-4 times a day. Limit salt in your diet.  Avoid heavy lifting, wear low heal shoes, and practice good posture.  Rest a lot with your legs elevated if you have leg cramps or low back pain.  Visit your dentist if you have not gone during your pregnancy. Use a soft toothbrush to brush your teeth and be gentle when you floss.  A sexual relationship may be continued unless your caregiver directs you otherwise.  Do not travel far distances unless it is absolutely necessary and only with the approval of your caregiver.  Take prenatal classes to understand, practice, and ask questions about the labor and delivery.  Make a trial run to the hospital.  Pack your hospital bag.  Prepare the baby's nursery.  Continue to go to all your prenatal visits as directed by your caregiver. SEEK MEDICAL CARE IF:  You are unsure if you are in labor or if  your water has broken.  You have dizziness.  You have mild pelvic cramps, pelvic pressure, or nagging pain in your abdominal area.  You have persistent nausea, vomiting, or diarrhea.  You have a bad smelling vaginal discharge.  You have pain with urination. SEEK IMMEDIATE MEDICAL CARE IF:   You have a fever.  You are leaking fluid from your vagina.  You have spotting or bleeding from your vagina.  You have severe abdominal cramping or pain.  You have rapid weight loss or gain.  You have shortness of breath with chest pain.  You notice sudden or extreme swelling of your face, hands, ankles, feet, or legs.  You have not felt your baby move in over an hour.  You have severe headaches that do not go away with medicine.  You have vision changes.   This information is not intended to replace advice given to you by your health care provider. Make sure you discuss any questions you have with  your health care provider.   Document Released: 01/16/2001 Document Revised: 02/12/2014 Document Reviewed: 03/25/2012 Elsevier Interactive Patient Education [employer] Inc.

## 2015-12-12 LAB — CERVICOVAGINAL ANCILLARY ONLY
CHLAMYDIA, DNA PROBE: NEGATIVE
NEISSERIA GONORRHEA: NEGATIVE

## 2015-12-19 ENCOUNTER — Encounter: Payer: BLUE CROSS/BLUE SHIELD | Admitting: Internal Medicine

## 2015-12-23 ENCOUNTER — Encounter (HOSPITAL_COMMUNITY): Payer: Self-pay | Admitting: *Deleted

## 2015-12-23 ENCOUNTER — Inpatient Hospital Stay (HOSPITAL_COMMUNITY): Payer: BLUE CROSS/BLUE SHIELD | Admitting: Anesthesiology

## 2015-12-23 ENCOUNTER — Inpatient Hospital Stay (HOSPITAL_COMMUNITY)
Admission: AD | Admit: 2015-12-23 | Discharge: 2015-12-25 | DRG: 775 | Disposition: A | Discharge status: Home / Self Care | Payer: BLUE CROSS/BLUE SHIELD | Source: Ambulatory Visit | Attending: Obstetrics & Gynecology | Admitting: Obstetrics & Gynecology

## 2015-12-23 DIAGNOSIS — Z87891 Personal history of nicotine dependence: Secondary | ICD-10-CM | POA: Diagnosis not present

## 2015-12-23 DIAGNOSIS — O4290 Premature rupture of membranes, unspecified as to length of time between rupture and onset of labor, unspecified weeks of gestation: Secondary | ICD-10-CM | POA: Diagnosis present

## 2015-12-23 DIAGNOSIS — O4202 Full-term premature rupture of membranes, onset of labor within 24 hours of rupture: Principal | ICD-10-CM | POA: Diagnosis present

## 2015-12-23 DIAGNOSIS — Z3A39 39 weeks gestation of pregnancy: Secondary | ICD-10-CM | POA: Diagnosis not present

## 2015-12-23 DIAGNOSIS — Z23 Encounter for immunization: Secondary | ICD-10-CM

## 2015-12-23 DIAGNOSIS — O99824 Streptococcus B carrier state complicating childbirth: Secondary | ICD-10-CM | POA: Diagnosis present

## 2015-12-23 LAB — ABO/RH: ABO/RH(D): O POS

## 2015-12-23 LAB — TYPE AND SCREEN
ABO/RH(D): O POS
Antibody Screen: NEGATIVE

## 2015-12-23 LAB — CBC
HEMATOCRIT: 40 % (ref 36.0–46.0)
Hemoglobin: 13.5 g/dL (ref 12.0–15.0)
MCH: 28.3 pg (ref 26.0–34.0)
MCHC: 33.8 g/dL (ref 30.0–36.0)
MCV: 83.9 fL (ref 78.0–100.0)
Platelets: 185 10*3/uL (ref 150–400)
RBC: 4.77 MIL/uL (ref 3.87–5.11)
RDW: 13.5 % (ref 11.5–15.5)
WBC: 9.3 10*3/uL (ref 4.0–10.5)

## 2015-12-23 LAB — OB RESULTS CONSOLE GBS: GBS: POSITIVE

## 2015-12-23 LAB — POCT FERN TEST: POCT FERN TEST: POSITIVE

## 2015-12-23 MED ORDER — OXYCODONE-ACETAMINOPHEN 5-325 MG PO TABS: 2.0000 | ORAL_TABLET | ORAL | Status: DC | PRN

## 2015-12-23 MED ORDER — SODIUM BICARBONATE 8.4 % IV SOLN: INTRAVENOUS | Status: DC | PRN

## 2015-12-23 MED ORDER — PHENYLEPHRINE 40 MCG/ML (10ML) SYRINGE FOR IV PUSH (FOR BLOOD PRESSURE SUPPORT)
80.0000 ug | PREFILLED_SYRINGE | INTRAVENOUS | Status: DC | PRN
  Filled 2015-12-23: qty 5

## 2015-12-23 MED ORDER — FENTANYL 2.5 MCG/ML BUPIVACAINE 1/10 % EPIDURAL INFUSION (WH - ANES)
14.0000 mL/h | INTRAMUSCULAR | Status: DC | PRN
  Administered 2015-12-23 (×2): 14 mL/h via EPIDURAL
  Filled 2015-12-23 (×2): qty 100

## 2015-12-23 MED ORDER — DIPHENHYDRAMINE HCL 50 MG/ML IJ SOLN: 12.5000 mg | INTRAMUSCULAR | Status: DC | PRN

## 2015-12-23 MED ORDER — PHENYLEPHRINE 40 MCG/ML (10ML) SYRINGE FOR IV PUSH (FOR BLOOD PRESSURE SUPPORT)
80.0000 ug | PREFILLED_SYRINGE | INTRAVENOUS | Status: DC | PRN
  Filled 2015-12-23: qty 10
  Filled 2015-12-23: qty 5

## 2015-12-23 MED ORDER — LACTATED RINGERS IV SOLN: 500.0000 mL | Freq: Once | INTRAVENOUS | Status: DC

## 2015-12-23 MED ORDER — EPHEDRINE 5 MG/ML INJ
10.0000 mg | INTRAVENOUS | Status: DC | PRN
  Filled 2015-12-23: qty 4

## 2015-12-23 MED ORDER — SOD CITRATE-CITRIC ACID 500-334 MG/5ML PO SOLN: 30.0000 mL | ORAL | Status: DC | PRN

## 2015-12-23 MED ORDER — LACTATED RINGERS IV SOLN: 500.0000 mL | INTRAVENOUS | Status: DC | PRN

## 2015-12-23 MED ORDER — PENICILLIN G POT IN DEXTROSE 60000 UNIT/ML IV SOLN
3.0000 10*6.[IU] | INTRAVENOUS | Status: DC
  Administered 2015-12-23 (×2): 3 10*6.[IU] via INTRAVENOUS
  Filled 2015-12-23 (×6): qty 50

## 2015-12-23 MED ORDER — LIDOCAINE HCL (PF) 1 % IJ SOLN
30.0000 mL | INTRAMUSCULAR | Status: DC | PRN
  Filled 2015-12-23: qty 30

## 2015-12-23 MED ORDER — LACTATED RINGERS IV SOLN
INTRAVENOUS | Status: DC
  Administered 2015-12-23: 12:00:00 via INTRAVENOUS
  Administered 2015-12-23: 125 mL/h via INTRAVENOUS

## 2015-12-23 MED ORDER — ONDANSETRON HCL 4 MG/2ML IJ SOLN
4.0000 mg | Freq: Four times a day (QID) | INTRAMUSCULAR | Status: DC | PRN
  Administered 2015-12-23: 4 mg via INTRAVENOUS
  Filled 2015-12-23: qty 2

## 2015-12-23 MED ORDER — OXYCODONE-ACETAMINOPHEN 5-325 MG PO TABS: 1.0000 | ORAL_TABLET | ORAL | Status: DC | PRN

## 2015-12-23 MED ORDER — ACETAMINOPHEN 325 MG PO TABS: 650.0000 mg | ORAL_TABLET | ORAL | Status: DC | PRN

## 2015-12-23 MED ORDER — OXYTOCIN BOLUS FROM INFUSION
500.0000 mL | Freq: Once | INTRAVENOUS | Status: AC
  Administered 2015-12-23: 500 mL via INTRAVENOUS

## 2015-12-23 MED ORDER — OXYTOCIN 40 UNITS IN LACTATED RINGERS INFUSION - SIMPLE MED
2.5000 [IU]/h | INTRAVENOUS | Status: DC
Start: 2015-12-23 — End: 2015-12-24
  Administered 2015-12-23: 2.5 [IU]/h via INTRAVENOUS
  Filled 2015-12-23: qty 1000

## 2015-12-23 MED ORDER — PENICILLIN G POTASSIUM 5000000 UNITS IJ SOLR
5.0000 10*6.[IU] | Freq: Once | INTRAVENOUS | Status: AC
  Administered 2015-12-23: 5 10*6.[IU] via INTRAVENOUS
  Filled 2015-12-23: qty 5

## 2015-12-23 MED ORDER — FENTANYL CITRATE (PF) 100 MCG/2ML IJ SOLN
100.0000 ug | INTRAMUSCULAR | Status: DC | PRN
  Administered 2015-12-23 (×3): 100 ug via INTRAVENOUS
  Filled 2015-12-23 (×3): qty 2

## 2015-12-23 MED ORDER — LIDOCAINE HCL (PF) 1 % IJ SOLN
INTRAMUSCULAR | Status: DC | PRN
  Administered 2015-12-23 (×4): 4 mL via EPIDURAL

## 2015-12-23 NOTE — Anesthesia Procedure Notes (Signed)
Epidural Patient location during procedure: OB Start time: 12/23/2015 4:04 PM  Staffing Anesthesiologist: Mal AmabileFOSTER, Dan Scearce Performed: anesthesiologist   Preanesthetic Checklist Completed: patient identified, site marked, surgical consent, pre-op evaluation, timeout performed, IV checked, risks and benefits discussed and monitors and equipment checked  Epidural Patient position: sitting Prep: site prepped and draped and DuraPrep Patient monitoring: continuous pulse ox and blood pressure Approach: midline Location: L3-L4 Injection technique: LOR air  Needle:  Needle type: Tuohy  Needle gauge: 17 G Needle length: 9 cm and 9 Needle insertion depth: 7 cm Catheter type: closed end flexible Catheter size: 19 Gauge Catheter at skin depth: 12 cm Test dose: negative and Other  Assessment Events: blood not aspirated, injection not painful, no injection resistance, negative IV test and no paresthesia  Additional Notes Patient identified. Risks and benefits discussed including failed block, incomplete  Pain control, post dural puncture headache, nerve damage, paralysis, blood pressure Changes, nausea, vomiting, reactions to medications-both toxic and allergic and post Partum back pain. All questions were answered. Patient expressed understanding and wished to proceed. Sterile technique was used throughout procedure. Epidural site was Dressed with sterile barrier dressing. No paresthesias, signs of intravascular injection Or signs of intrathecal spread were encountered.  Patient was more comfortable after the epidural was dosed. Please see RN's note for documentation of vital signs and FHR which are stable.

## 2015-12-23 NOTE — Anesthesia Pain Management Evaluation Note (Signed)
  CRNA Pain Management Visit Note  Patient: Tina PotterJasmine Silos, 20 y.o., female  "Hello I am a [facility] of the anesthesia team at [facility]. We have an anesthesia team available at all times to provide care throughout the hospital, including epidural management and anesthesia for C-section. I don't know your plan for the delivery whether it a natural birth, water birth, IV sedation, nitrous supplementation, doula or epidural, but we want to meet your pain goals."   1.Was your pain managed to your expectations on prior hospitalizations?   No prior hospitalizations  2.What is your expectation for pain management during this hospitalization?     Epidural  3.How can we help you reach that goal?   Record the patient's initial score and the patient's pain goal.   Pain: 0  Pain Goal: 5 [facility] wants you to be able to say your pain was always managed very well.  Laban EmperorMalinova,Brayen Bunn Hristova 12/23/2015

## 2015-12-23 NOTE — Anesthesia Procedure Notes (Signed)
Epidural Patient location during procedure: OB Start time: 12/23/2015 3:25 PM  Staffing Anesthesiologist: Mal AmabileFOSTER, Maleiya Pergola Performed: anesthesiologist   Preanesthetic Checklist Completed: patient identified, site marked, surgical consent, pre-op evaluation, timeout performed, IV checked, risks and benefits discussed and monitors and equipment checked  Epidural Patient position: sitting Prep: site prepped and draped and DuraPrep Patient monitoring: continuous pulse ox and blood pressure Approach: midline Location: L3-L4 Injection technique: LOR air  Needle:  Needle type: Tuohy  Needle gauge: 17 G Needle length: 9 cm and 9 Needle insertion depth: 4 cm Catheter type: closed end flexible Catheter size: 19 Gauge Catheter at skin depth: 10 cm Test dose: negative and Other  Assessment Events: blood not aspirated, injection not painful, no injection resistance, negative IV test and no paresthesia  Additional Notes Patient identified. Risks and benefits discussed including failed block, incomplete  Pain control, post dural puncture headache, nerve damage, paralysis, blood pressure Changes, nausea, vomiting, reactions to medications-both toxic and allergic and post Partum back pain. All questions were answered. Patient expressed understanding and wished to proceed. Sterile technique was used throughout procedure. Epidural site was Dressed with sterile barrier dressing. No paresthesias, signs of intravascular injection Or signs of intrathecal spread were encountered. Seemed shallow for LOR. Patient was more comfortable after the epidural was dosed. Please see RN's note for documentation of vital signs and FHR which are stable.

## 2015-12-23 NOTE — Progress Notes (Signed)
Tina Keller is a 20 y.o. G1P0 at 6946w5d by LMP admitted for SROM at 1000 this AM.    Subjective: Patient seen by me this evening, is comfortable.  Received epidural.  Does not have any pain or pressure at this time.  States she vomited 3 times earlier in the day but has not had any vomiting since coming to the birthing suite.  Progressed to 5.5 cm.  Will continue to monitor.   Objective: BP 133/84   Pulse 97   Temp 97.6 F (36.4 C) (Oral)   Resp 16   Ht 5\' 7"  (1.702 m)   Wt 125.6 kg (277 lb)   LMP 03/20/2015   SpO2 100%   BMI 43.38 kg/m   FHT:  FHR: 120s bpm, variability: moderate,  accelerations:  Present,  decelerations:  Absent UC:   regular, every 2-3 minutes SVE:   Dilation: 8 Effacement (%): 100 Station: 0 Exam by:: lee  Labs: Lab Results  Component Value Date   WBC 9.3 12/23/2015   HGB 13.5 12/23/2015   HCT 40.0 12/23/2015   MCV 83.9 12/23/2015   PLT 185 12/23/2015    Assessment / Plan: Spontaneous labor, progressing normally  Labor: Progressing normally Preeclampsia:  No Fetal Wellbeing:  Category I Pain Control:  Epidural, Fentanyl I/D:  GBS positive Anticipated MOD:  NSVD  Freddrick MarchYashika Amin 12/23/2015, 6:58 PM   I have seen and examined this patient and I agree with the above. Cam HaiSHAW, Shanah Guimaraes CNM 8:40 PM 12/23/2015

## 2015-12-23 NOTE — Anesthesia Preprocedure Evaluation (Signed)
Anesthesia Evaluation  Patient identified by MRN, date of birth, ID band Patient awake    Reviewed: Allergy & Precautions, NPO status , Patient's Chart, lab work & pertinent test results  Airway Mallampati: II       Dental no notable dental hx. (+) Teeth Intact   Pulmonary former smoker,    Pulmonary exam normal breath sounds clear to auscultation       Cardiovascular negative cardio ROS Normal cardiovascular exam Rhythm:Regular Rate:Normal     Neuro/Psych negative neurological ROS  negative psych ROS   GI/Hepatic negative GI ROS, Neg liver ROS,   Endo/Other  Morbid obesity  Renal/GU negative Renal ROS  negative genitourinary   Musculoskeletal negative musculoskeletal ROS (+)   Abdominal (+) + obese,   Peds  Hematology negative hematology ROS (+)   Anesthesia Other Findings   Reproductive/Obstetrics (+) Pregnancy                             Anesthesia Physical Anesthesia Plan  ASA: III  Anesthesia Plan: Epidural   Post-op Pain Management:    Induction:   Airway Management Planned: Natural Airway  Additional Equipment:   Intra-op Plan:   Post-operative Plan:   Informed Consent: I have reviewed the patients History and Physical, chart, labs and discussed the procedure including the risks, benefits and alternatives for the proposed anesthesia with the patient or authorized representative who has indicated his/her understanding and acceptance.     Plan Discussed with: Anesthesiologist  Anesthesia Plan Comments:         Anesthesia Quick Evaluation

## 2015-12-23 NOTE — H&P (Signed)
LABOR AND DELIVERY ADMISSION HISTORY AND PHYSICAL NOTE  Tina Keller is a 20 y.o. female G1P0 with IUP at 45w5dby ultrasound presenting for SROM at 1000 this AM.  Contractions started at 0900, Q5 min.    She reports positive fetal movement.   Prenatal History/Complications:  Past Medical History: Past Medical History:  Diagnosis Date  . Medical history non-contributory    Past Surgical History: Past Surgical History:  Procedure Laterality Date  . NO PAST SURGERIES     Obstetrical History: OB History    Gravida Para Term Preterm AB Living   1 0           SAB TAB Ectopic Multiple Live Births                 Social History: Social History   Social History  . Marital status: Single    Spouse name: N/A  . Number of children: N/A  . Years of education: N/A   Social History Main Topics  . Smoking status: Former Smoker    Types: Cigarettes  . Smokeless tobacco: Never Used  . Alcohol use Yes     Comment: social not since pregnacy  . Drug use: No  . Sexual activity: Yes    Birth control/ protection: None   Other Topics Concern  . None   Social History Narrative  . None   Family History: History reviewed. No pertinent family history.  Allergies: No Known Allergies  Prescriptions Prior to Admission  Medication Sig Dispense Refill Last Dose  . Calcium Citrate-Vitamin D (CALCIUM + D PO) Take 1 tablet by mouth daily.   12/22/2015 at Unknown time  . Prenatal Vit-Fe Fumarate-FA (PRENATAL MULTIVITAMIN) TABS tablet Take 1 tablet by mouth daily at 12 noon.   12/22/2015 at Unknown time   Review of Systems   All systems reviewed and negative except as stated in HPI  Blood pressure 134/83, pulse 78, temperature 98 F (36.7 C), resp. rate 18, weight 125.8 kg (277 lb 6.4 oz), last menstrual period 03/20/2015. General appearance: alert and cooperative Lungs: clear to auscultation bilaterally Heart: regular rate and rhythm Abdomen: soft, non-tender; bowel sounds  normal Extremities: No calf swelling or tenderness Presentation: cephalic  Fetal monitoring: baseline 130s, moderate variability, +accels, no decels.  Uterine activity: irregular Dilation: 1.5 Effacement (%): 50 Station: -3 Exam by:: KDaneil Dan RNC   Prenatal labs: ABO, Rh: O/POS/-- (03/31 1427) Antibody: NEG (03/31 1427) Rubella: !Error! RPR: NON REAC (08/25 1336)  HBsAg: NEGATIVE (03/31 1427)  HIV: NONREACTIVE (08/25 1336)  GBS:   Positive 1 hr Glucola: Early 85 Genetic screening:  Quad  Prenatal Transfer Tool  Maternal Diabetes: No Genetic Screening: Normal Maternal Ultrasounds/Referrals: Normal Fetal Ultrasounds or other Referrals:  None Maternal Substance Abuse:  No Significant Maternal Medications:  None Significant Maternal Lab Results: Lab values include: Group B Strep positive  Results for orders placed or performed during the hospital encounter of 12/23/15 (from the past 24 hour(s))  Fern Test   Collection Time: 12/23/15 11:38 AM  Result Value Ref Range   POCT Fern Test Positive = ruptured amniotic membanes     Patient Active Problem List   Diagnosis Date Noted  . Amniotic fluid leaking 12/23/2015  . Positive GBS test 12/09/2015  . Rubella non-immune status, antepartum 12/01/2015  . Encounter for supervision of normal first pregnancy in second trimester 09/02/2015  . Yeast vaginitis 08/04/2015  . Dermatitis 12/18/2013  . Seasonal allergies 01/16/2013  . Skin change 02/01/2012  .  TOE INJURY 07/15/2009  . CHILDHOOD OBESITY 11/26/2006   Assessment: Tina Keller is a 20 y.o. G1P0 at 80w5dhere for SROM.   #Labor: Latent phase #Pain: Will assess as needed #FWB: Cat I #ID:  GBS POS #MOF: bottle and breast #MOC: not determined #Circ:  outpatient  YLovenia Kim MD PGY-1 12/23/2015, 12:33 PM   CNM attestation:  I have seen and examined this patient; I agree with above documentation in the resident's note.   Tina Peeryis a 20y.o. G1P0  here for SROM_0   PE: BP 140/82   Pulse 99   Temp 98.5 F (36.9 C) (Oral)   Resp 16   Ht _1  (1.702 m)   Wt 125.6 kg (277 lb)   LMP 03/20/2015   SpO2 100%   BMI 43.38 kg/m  Gen: calm comfortable, NAD Resp: normal effort, no distress Abd: gravid  ROS, labs, PMH reviewed  Plan: [facility] to [facility] for GBS ppx Anticipate SVD Plan to give MMR postpartum  SSerita GrammesCNM 12/23/2015, 8:41 PM

## 2015-12-23 NOTE — MAU Note (Signed)
Started contracting at 0900, coming ever 5 min.  Water broke around 1000, fluid has green in it. No bleeding.

## 2015-12-23 NOTE — Progress Notes (Signed)
Report to [place], Charity fundraiserN on birthing suites.  Waiting for room assignment and call back.

## 2015-12-24 ENCOUNTER — Encounter (HOSPITAL_COMMUNITY): Payer: Self-pay

## 2015-12-24 LAB — RPR: RPR Ser Ql: NONREACTIVE

## 2015-12-24 MED ORDER — SENNOSIDES-DOCUSATE SODIUM 8.6-50 MG PO TABS
2.0000 | ORAL_TABLET | ORAL | Status: DC
  Administered 2015-12-24 – 2015-12-25 (×2): 2 via ORAL
  Filled 2015-12-24 (×2): qty 2

## 2015-12-24 MED ORDER — PRENATAL MULTIVITAMIN CH
1.0000 | ORAL_TABLET | Freq: Every day | ORAL | Status: DC
  Administered 2015-12-24 – 2015-12-25 (×2): 1 via ORAL
  Filled 2015-12-24 (×2): qty 1

## 2015-12-24 MED ORDER — BENZOCAINE-MENTHOL 20-0.5 % EX AERO
1.0000 "application " | INHALATION_SPRAY | CUTANEOUS | Status: DC | PRN
  Administered 2015-12-24: 1 via TOPICAL
  Filled 2015-12-24: qty 56

## 2015-12-24 MED ORDER — ACETAMINOPHEN 325 MG PO TABS: 650.0000 mg | ORAL_TABLET | ORAL | Status: DC | PRN

## 2015-12-24 MED ORDER — ZOLPIDEM TARTRATE 5 MG PO TABS: 5.0000 mg | ORAL_TABLET | Freq: Every evening | ORAL | Status: DC | PRN

## 2015-12-24 MED ORDER — SIMETHICONE 80 MG PO CHEW: 80.0000 mg | CHEWABLE_TABLET | ORAL | Status: DC | PRN

## 2015-12-24 MED ORDER — COCONUT OIL OIL: 1.0000 "application " | TOPICAL_OIL | Status: DC | PRN

## 2015-12-24 MED ORDER — IBUPROFEN 600 MG PO TABS
600.0000 mg | ORAL_TABLET | Freq: Four times a day (QID) | ORAL | Status: DC
  Administered 2015-12-24 – 2015-12-25 (×7): 600 mg via ORAL
  Filled 2015-12-24 (×7): qty 1

## 2015-12-24 MED ORDER — ONDANSETRON HCL 4 MG/2ML IJ SOLN: 4.0000 mg | INTRAMUSCULAR | Status: DC | PRN

## 2015-12-24 MED ORDER — DIPHENHYDRAMINE HCL 25 MG PO CAPS: 25.0000 mg | ORAL_CAPSULE | Freq: Four times a day (QID) | ORAL | Status: DC | PRN

## 2015-12-24 MED ORDER — ONDANSETRON HCL 4 MG PO TABS: 4.0000 mg | ORAL_TABLET | ORAL | Status: DC | PRN

## 2015-12-24 MED ORDER — DIBUCAINE 1 % RE OINT: 1.0000 "application " | TOPICAL_OINTMENT | RECTAL | Status: DC | PRN

## 2015-12-24 MED ORDER — TETANUS-DIPHTH-ACELL PERTUSSIS 5-2.5-18.5 LF-MCG/0.5 IM SUSP: 0.5000 mL | Freq: Once | INTRAMUSCULAR | Status: DC

## 2015-12-24 MED ORDER — OXYCODONE HCL 5 MG PO TABS: 5.0000 mg | ORAL_TABLET | ORAL | Status: DC | PRN

## 2015-12-24 MED ORDER — WITCH HAZEL-GLYCERIN EX PADS: 1.0000 "application " | MEDICATED_PAD | CUTANEOUS | Status: DC | PRN

## 2015-12-24 NOTE — Progress Notes (Signed)
Post Partum Day #1 Subjective: no complaints, up ad lib and tolerating PO ; breast and bottlefeeding; condoms for contraception  Objective: Blood pressure (!) 118/44, pulse 73, temperature 98.7 F (37.1 C), temperature source Oral, resp. rate 18, height 5\' 7"  (1.702 m), weight 125.6 kg (277 lb), last menstrual period 03/20/2015, SpO2 100 %, unknown if currently breastfeeding.  Physical Exam:  General: alert and cooperative Lochia: appropriate Uterine Fundus: firm DVT Evaluation: No evidence of DVT seen on physical exam.   Recent Labs  12/23/15 1148  HGB 13.5  HCT 40.0    Assessment/Plan: Plan for discharge tomorrow   LOS: 1 day   Cam HaiSHAW, Agam Davenport CNM 12/24/2015, 9:32 AM

## 2015-12-24 NOTE — Anesthesia Postprocedure Evaluation (Signed)
Anesthesia Post Note  Patient: Tina Keller  Procedure(s) Performed: * No procedures listed *  Patient location during evaluation: Mother Baby Anesthesia Type: Epidural Level of consciousness: awake and alert Pain management: satisfactory to patient Vital Signs Assessment: post-procedure vital signs reviewed and stable Respiratory status: respiratory function stable Cardiovascular status: stable Postop Assessment: no headache, no backache, epidural receding, patient able to bend at knees, no signs of nausea or vomiting and adequate PO intake Anesthetic complications: no     Last Vitals:  Vitals:   12/24/15 0046 12/24/15 0425  BP: 137/72 (!) 118/44  Pulse: 69 73  Resp: 18 18  Temp: 37.2 C 37.1 C    Last Pain:  Vitals:   12/24/15 0530  TempSrc:   PainSc: 0-No pain   Pain Goal:                 Lebaron Bautch

## 2015-12-24 NOTE — Lactation Note (Signed)
This note was copied from a baby's chart. Lactation Consultation Note  Patient Name: Tina Keller ZHYQM'VToday's Date: 12/24/2015 Reason for consult: Initial assessment   Initial consult at 19 hrs old;  GA 39.5; BW 7 lbs, 1 oz.  Vaginal delivery.  Mom is a P1. Infant has breastfed x8 (15-20 min) + attempt x1 (0 min) + formula x2 (3-10 ml) since birth; voids-3; stools-2 since birth.  LS-8 by RN. Reports BFW but had a few questions about positioning.  Infant was beginning to show early feeding cues.  Feeding cues reviewed. Reviewed HE with return demonstration and observation of colostrum on nipple tip. Mom has large, soft breast; large, erect, soft nipples.  Infant is able to open wide to get all of nipple and areola tissue in mouth. Taught mom cross-cradle on left breast.  Infant easily latched and took a few sucks; swallows heard and then went to sleep.  LS-8.  Mom took him off and put him STS with her. Reviewed size of infant's stomach, cluster feeding and feeding cues.  Encouraged to continue breastfeeding 8-12 times or more per day.   Discussed that formula is not needed at this time for medical reasons since baby is feeding so well at breast, voiding and stooling, and latching well.  Encouraged to exclusively breastfeed. Discussed supply and demand and how formula can affect milk supply.   Lactation brochure given and informed of hospital support group and outpatient services.  Encouraged to call for assistance if needed.    Maternal Data Formula Feeding for Exclusion: No Has patient been taught Hand Expression?: Yes Does the patient have breastfeeding experience prior to this delivery?: No  Feeding Feeding Type: Breast Fed Length of feed: 3 min  LATCH Score/Interventions Latch: Grasps breast easily, tongue down, lips flanged, rhythmical sucking. Intervention(s): Assist with latch  Audible Swallowing: A few with stimulation Intervention(s): Hand expression  Type of Nipple:  Everted at rest and after stimulation  Comfort (Breast/Nipple): Soft / non-tender     Hold (Positioning): Assistance needed to correctly position infant at breast and maintain latch. Intervention(s): Breastfeeding basics reviewed;Support Pillows;Position options;Skin to skin  LATCH Score: 8  Lactation Tools Discussed/Used WIC Program: Yes   Consult Status Consult Status: Follow-up Date: 12/25/15 Follow-up type: In-patient    Tina Keller, Tina Keller 12/24/2015, 4:45 PM

## 2015-12-25 MED ORDER — MEASLES, MUMPS & RUBELLA VAC ~~LOC~~ INJ
0.5000 mL | INJECTION | Freq: Once | SUBCUTANEOUS | Status: DC
  Filled 2015-12-25: qty 0.5

## 2015-12-25 MED ORDER — IBUPROFEN 600 MG PO TABS: 600.0000 mg | ORAL_TABLET | Freq: Four times a day (QID) | ORAL | 0 refills | Status: DC | PRN

## 2015-12-25 MED ORDER — SENNOSIDES-DOCUSATE SODIUM 8.6-50 MG PO TABS: 2.0000 | ORAL_TABLET | Freq: Every evening | ORAL | 0 refills | Status: DC | PRN

## 2015-12-25 NOTE — Discharge Instructions (Signed)

## 2015-12-25 NOTE — Lactation Note (Signed)
This note was copied from a baby's chart. Lactation Consultation Note  Patient Name: Tina Keller ZOXWR'UToday's Date: 12/25/2015  Follow up visit made prior to discharge.  Mom states baby is cluster feeding.  Baby is currently breastfeeding baby using football hold.  Baby latched well.  Observed active feeding and good swallows.  Mom praised.  Discharge teaching done including engorgement treatment.  Lactation outpatient services and support information reviewed and encouraged.  Denies questions at present.   Maternal Data    Feeding Feeding Type: Breast Fed Length of feed: 10 min  LATCH Score/Interventions Latch: Grasps breast easily, tongue down, lips flanged, rhythmical sucking.  Audible Swallowing: A few with stimulation  Type of Nipple: Everted at rest and after stimulation  Comfort (Breast/Nipple): Soft / non-tender     Hold (Positioning): No assistance needed to correctly position infant at breast.  LATCH Score: 9  Lactation Tools Discussed/Used     Consult Status      Huston FoleyMOULDEN, Dreyden Rohrman S 12/25/2015, 8:59 AM

## 2015-12-25 NOTE — Discharge Summary (Signed)
OB Discharge Summary  Patient Name: Tina Keller DOB: May 07, 1995 MRN: [id number]  Date of admission: 12/23/2015 Delivering MD: Serita Grammes D   Date of discharge: 12/25/2015  Admitting diagnosis: 39WKS,LABOR Intrauterine pregnancy: [redacted]w[redacted]d    Secondary diagnosis:Active Problems:   Amniotic fluid leaking   NSVD (normal spontaneous vaginal delivery)  Additional problems: GBS positive (adequately treated), rubella non-immune status (MMR vaccine ordered prior to d/c)    Discharge diagnosis: s/p NSVD                                                                 Post partum procedures: None  Augmentation: None  Complications: None  Hospital course:  Onset of Labor With Vaginal Delivery     20y.o. yo G1P1001 at 324w5das admitted in Latent Labor on 12/23/2015. Patient had an uncomplicated labor course as follows:  Membrane Rupture Time/Date: 9:45 AM ,12/23/2015   Intrapartum Procedures: Episiotomy: None [1]                                         Lacerations:  None [1]  Patient had a delivery of a Viable infant. 12/23/2015  Information for the patient's newborn:  McAide, Wojnar0[[id number]]Delivery Method: Vaginal, Spontaneous Delivery (Filed from Delivery Summary)    Patient had an uncomplicated postpartum course.  She is ambulating, tolerating a regular diet, passing flatus, and urinating well. Patient is discharged home in stable condition on 12/25/15.    Physical exam  Vitals:   12/24/15 0046 12/24/15 0425 12/24/15 1831 12/25/15 0553  BP: 137/72 (!) 118/44 113/67 (!) 111/57  Pulse: 69 73 75 (!) 57  Resp: '18 18 18 18  '$ Temp: 98.9 F (37.2 C) 98.7 F (37.1 C) 97.8 F (36.6 C) 98 F (36.7 C)  TempSrc: Oral Oral Oral Other (Comment)  SpO2:   100%   Weight:      Height:       General: alert, cooperative and no distress Lochia: appropriate Uterine Fundus: firm Incision: N/A DVT Evaluation: No evidence of DVT seen on physical exam. Negative Homan's  sign. No cords or calf tenderness. No significant calf/ankle edema. Labs: Lab Results  Component Value Date   WBC 9.3 12/23/2015   HGB 13.5 12/23/2015   HCT 40.0 12/23/2015   MCV 83.9 12/23/2015   PLT 185 12/23/2015   CMP Latest Ref Rng & Units 08/04/2015  Glucose 65 - 99 mg/dL 85  BUN 7 - 20 mg/dL 10  Creatinine 0.50 - 1.00 mg/dL 0.56  Sodium 135 - 146 mmol/L 138  Potassium 3.8 - 5.1 mmol/L 3.8  Chloride 98 - 110 mmol/L 107  CO2 20 - 31 mmol/L 20  Calcium 8.9 - 10.4 mg/dL 9.1  Total Protein 6.3 - 8.2 g/dL 6.1(L)  Total Bilirubin 0.2 - 1.1 mg/dL 0.3  Alkaline Phos 47 - 176 U/L 37(L)  AST 12 - 32 U/L 14  ALT 5 - 32 U/L 17    Discharge instruction: per After Visit Summary and "Baby and Me Booklet".  After Visit Meds:    Medication List    TAKE these medications   CALCIUM + D PO  Take 1 tablet by mouth daily.   ibuprofen 600 MG tablet Commonly known as:  ADVIL,MOTRIN Take 1 tablet (600 mg total) by mouth every 6 (six) hours as needed for mild pain, moderate pain or cramping.   prenatal multivitamin Tabs tablet Take 1 tablet by mouth daily at 12 noon.   senna-docusate 8.6-50 MG tablet Commonly known as:  Senokot-S Take 2 tablets by mouth at bedtime as needed for mild constipation.       Diet: routine diet  Activity: Advance as tolerated. Pelvic rest for 6 weeks.   Outpatient follow up:6 weeks Follow up Appt: Follow up visit: No Follow-up on file.  Postpartum contraception: Condoms  Newborn Data: Live born female  Birth Weight: 7 lb 1.2 oz (3210 g) APGAR: 9, 9  Baby Feeding: Bottle and Breast Disposition:home with mother   12/25/2015 Darci Needle, MD  [place], PGY-2  OB FELLOW DISCHARGE ATTESTATION  I have seen and examined this patient and agree with above documentation in the resident's note.   Jacquiline Doe, MD 7:48 AM

## 2015-12-26 ENCOUNTER — Encounter: Payer: BLUE CROSS/BLUE SHIELD | Admitting: Internal Medicine

## 2016-02-08 ENCOUNTER — Encounter: Payer: Self-pay | Admitting: Family Medicine

## 2016-02-08 NOTE — Progress Notes (Signed)
Electric breast pump request form completed for patient and placed at the front office for faxing.

## 2016-02-13 ENCOUNTER — Encounter: Payer: Self-pay | Admitting: Family Medicine

## 2016-02-14 ENCOUNTER — Ambulatory Visit: Payer: BLUE CROSS/BLUE SHIELD | Admitting: Family Medicine

## 2016-03-06 ENCOUNTER — Ambulatory Visit: Payer: BLUE CROSS/BLUE SHIELD | Admitting: Family Medicine

## 2016-10-30 ENCOUNTER — Ambulatory Visit: Payer: BLUE CROSS/BLUE SHIELD | Admitting: Family Medicine

## 2016-10-31 ENCOUNTER — Ambulatory Visit: Payer: BLUE CROSS/BLUE SHIELD | Admitting: Family Medicine

## 2016-11-23 ENCOUNTER — Ambulatory Visit: Payer: BLUE CROSS/BLUE SHIELD | Admitting: Family Medicine

## 2017-01-11 ENCOUNTER — Ambulatory Visit: Payer: BLUE CROSS/BLUE SHIELD | Admitting: Family Medicine

## 2017-09-12 ENCOUNTER — Other Ambulatory Visit: Payer: Self-pay

## 2017-09-12 ENCOUNTER — Emergency Department (HOSPITAL_COMMUNITY): Payer: BLUE CROSS/BLUE SHIELD

## 2017-09-12 ENCOUNTER — Emergency Department (HOSPITAL_COMMUNITY)
Admission: EM | Admit: 2017-09-12 | Discharge: 2017-09-13 | Disposition: A | Discharge status: Home / Self Care | Payer: BLUE CROSS/BLUE SHIELD | Attending: Emergency Medicine | Admitting: Emergency Medicine

## 2017-09-12 ENCOUNTER — Encounter (HOSPITAL_COMMUNITY): Payer: Self-pay | Admitting: Emergency Medicine

## 2017-09-12 DIAGNOSIS — S91112A Laceration without foreign body of left great toe without damage to nail, initial encounter: Secondary | ICD-10-CM | POA: Diagnosis not present

## 2017-09-12 DIAGNOSIS — W109XXA Fall (on) (from) unspecified stairs and steps, initial encounter: Secondary | ICD-10-CM | POA: Insufficient documentation

## 2017-09-12 DIAGNOSIS — Z87891 Personal history of nicotine dependence: Secondary | ICD-10-CM | POA: Insufficient documentation

## 2017-09-12 DIAGNOSIS — Y929 Unspecified place or not applicable: Secondary | ICD-10-CM | POA: Insufficient documentation

## 2017-09-12 DIAGNOSIS — W108XXA Fall (on) (from) other stairs and steps, initial encounter: Secondary | ICD-10-CM

## 2017-09-12 DIAGNOSIS — S99922A Unspecified injury of left foot, initial encounter: Secondary | ICD-10-CM | POA: Diagnosis not present

## 2017-09-12 DIAGNOSIS — S8992XA Unspecified injury of left lower leg, initial encounter: Secondary | ICD-10-CM | POA: Diagnosis not present

## 2017-09-12 DIAGNOSIS — Y999 Unspecified external cause status: Secondary | ICD-10-CM | POA: Diagnosis not present

## 2017-09-12 DIAGNOSIS — M25562 Pain in left knee: Secondary | ICD-10-CM

## 2017-09-12 DIAGNOSIS — Y939 Activity, unspecified: Secondary | ICD-10-CM | POA: Insufficient documentation

## 2017-09-12 DIAGNOSIS — M79605 Pain in left leg: Secondary | ICD-10-CM | POA: Diagnosis not present

## 2017-09-12 DIAGNOSIS — S99912A Unspecified injury of left ankle, initial encounter: Secondary | ICD-10-CM | POA: Diagnosis not present

## 2017-09-12 LAB — I-STAT BETA HCG BLOOD, ED (MC, WL, AP ONLY)

## 2017-09-12 NOTE — ED Notes (Signed)
Results reviewed.  No changes in acuity at this time 

## 2017-09-12 NOTE — ED Triage Notes (Signed)
Pt states she tripped on a child's toy while cleaning up and fell down approximately 14 steps on her abdomen. Can't bear weight on left leg. Blood noted to left great toe.  Pt has abdominal pain. No LOC. Does not recall hitting head.

## 2017-09-12 NOTE — ED Provider Notes (Signed)
Patient placed in Quick Look pathway, seen and evaluated   Chief Complaint: Fall  HPI:   Patient presents for evaluation following a fall.  States she tripped on a toy and slid down 14 steps in a prone position.  Complains mostly of left leg pain centered around the left knee and the left foot.  She complains of some abdominal soreness.  Denies LOC, head injury, neuro deficits, neck/back pain.  ROS: Left leg pain (one)  Physical Exam:   Gen: No distress  Neuro: Awake and Alert  Skin: Warm    Focused Exam:   No diaphoresis.  No pallor.  Pulmonary: No increased work of breathing.  Speaks in full sentences without difficulty.  Lung sounds clear.  No tachypnea.  Cardiac: Not tachycardic.  Peripheral pulses intact.  Abdominal: Some minor generalized abdominal tenderness.  No noted bruising or erythema.  Neurologic: Motor function intact in all 4 extremities.  Sensation grossly intact to light touch in the lower extremities.  MSK: Tenderness to the left knee and left foot with wound noted in the area of toenail of the left great toe.   Initiation of care has begun. The patient has been counseled on the process, plan, and necessity for staying for the completion/evaluation, and the remainder of the medical screening examination   Concepcion LivingJoy, Shawn C, PA-C 09/12/17 2104    Dione BoozeGlick, David, MD 09/13/17 [phone]

## 2017-09-13 MED ORDER — TRAMADOL HCL 50 MG PO TABS: 50.0000 mg | ORAL_TABLET | Freq: Four times a day (QID) | ORAL | 0 refills | Status: DC | PRN

## 2017-09-13 MED ORDER — OXYCODONE-ACETAMINOPHEN 5-325 MG PO TABS
1.0000 | ORAL_TABLET | Freq: Once | ORAL | Status: AC
  Administered 2017-09-13: 1 via ORAL
  Filled 2017-09-13: qty 1

## 2017-09-13 NOTE — Discharge Instructions (Signed)
Use crutches as needed.   Apply ice 3-4 times a day, for 30 minutes at a time.  Take ibuprofen, naproxen, or acetaminophen as needed for pain, reserve tramadol for severe pain.

## 2017-09-13 NOTE — ED Provider Notes (Signed)
[facility]   CSN: [id number] Arrival date & time: 09/12/17  2031     History   Chief Complaint Chief Complaint  Patient presents with  . Fall    HPI Tina Keller is a 22 y.o. female.  The history is provided by the patient.  She tripped over her her son's toy and fell down steps injuring her left knee and foot.  She denies head injury and denies loss of consciousness.  Pain was initially rated at 6/10, but has gotten worse and she now rates it at 9/10.  Past Medical History:  Diagnosis Date  . Encounter for supervision of normal first pregnancy in second trimester 09/02/2015    [facility] Prenatal Labs Dating  LMP Blood type: O/POS/-- (03/31 1427)  Genetic Screen 1 Screen:    AFP:     Quad: Normal    NIPS: Antibody:NEG (03/31 1427) Anatomic US  Repeat U/S normal  Rubella: <0.90 (03/31 1427) GTT Early: 85             Third trimester:  RPR: NON REAC (08/25 1336)  Flu vaccine  Declined  HBsAg: NEGATIVE (03/31 1427)  TDaP vaccine  Given                          Rhogam: N/A HIV: NONREACTIVE (08/25 1336)  Baby Food  Breast and Bottle                                     GBS: (For PCN allergy, check sensitivities)  Positive  Contraception  Undecided  P[facility]: [facility]umc[facility]  [facility]   [facility]ian   Support Person      . Medical history non-contributory   . NSVD (normal spontaneous vaginal delivery) 12/25/2015    Patient Active Problem List   Diagnosis Date Noted  . Positive GBS test 12/09/2015  . Rubella non-immune status, antepartum 12/01/2015  . Dermatitis 12/18/2013  . Skin change 02/01/2012  . CHILDHOOD OBESITY 11/26/2006    Past Surgical History:  Procedure Laterality Date  . NO PAST SURGERIES       OB History    Gravida  1   Para  1   Term  1   Preterm      AB      Living  1     SAB      TAB      Ectopic      Multiple  0   Live Births  1            Home Medications    Prior to  Admission medications   Medication Sig Start Date End Date Taking? Authorizing Provider  Calcium Citrate-Vitamin D (CALCIUM + D PO) Take 1 tablet by mouth daily.    [provider]  ibuprofen (ADVIL,MOTRIN) 600 MG tablet Take 1 tablet (600 mg total) by mouth every 6 (six) hours as needed for mild pain, moderate pain or cramping. 12/25/15   Casey BurkittFitzgerald, Hillary Moen, MD  Prenatal Vit-Fe Fumarate-FA (PRENATAL MULTIVITAMIN) TABS tablet Take 1 tablet by mouth daily at 12 noon.    [provider]  senna-docusate (SENOKOT-S) 8.6-50 MG tablet Take 2 tablets by mouth at bedtime as needed for mild constipation. 12/25/15   Casey BurkittFitzgerald, Hillary Moen, MD  traMADol (ULTRAM) 50 MG tablet Take 1 tablet (50 mg total) by mouth  every 6 (six) hours as needed. 09/13/17   Dione Booze, MD    Family History No family history on file.  Social History Social History   Tobacco Use  . Smoking status: Former Smoker    Types: Cigarettes  . Smokeless tobacco: Never Used  Substance Use Topics  . Alcohol use: Yes    Comment: social not since pregnacy  . Drug use: No     Allergies   Patient has no known allergies.   Review of Systems Review of Systems  All other systems reviewed and are negative.    Physical Exam Updated Vital Signs BP 118/72   Pulse 75   Temp 98.8 F (37.1 C) (Oral)   Resp 17   Ht 5\' 8"  (1.727 m)   Wt 113.4 kg   SpO2 100%   BMI 38.01 kg/m   Physical Exam  Nursing note and vitals reviewed.  22 year old female, resting comfortably and in no acute distress. Vital signs are normal. Oxygen saturation is 100%, which is normal. Head is normocephalic and atraumatic. PERRLA, EOMI. Oropharynx is clear. Neck is nontender and supple without adenopathy or JVD. Back is nontender and there is no CVA tenderness. Lungs are clear without rales, wheezes, or rhonchi. Chest is nontender. Heart has regular rate and rhythm without murmur. Abdomen is soft, flat, nontender without  masses or hepatosplenomegaly and peristalsis is normoactive. Extremities: No swelling or deformity.  There is mild tenderness to palpation over the left shoulder, but full passive range of motion is present.  Left knee is very tender to palpation-especially medially.  No effusion present, no instability.  She will not allow Lachman or McMurray's test to be performed.  No swelling or deformity of the left ankle.  There is a small laceration at the base of the left first toenail.  This is too small to suture.  The toenail is solidly attached.  There is no swelling or deformity of the toe. Skin is warm and dry without rash. Neurologic: Mental status is normal, cranial nerves are intact, there are no motor or sensory deficits.  ED Treatments / Results  Labs (all labs ordered are listed, but only abnormal results are displayed) Labs Reviewed  I-STAT BETA HCG BLOOD, ED ([facility], [facility], [facility] ONLY)   Radiology Dg Ankle Complete Left  Result Date: 09/12/2017 CLINICAL DATA:  Larey Seat down stairs. Leg pain EXAM: LEFT ANKLE COMPLETE - 3+ VIEW COMPARISON:  None. FINDINGS: There is no evidence of fracture, dislocation, or joint effusion. There is no evidence of arthropathy or other focal bone abnormality. Soft tissues are unremarkable. IMPRESSION: Negative. Electronically Signed   By: Norva Pavlov M.D.   On: 09/12/2017 21:51   Dg Knee Complete 4 Views Left  Result Date: 09/12/2017 CLINICAL DATA:  Larey Seat down stairs. Leg pain EXAM: LEFT KNEE - COMPLETE 4+ VIEW COMPARISON:  None. FINDINGS: No evidence of fracture, dislocation, or joint effusion. No evidence of arthropathy or other focal bone abnormality. Soft tissues are unremarkable. IMPRESSION: Negative. Electronically Signed   By: Norva Pavlov M.D.   On: 09/12/2017 21:53   Dg Foot Complete Left  Result Date: 09/12/2017 CLINICAL DATA:  Larey Seat down stairs. Leg pain EXAM: LEFT FOOT - COMPLETE 3+ VIEW COMPARISON:  Ankle films today FINDINGS: There is no evidence of fracture  or dislocation. There is no evidence of arthropathy or other focal bone abnormality. Soft tissues are unremarkable. IMPRESSION: Negative. Electronically Signed   By: Norva Pavlov M.D.   On: 09/12/2017 21:52  Procedures Procedures   Medications Ordered in ED Medications  oxyCODONE-acetaminophen (PERCOCET/ROXICET) 5-325 MG per tablet 1 tablet (has no administration in time range)     Initial Impression / Assessment and Plan / ED Course  I have reviewed the triage vital signs and the nursing notes.  Pertinent labs & imaging results that were available during my care of the patient were reviewed by me and considered in my medical decision making (see chart for details).  Fall down steps with injury to left knee and left first toe.  X-rays are negative for fracture.  Patient is advised of this.  She is advised on ice and elevation.  Given crutches to use as needed.  Recommended that she is over-the-counter NSAIDs as needed for pain, given prescription for tramadol for more severe pain.  Follow-up with orthopedics.  Final Clinical Impressions(s) / ED Diagnoses   Final diagnoses:  Fall down steps, initial encounter  Acute pain of left knee  Laceration of left great toe without foreign body without damage to nail, initial encounter    ED Discharge Orders         Ordered    traMADol (ULTRAM) 50 MG tablet  Every 6 hours PRN     09/13/17 0255           Dione Booze, MD 09/13/17 [phone]

## 2018-03-15 IMAGING — US US OB TRANSVAGINAL
1 series · 15 of 25 positions shown · non-contrast
Comparison: None.

CLINICAL DATA: Possible IUP.  Follow-up scan.

EXAM:
TRANSVAGINAL OB ULTRASOUND
TECHNIQUE: Transvaginal ultrasound was performed for complete evaluation of the
gestation as well as the maternal uterus, adnexal regions, and
pelvic cul-de-sac.

[Series 1: us ob transvaginal · 15 of 25 slices shown]
[im 1/25]
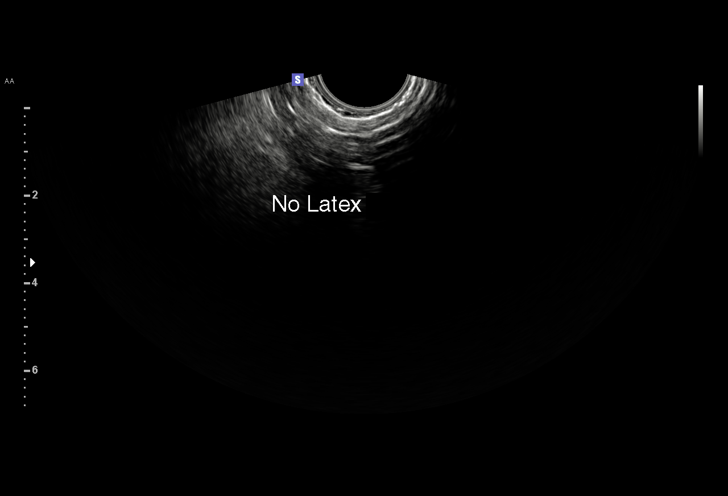
[im 3/25]
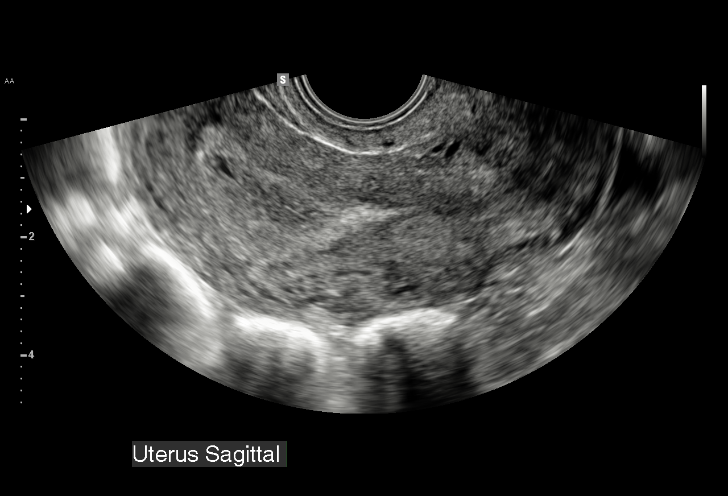
[im 5/25]
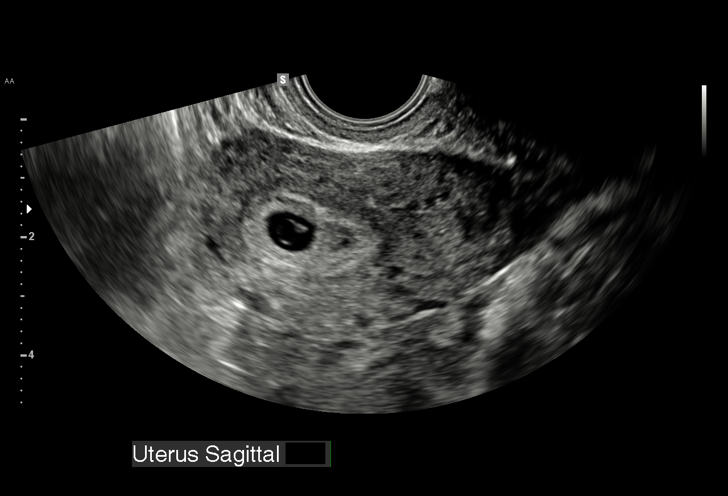
[im 6/25]
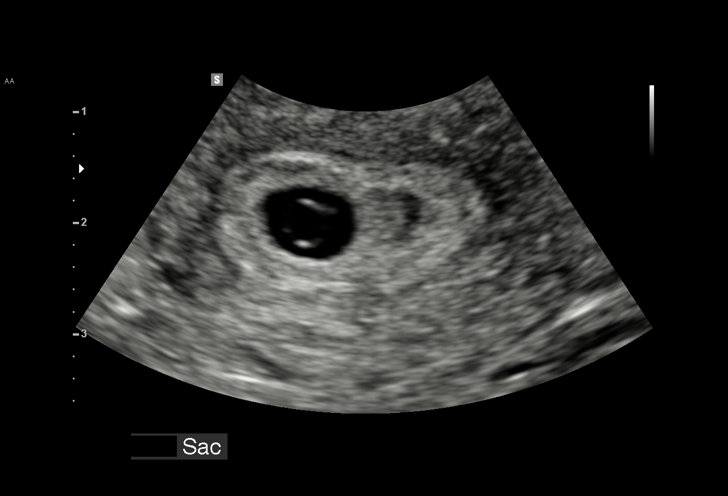
[im 8/25]
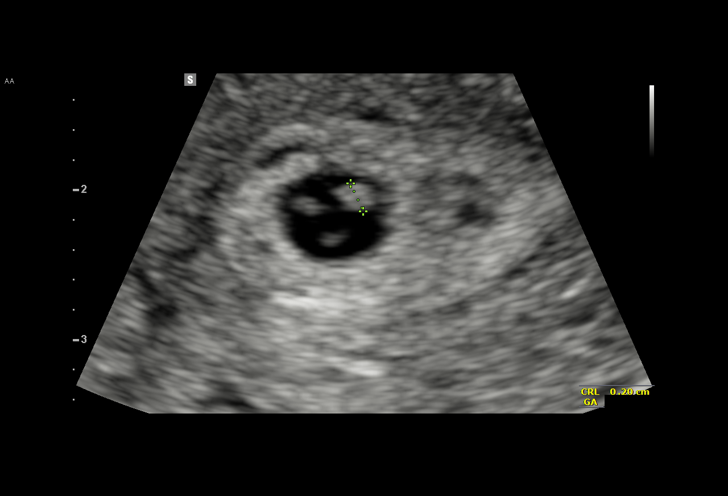
[im 10/25]
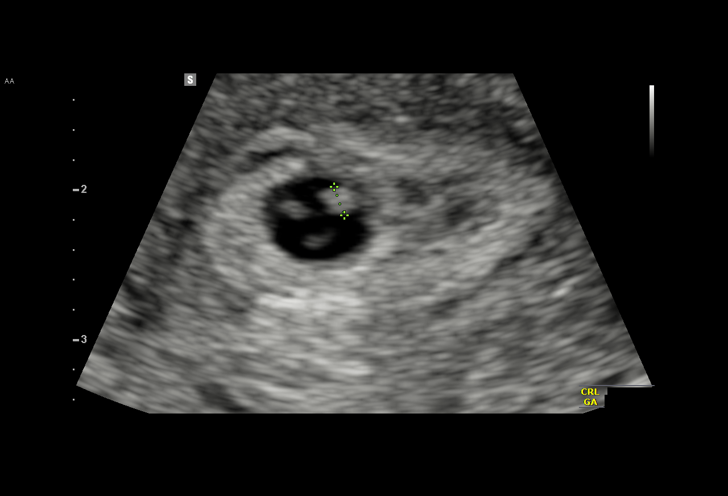
[im 11/25]
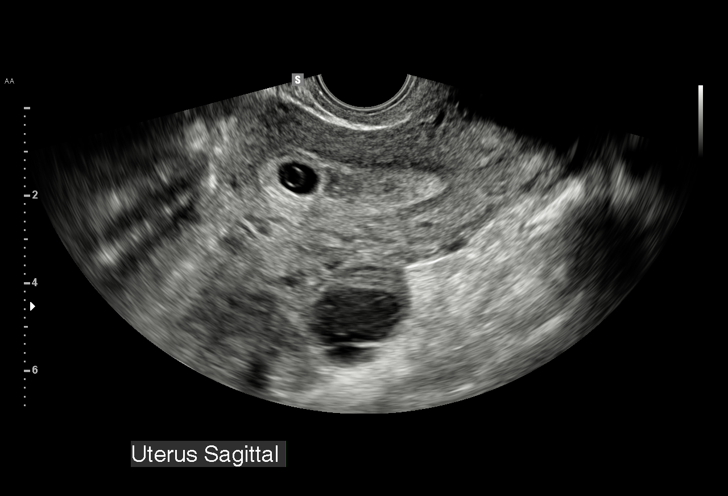
[im 13/25]
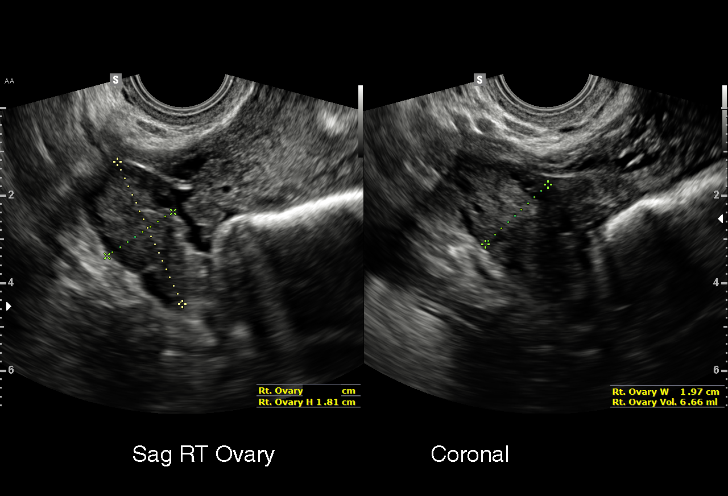
[im 15/25]
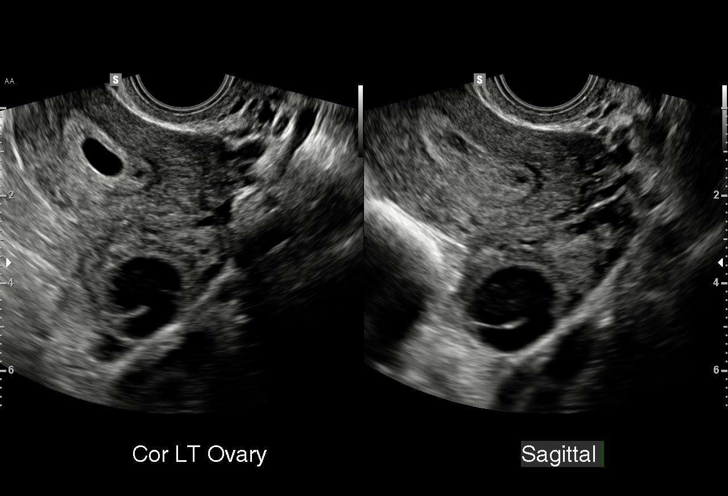
[im 16/25]
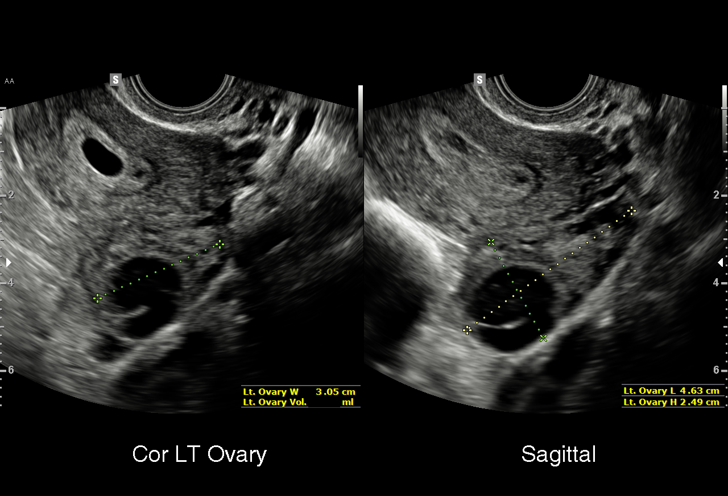
[im 18/25]
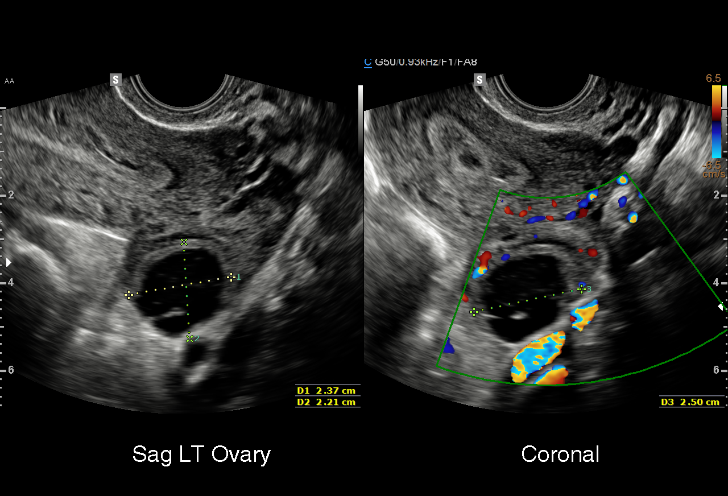
[im 20/25]
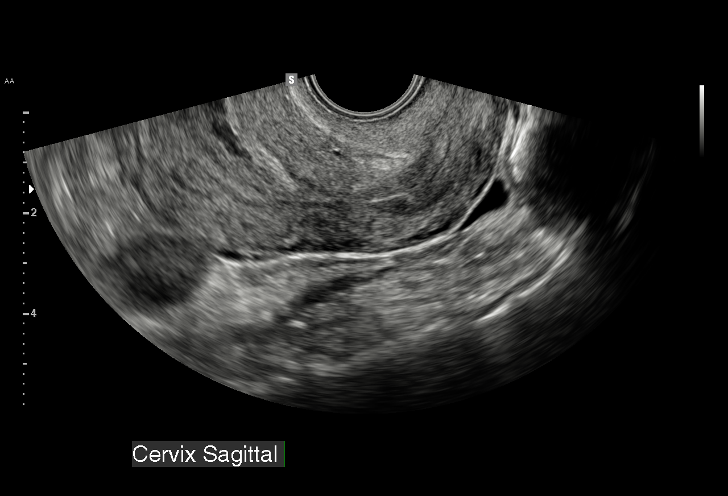
[im 21/25]
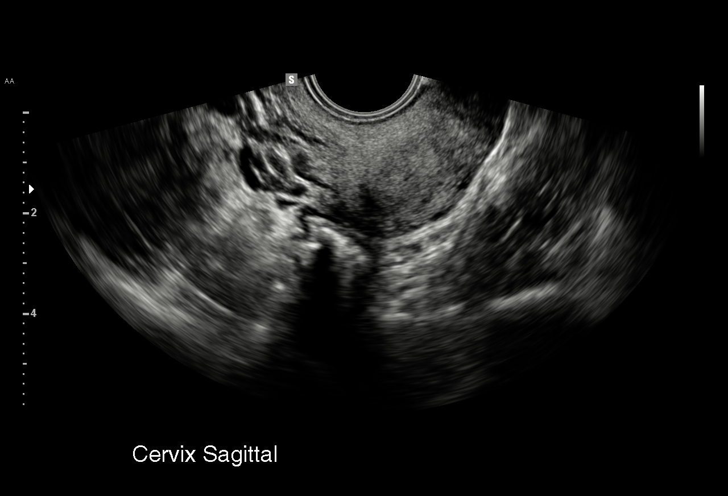
[im 23/25]
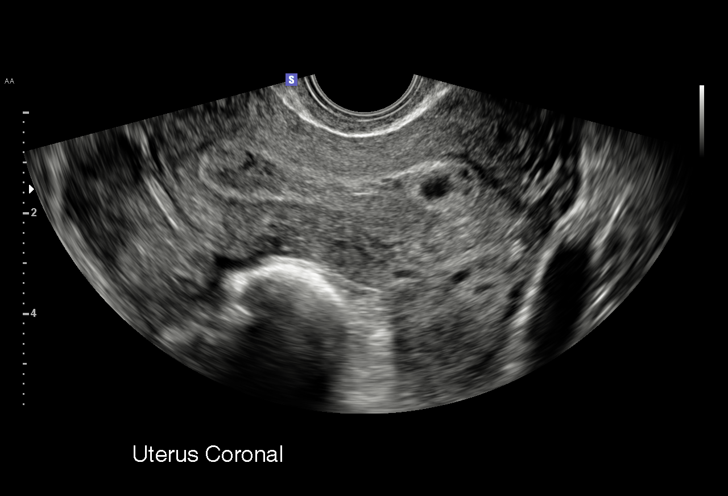
[im 25/25]
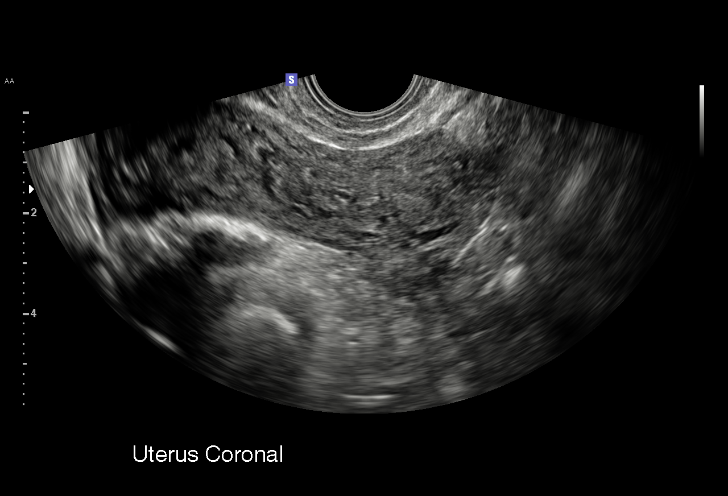

[15 of 25 positions shown; findings below may reference images not displayed]

FINDINGS: Intrauterine gestational sac: Visualized/normal in shape.

Yolk sac:  Visualized

Embryo:  Visualized

Cardiac Activity: Visualized

CRL:   0.2 cm 5 w 5 d                  US EDC: 12/30/2015

Subchorionic hemorrhage:  None visualized.

Maternal uterus/adnexae: 2.4 x 2.2 x 2.5 cm left ovarian corpus
luteal cyst. Trace free pelvic fluid.
IMPRESSION: 1.  Single viable intrauterine pregnancy at 5 weeks 5 days.

2.  Small left ovarian corpus luteal cyst.  Trace free pelvic fluid.

## 2018-04-08 ENCOUNTER — Ambulatory Visit: Payer: BLUE CROSS/BLUE SHIELD

## 2018-08-15 IMAGING — US US MFM OB FOLLOW-UP
1 series · 14 of 28 positions shown · non-contrast
Comparison: none

[Series 1: us mfm ob follow-up · 42 acquisitions, 14 frames shown]
[im 2/42]
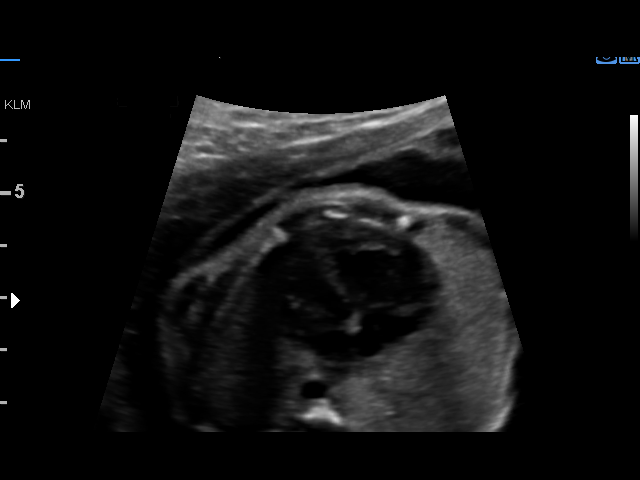
[im 5/42]
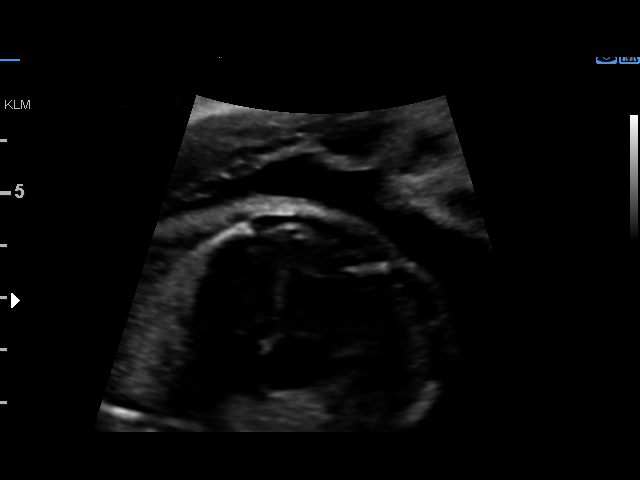
[im 8/42]
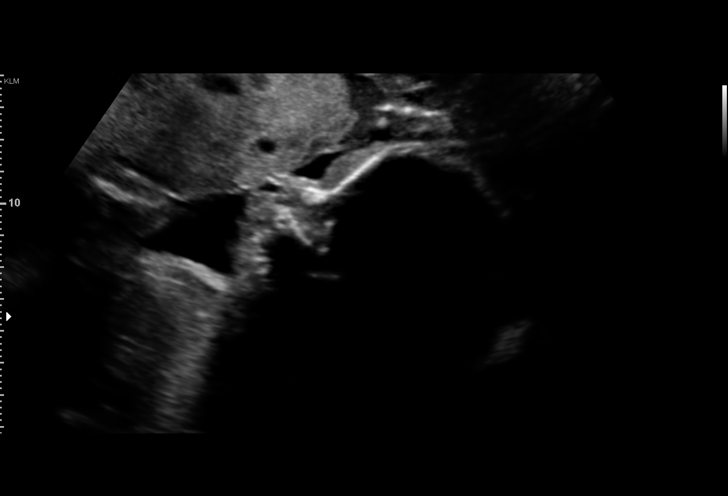
[im 11/42]
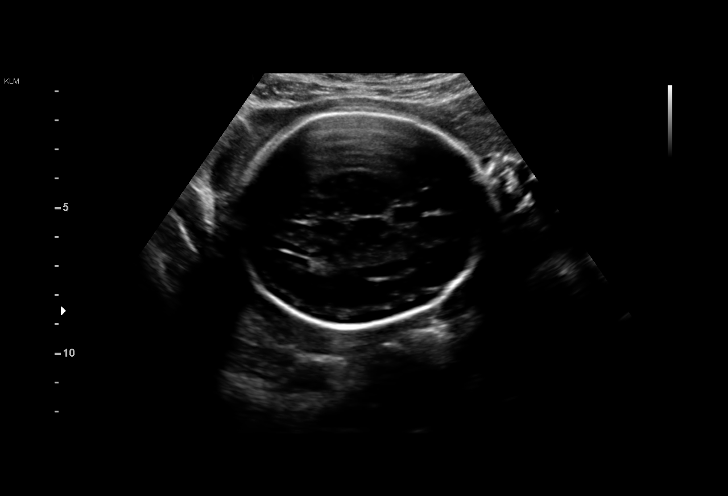
[im 14/42]
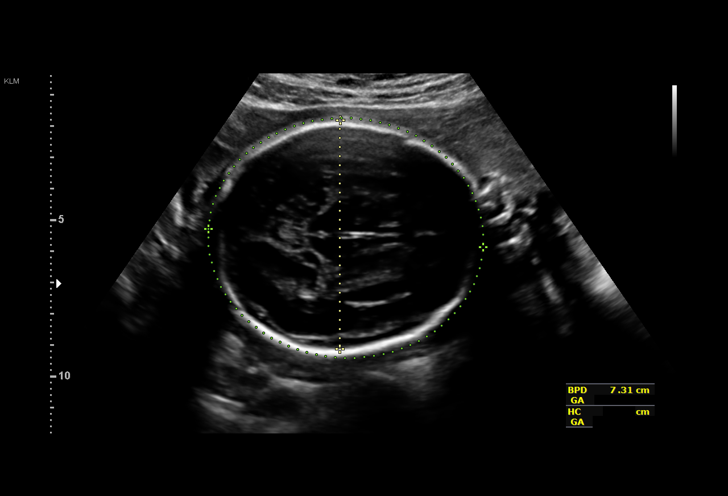
[im 17/42]
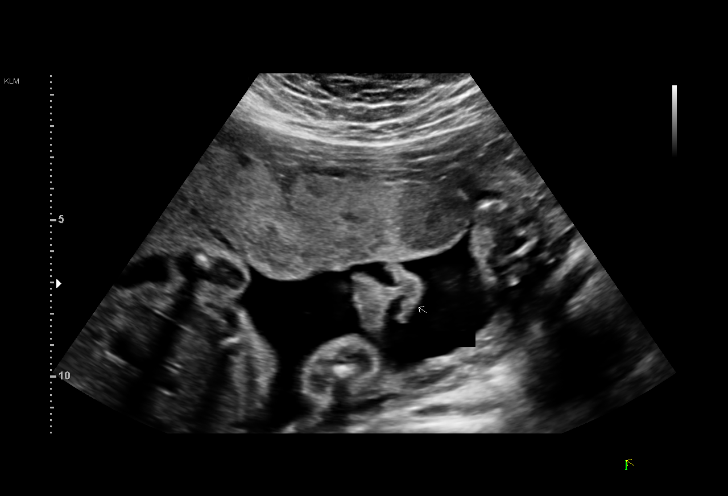
[im 20/42]
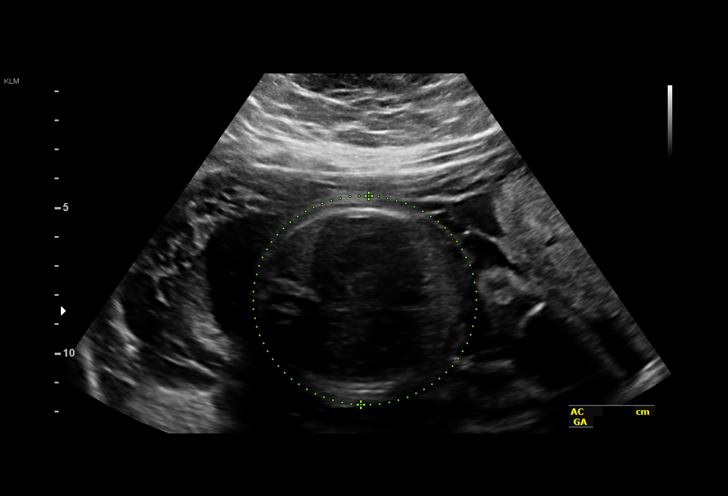
[im 23/42]
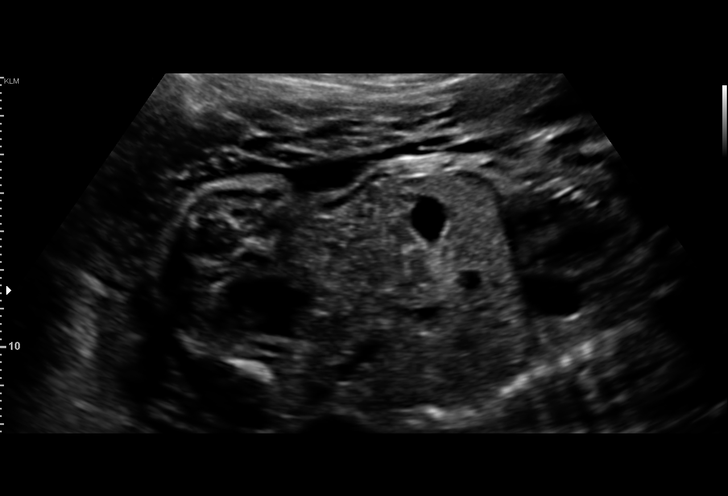
[im 26/42]
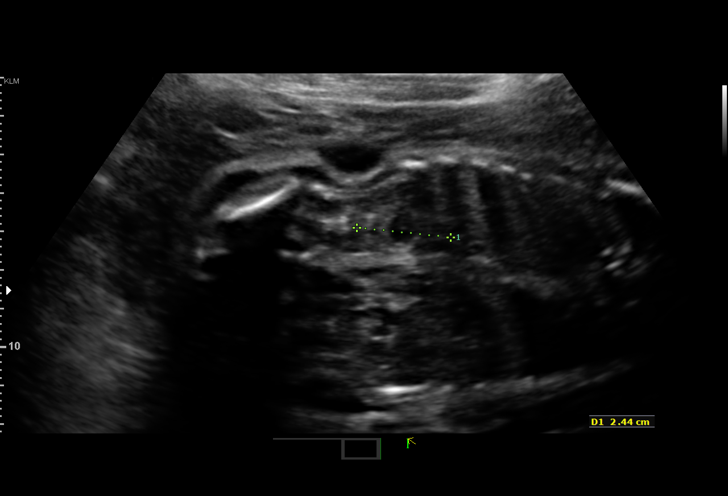
[im 29/42]
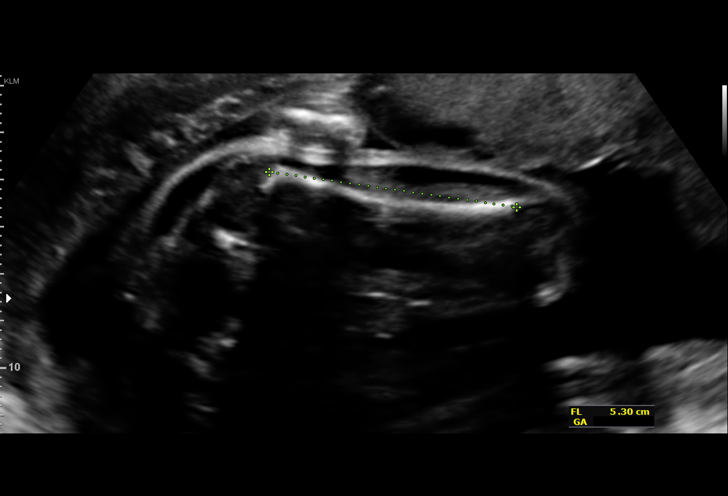
[im 32/42]
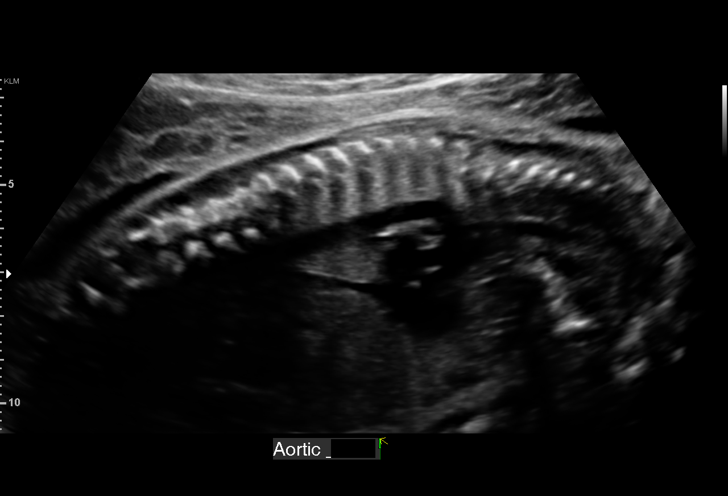
[im 35/42]
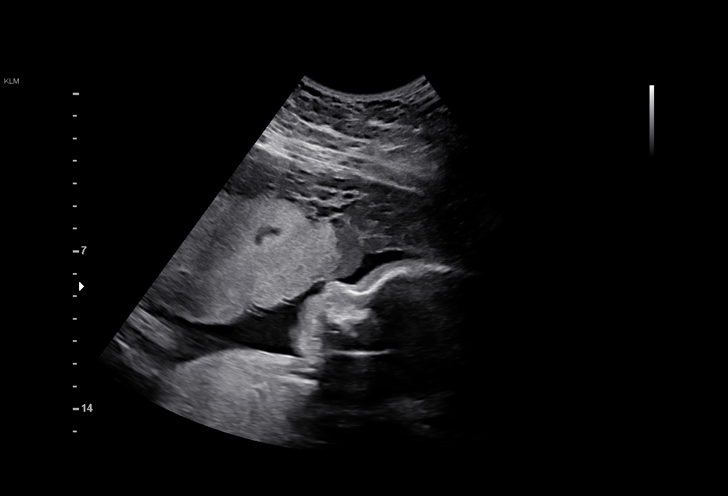
[im 38/42]
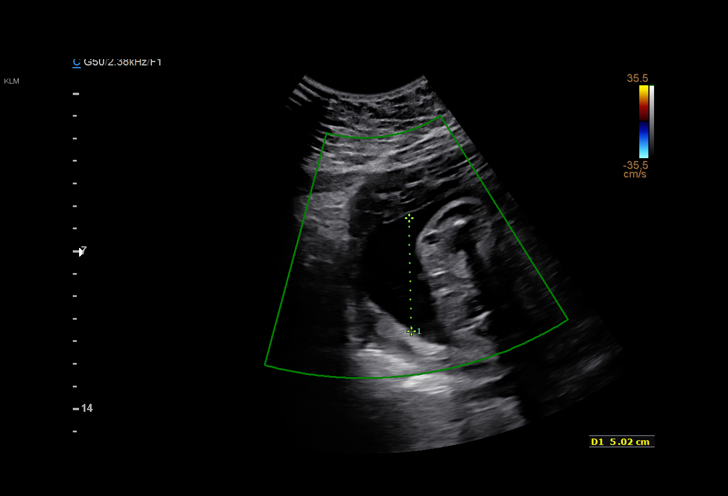
[im 42/42]
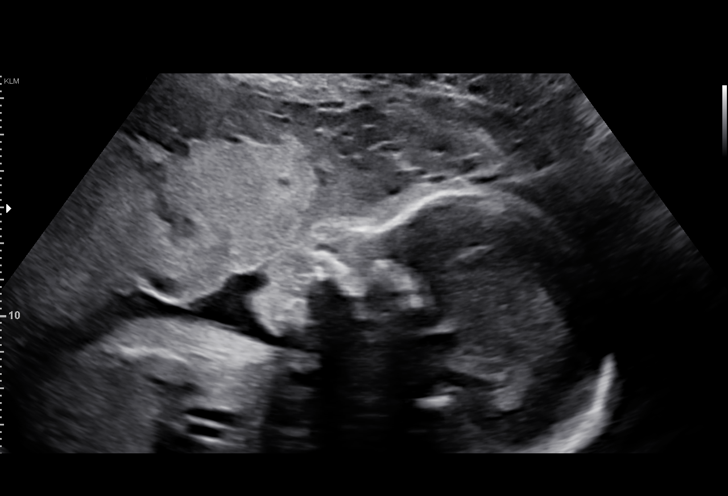

[14 of 28 positions shown; findings below may reference images not displayed]

1  DEEJAY ENRIQUE           [id number]      [id number]     [id number]
Indications

27 weeks gestation of pregnancy
Fetal abnormality - other known or
suspected (EIF)
Obesity complicating pregnancy, second
trimester
Follow-up incomplete fetal anatomic            Z36
evaluation
Fetal Evaluation

Num Of Fetuses:     1
Cardiac Activity:   Observed
Presentation:       Cephalic
Placenta:           Anterior, above cervical os
P. Cord Insertion:  Previously Visualized

Amniotic Fluid
AFI FV:      Subjectively within normal limits

Largest Pocket(cm)
5
Biometry

BPD:      72.8  mm     G. Age:  29w 2d         87  %    CI:        76.55   %   70 - 86
FL/HC:      19.7   %   18.8 -
HC:      263.6  mm     G. Age:  28w 5d         56  %    HC/AC:      1.11       1.05 -
AC:      237.1  mm     G. Age:  28w 0d         56  %    FL/BPD:     71.4   %   71 - 87
FL:         52  mm     G. Age:  27w 5d         40  %    FL/AC:      21.9   %   20 - 24

Est. FW:    6656  gm      2 lb 9 oz     62  %
Gestational Age
LMP:           28w 2d       Date:   03/20/15                 EDD:   12/25/15
Clinical EDD:  27w 4d                                        EDD:   12/30/15
U/S Today:     28w 3d                                        EDD:   12/24/15
Best:          27w 4d    Det. By:   Clinical EDD             EDD:   12/30/15
Anatomy

Cranium:               Appears normal         Aortic Arch:            Appears normal
Cavum:                 Appears normal         Ductal Arch:            Appears normal
Ventricles:            Appears normal         Diaphragm:              Appears normal
Choroid Plexus:        Appears normal         Stomach:                Appears normal, left
sided
Cerebellum:            Appears normal         Abdomen:                Appears normal
Posterior Fossa:       Appears normal         Abdominal Wall:         Previously seen
Nuchal Fold:           Previously seen        Cord Vessels:           Previously seen
Face:                  Orbits and profile     Kidneys:                Appear normal
previously seen
Lips:                  Previously seen        Bladder:                Appears normal
Thoracic:              Appears normal         Spine:                  Previously seen
Heart:                 Appears normal         Upper Extremities:      Previously seen
(4CH, axis, and
situs)
RVOT:                  Appears normal         Lower Extremities:      Previously seen
LVOT:                  Appears normal

Other:  Nasal bone previously visualized. Heels previously visualized. Male
gender.
Cervix Uterus Adnexa

Cervix
Length:            4.5  cm.
Normal appearance by transabdominal scan.
Impression

SIUP at 27+4 weeks
Normal interval anatomy; anatomic survey complete
Normal amniotic fluid volume
Appropriate interval growth with EFW at the 62nd %tile
Recommendations

Follow-up as clinically indicated

## 2018-08-18 DIAGNOSIS — Z03818 Encounter for observation for suspected exposure to other biological agents ruled out: Secondary | ICD-10-CM | POA: Diagnosis not present

## 2018-09-15 ENCOUNTER — Other Ambulatory Visit: Payer: Self-pay

## 2018-09-15 DIAGNOSIS — Z20822 Contact with and (suspected) exposure to covid-19: Secondary | ICD-10-CM

## 2018-09-16 LAB — NOVEL CORONAVIRUS, NAA: SARS-CoV-2, NAA: NOT DETECTED

## 2018-09-17 ENCOUNTER — Telehealth: Payer: Self-pay | Admitting: Family Medicine

## 2018-09-17 NOTE — Telephone Encounter (Signed)
Negative COVID results given. Patient results "NOT Detected." Caller expressed understanding. ° °

## 2018-10-29 ENCOUNTER — Ambulatory Visit: Payer: BLUE CROSS/BLUE SHIELD

## 2018-11-29 ENCOUNTER — Emergency Department (HOSPITAL_COMMUNITY)
Admission: EM | Admit: 2018-11-29 | Discharge: 2018-11-29 | Disposition: A | Discharge status: Home / Self Care | Payer: BC Managed Care – PPO | Attending: Emergency Medicine | Admitting: Emergency Medicine

## 2018-11-29 ENCOUNTER — Encounter (HOSPITAL_COMMUNITY): Payer: Self-pay | Admitting: Emergency Medicine

## 2018-11-29 ENCOUNTER — Other Ambulatory Visit: Payer: Self-pay

## 2018-11-29 ENCOUNTER — Emergency Department (HOSPITAL_COMMUNITY): Payer: BC Managed Care – PPO

## 2018-11-29 DIAGNOSIS — Z20828 Contact with and (suspected) exposure to other viral communicable diseases: Secondary | ICD-10-CM | POA: Diagnosis not present

## 2018-11-29 DIAGNOSIS — R059 Cough, unspecified: Secondary | ICD-10-CM

## 2018-11-29 DIAGNOSIS — N760 Acute vaginitis: Secondary | ICD-10-CM | POA: Diagnosis not present

## 2018-11-29 DIAGNOSIS — Z79899 Other long term (current) drug therapy: Secondary | ICD-10-CM | POA: Diagnosis not present

## 2018-11-29 DIAGNOSIS — Z87891 Personal history of nicotine dependence: Secondary | ICD-10-CM | POA: Insufficient documentation

## 2018-11-29 DIAGNOSIS — N939 Abnormal uterine and vaginal bleeding, unspecified: Secondary | ICD-10-CM | POA: Diagnosis not present

## 2018-11-29 DIAGNOSIS — R05 Cough: Secondary | ICD-10-CM | POA: Diagnosis not present

## 2018-11-29 DIAGNOSIS — R0609 Other forms of dyspnea: Secondary | ICD-10-CM | POA: Diagnosis not present

## 2018-11-29 DIAGNOSIS — B9689 Other specified bacterial agents as the cause of diseases classified elsewhere: Secondary | ICD-10-CM

## 2018-11-29 DIAGNOSIS — R0600 Dyspnea, unspecified: Secondary | ICD-10-CM

## 2018-11-29 DIAGNOSIS — R1010 Upper abdominal pain, unspecified: Secondary | ICD-10-CM | POA: Diagnosis not present

## 2018-11-29 LAB — WET PREP, GENITAL
Sperm: NONE SEEN
Trich, Wet Prep: NONE SEEN
Yeast Wet Prep HPF POC: NONE SEEN

## 2018-11-29 LAB — COMPREHENSIVE METABOLIC PANEL
ALT: 14 U/L (ref 0–44)
AST: 14 U/L — ABNORMAL LOW (ref 15–41)
Albumin: 3.8 g/dL (ref 3.5–5.0)
Alkaline Phosphatase: 50 U/L (ref 38–126)
Anion gap: 12 (ref 5–15)
BUN: 12 mg/dL (ref 6–20)
CO2: 20 mmol/L — ABNORMAL LOW (ref 22–32)
Calcium: 9.4 mg/dL (ref 8.9–10.3)
Chloride: 106 mmol/L (ref 98–111)
Creatinine, Ser: 0.8 mg/dL (ref 0.44–1.00)
GFR calc Af Amer: >60 mL/min (ref >60)
GFR calc non Af Amer: >60 mL/min (ref >60)
Glucose, Bld: 98 mg/dL (ref 70–99)
Potassium: 3.5 mmol/L (ref 3.5–5.1)
Sodium: 138 mmol/L (ref 135–145)
Total Bilirubin: 0.6 mg/dL (ref 0.3–1.2)
Total Protein: 7.4 g/dL (ref 6.5–8.1)

## 2018-11-29 LAB — CBC
HCT: 39.2 % (ref 36.0–46.0)
Hemoglobin: 12.6 g/dL (ref 12.0–15.0)
MCH: 27.6 pg (ref 26.0–34.0)
MCHC: 32.1 g/dL (ref 30.0–36.0)
MCV: 85.8 fL (ref 80.0–100.0)
Platelets: 221 10*3/uL (ref 150–400)
RBC: 4.57 MIL/uL (ref 3.87–5.11)
RDW: 13.3 % (ref 11.5–15.5)
WBC: 9.8 10*3/uL (ref 4.0–10.5)
nRBC: 0 % (ref 0.0–0.2)

## 2018-11-29 LAB — URINALYSIS, ROUTINE W REFLEX MICROSCOPIC
Bacteria, UA: NONE SEEN
Bilirubin Urine: NEGATIVE
Glucose, UA: NEGATIVE mg/dL
Ketones, ur: 5 mg/dL — AB
Leukocytes,Ua: NEGATIVE
Nitrite: NEGATIVE
Protein, ur: 30 mg/dL — AB
Specific Gravity, Urine: 1.031 — ABNORMAL HIGH (ref 1.005–1.030)
pH: 5 (ref 5.0–8.0)

## 2018-11-29 LAB — RPR
RPR Ser Ql: REACTIVE — AB
RPR Titer: 1:1 {titer}

## 2018-11-29 LAB — I-STAT BETA HCG BLOOD, ED (MC, WL, AP ONLY): I-stat hCG, quantitative: <5 m[IU]/mL (ref <5)

## 2018-11-29 LAB — HIV ANTIBODY (ROUTINE TESTING W REFLEX): HIV Screen 4th Generation wRfx: NONREACTIVE

## 2018-11-29 LAB — LIPASE, BLOOD: Lipase: 27 U/L (ref 11–51)

## 2018-11-29 MED ORDER — ALBUTEROL SULFATE HFA 108 (90 BASE) MCG/ACT IN AERS
1.0000 | INHALATION_SPRAY | Freq: Once | RESPIRATORY_TRACT | Status: DC
  Filled 2018-11-29: qty 6.7

## 2018-11-29 MED ORDER — BENZONATATE 100 MG PO CAPS: 100.0000 mg | ORAL_CAPSULE | Freq: Three times a day (TID) | ORAL | 0 refills | Status: DC | PRN

## 2018-11-29 MED ORDER — SODIUM CHLORIDE 0.9 % IV BOLUS
1000.0000 mL | Freq: Once | INTRAVENOUS | Status: AC
  Administered 2018-11-29: 1000 mL via INTRAVENOUS

## 2018-11-29 MED ORDER — ACETAMINOPHEN 500 MG PO TABS: 500.0000 mg | ORAL_TABLET | Freq: Four times a day (QID) | ORAL | 0 refills | Status: DC | PRN

## 2018-11-29 MED ORDER — SODIUM CHLORIDE 0.9% FLUSH: 3.0000 mL | Freq: Once | INTRAVENOUS | Status: DC

## 2018-11-29 NOTE — ED Notes (Signed)
Pt ambulated in room, sats remained 100% on RA. Pt reports feeling slightly winded afterward.

## 2018-11-29 NOTE — ED Notes (Signed)
Pelvic cart at bedside. 

## 2018-11-29 NOTE — Discharge Instructions (Addendum)
Your wet prep today showed evidence of bacterial vaginosis.  This is not an STD.  It is treated with antibiotics.  Do not drink any alcohol while you are taking metronidazole/Flagyl.   Please take all of your antibiotics until finished!   Take your antibiotics with food.  Common side effects of antibiotics include nausea, vomiting, abdominal discomfort, and diarrhea. You may help offset some of this with probiotics which you can buy or get in yogurt. Do not eat  or take the probiotics until 2 hours after your antibiotic.    Some studies suggest that certain antibiotics can reduce the efficacy of certain oral contraceptive pills (birth control), so please use additional contraceptives (condoms or other barrier method) while you are taking the antibiotics and for an additional 5 to 7 days afterwards if you are a female on these medications.  Take 1 to 2 tablets of Tylenol every 6 hours as needed for pain.  Drink plenty of fluids and get plenty of rest.  Use the albuterol inhaler 1 to 2 puffs every 4-6 hours as needed for shortness of breath.  You can take Tessalon as needed for cough.  Your Covid test will result within 48 hours.  You will receive a phone call if your test is positive, no phone call if your test is negative.  You can also view your results on MyChart.  You will receive a phone call if any of your STD cultures come back positive.  If they do, the appropriate treatment will be sent to your pharmacy.  You will need to inform all partners of your positive test so that they can also get tested or treated.  Quarantine at home for at least 14 days from when your symptoms began.  Return to the emergency department if any concerning signs or symptoms develop such as persistent fevers, persistent vomiting, worsening shortness of breath, or loss of consciousness.

## 2018-11-29 NOTE — ED Provider Notes (Signed)
[facility] EMERGENCY DEPARTMENT Provider Note   CSN: [id number] Arrival date & time: 11/29/18  6712     History   Chief Complaint Chief Complaint  Patient presents with   Abdominal Pain   Vaginal Bleeding    HPI Tina Keller is a 23 y.o. female presents today for evaluation of acute onset, somewhat improving flulike symptoms for 5 days.  She reports developing subjective fevers and chills on Tuesday as well as loss of taste and smell, nasal congestion, and cough intermittently productive of sputum.  She reports the cough has significantly improved but she has had persistent shortness of breath and fatigue.  She only notes shortness of breath with exertion such as going up a flight of stairs or with standing for prolonged periods.  Denies chest pain.She has had sick contacts with her cousin and her cousin's son with similar symptoms.  For the last 3 days she has had a constant ache in her upper abdomen.  No aggravating or alleviating factors noted.  Denies nausea, vomiting, urinary symptoms, diarrhea, constipation, melena, or hematochezia.  She did develop some vaginal bleeding today which is unusual for her as she had her usual menstrual cycle from 10/10-10/16.  She states that her periods are usually very regular.  Reports the bleeding itself is very light/mild.  She is currently sexually active with multiple female partners, does not always use protection.    She has not tried anything for her symptoms.     The history is provided by the patient.    Past Medical History:  Diagnosis Date   Encounter for supervision of normal first pregnancy in second trimester 09/02/2015    Clinic [facility]) Prenatal Labs Dating  LMP Blood type: O/POS/-- (03/31 1427)  Genetic Screen 1 Screen:    AFP:     Quad: Normal    NIPS: Antibody:NEG (03/31 1427) Anatomic [place]  Repeat U/S normal  Rubella: <0.90 (03/31 1427) GTT Early: 85             Third trimester:  RPR: NON REAC (08/25 1336)   Flu vaccine  Declined  HBsAg: NEGATIVE (03/31 1427)  TDaP vaccine  Given                          Rhogam: N/A HIV: NONREACTIVE (08/25 1336)  Baby Food  Breast and Bottle                                     GBS: (For PCN allergy, check sensitivities)  Positive  Contraception  Undecided  Pap: Circumcision  Yes, [facility]   Pediatrician   Support Person       Medical history non-contributory    NSVD (normal spontaneous vaginal delivery) 12/25/2015    Patient Active Problem List   Diagnosis Date Noted   Positive GBS test 12/09/2015   Rubella non-immune status, antepartum 12/01/2015   Dermatitis 12/18/2013   Skin change 02/01/2012   CHILDHOOD OBESITY 11/26/2006    Past Surgical History:  Procedure Laterality Date   NO PAST SURGERIES       OB History    Gravida  1   Para  1   Term  1   Preterm      AB      Living  1     SAB      TAB  Ectopic      Multiple  0   Live Births  1            Home Medications    Prior to Admission medications   Medication Sig Start Date End Date Taking? Authorizing Provider  acetaminophen (TYLENOL) 500 MG tablet Take 1 tablet (500 mg total) by mouth every 6 (six) hours as needed. 11/29/18   Ellery Tash A, PA-C  benzonatate (TESSALON) 100 MG capsule Take 1 capsule (100 mg total) by mouth 3 (three) times daily as needed for cough. 11/29/18   Peggy Loge A, PA-C  Calcium Citrate-Vitamin D (CALCIUM + D PO) Take 1 tablet by mouth daily.    [provider]  ibuprofen (ADVIL,MOTRIN) 600 MG tablet Take 1 tablet (600 mg total) by mouth every 6 (six) hours as needed for mild pain, moderate pain or cramping. 12/25/15   Casey Burkitt, MD  Prenatal Vit-Fe Fumarate-FA (PRENATAL MULTIVITAMIN) TABS tablet Take 1 tablet by mouth daily at 12 noon.    [provider]  senna-docusate (SENOKOT-S) 8.6-50 MG tablet Take 2 tablets by mouth at bedtime as needed for mild constipation. 12/25/15   Casey Burkitt, MD    traMADol (ULTRAM) 50 MG tablet Take 1 tablet (50 mg total) by mouth every 6 (six) hours as needed. 09/13/17   Dione Booze, MD    Family History No family history on file.  Social History Social History   Tobacco Use   Smoking status: Former Smoker    Types: Cigarettes   Smokeless tobacco: Never Used  Substance Use Topics   Alcohol use: Yes    Comment: social not since pregnacy   Drug use: No     Allergies   Patient has no known allergies.   Review of Systems Review of Systems  Constitutional: Positive for chills and fever.  HENT: Positive for congestion.   Respiratory: Positive for cough and shortness of breath.   Cardiovascular: Negative for chest pain.  Gastrointestinal: Positive for abdominal pain. Negative for constipation, diarrhea, nausea and vomiting.  Genitourinary: Positive for vaginal bleeding. Negative for dysuria, frequency, hematuria and urgency.  All other systems reviewed and are negative.    Physical Exam Updated Vital Signs BP 131/66    Pulse 76    Temp 98.4 F (36.9 C) (Oral)    Resp (!) 25    Ht  (1.702 m)    Wt 106.6 kg    LMP 11/21/2018 (Exact Date)    SpO2 96%    BMI 36.81 kg/m   Physical Exam Vitals signs and nursing note reviewed. Exam conducted with a chaperone present.  Constitutional:      General: She is not in acute distress.    Appearance: She is well-developed.     Comments: Resting comfortably in bed  HENT:     Head: Normocephalic and atraumatic.  Eyes:     General:        Right eye: No discharge.        Left eye: No discharge.     Conjunctiva/sclera: Conjunctivae normal.  Neck:     Vascular: No JVD.     Trachea: No tracheal deviation.  Cardiovascular:     Rate and Rhythm: Normal rate.  Pulmonary:     Effort: Pulmonary effort is normal.     Comments: Speaking in full sentences without difficulty, SPO2 saturations 100% on room air at rest Abdominal:     General: Abdomen is flat. Bowel sounds are normal. There is  no distension.     Palpations: Abdomen is soft.     Tenderness: There is abdominal tenderness in the epigastric area, periumbilical area and suprapubic area. There is no right CVA tenderness, left CVA tenderness, guarding or rebound. Negative signs include Murphy's sign and Rovsing's sign.  Genitourinary:    Exam position: Lithotomy position.     Comments: Examination performed in the presence of a chaperone.  No masses or lesions to the external genitalia.  Scant amount of blood in the vaginal vault coming from the cervical os.  No friability of the cervix.  No cervical motion tenderness or adnexal tenderness. Skin:    General: Skin is warm and dry.     Findings: No erythema.  Neurological:     Mental Status: She is alert.  Psychiatric:        Behavior: Behavior normal.      ED Treatments / Results  Labs (all labs ordered are listed, but only abnormal results are displayed) Labs Reviewed  WET PREP, GENITAL - Abnormal; Notable for the following components:      Result Value   Clue Cells Wet Prep HPF POC PRESENT (*)    WBC, Wet Prep HPF POC MANY (*)    All other components within normal limits  COMPREHENSIVE METABOLIC PANEL - Abnormal; Notable for the following components:   CO2 20 (*)    AST 14 (*)    All other components within normal limits  URINALYSIS, ROUTINE W REFLEX MICROSCOPIC - Abnormal; Notable for the following components:   Color, Urine AMBER (*)    APPearance HAZY (*)    Specific Gravity, Urine 1.031 (*)    Hgb urine dipstick MODERATE (*)    Ketones, ur 5 (*)    Protein, ur 30 (*)    All other components within normal limits  NOVEL CORONAVIRUS, NAA (HOSP ORDER, SEND-OUT TO REF LAB; TAT 18-24 HRS)  LIPASE, BLOOD  CBC  RPR  HIV ANTIBODY (ROUTINE TESTING W REFLEX)  I-STAT BETA HCG BLOOD, ED ([facility], [facility], [facility] ONLY)  GC/CHLAMYDIA PROBE AMP ([place]) NOT [facility]    EKG None  Radiology Dg Chest Portable 1 View  Result Date: 11/29/2018 CLINICAL DATA:  Cough.  EXAM: PORTABLE CHEST 1 VIEW COMPARISON:  None FINDINGS: The heart size and mediastinal contours are within normal limits. Both lungs are clear. The visualized skeletal structures are unremarkable. IMPRESSION: No active disease. Electronically Signed   By: Signa Kellaylor  Stroud M.D.   On: 11/29/2018 08:08    Procedures Procedures (including critical care time)  Medications Ordered in ED Medications  sodium chloride flush (NS) 0.9 % injection 3 mL (has no administration in time range)  albuterol (VENTOLIN HFA) 108 (90 Base) MCG/ACT inhaler 1 puff (has no administration in time range)  sodium chloride 0.9 % bolus 1,000 mL (0 mLs Intravenous Stopped 11/29/18 1019)     Initial Impression / Assessment and Plan / ED Course  I have reviewed the triage vital signs and the nursing notes.  Pertinent labs & imaging results that were available during my care of the patient were reviewed by me and considered in my medical decision making (see chart for details).         Tina PotterJasmine Kelnhofer was evaluated in Emergency Department on 11/29/2018 for the symptoms described in the history of present illness. She was evaluated in the context of the global COVID-19 pandemic, which necessitated consideration that the patient might be at risk for infection with the SARS-CoV-2 virus that causes  COVID-19. Institutional protocols and algorithms that pertain to the evaluation of patients at risk for COVID-19 are in a state of rapid change based on information released by regulatory bodies including the CDC and federal and state organizations. These policies and algorithms were followed during the patient's care in the ED.  Patient presenting for evaluation of cough, nonspecific viral symptoms for 5 days.  Chest x-ray shows no acute cardiopulmonary abnormalities.  She is speaking in full sentences without difficulty with stable SPO2 saturations both at rest and with ambulation.  Offered outpatient Covid swab.  Discussed quarantining  at home per current CDC guidelines.  Also offerered symptomatic management.  Doubt PE or bacterial pneumonia.  She has some midline abdominal pain but no peritoneal signs and abdomen is soft.  She appears to be in no acute distress.  Pelvic examination shows small amount of vaginal bleeding and wet prep is consistent with BV.  No concern for PID, ovarian torsion, TOA, or ectopic pregnancy given reassuring examination, no cervical motion tenderness or adnexal tenderness.  Lab work reviewed by me shows no leukocytosis, no anemia, no metabolic derangements.  No renal insufficiency.  UA shows a small amount of blood which is likely contaminant from vaginal bleeding, does show elevated specific gravity and mild ketonuria and proteinuria so we will give IV fluids as she is likely mildly dehydrated.  She has been tolerating p.o. food and fluids at home however.  Doubt acute surgical abdominal pathology.  On reevaluation patient resting comfortably no apparent distress.  Recommend follow-up with OB/GYN for reevaluation of abnormal vaginal bleeding.  Will treat with course of Flagyl.  Discussed strict ED return precautions. Pt verbalized understanding of and agreement with plan and is safe for discharge home at this time.  Final Clinical Impressions(s) / ED Diagnoses   Final diagnoses:  Cough  DOE (dyspnea on exertion)  BV (bacterial vaginosis)  Abnormal vaginal bleeding    ED Discharge Orders         Ordered    acetaminophen (TYLENOL) 500 MG tablet  Every 6 hours PRN     11/29/18 0918    benzonatate (TESSALON) 100 MG capsule  3 times daily PRN     11/29/18 0918           Jeanie Sewer, PA-C 11/29/18 1025    Ward, Layla Maw, DO 11/29/18 2307

## 2018-11-29 NOTE — ED Triage Notes (Addendum)
Pt reports she had a cough over the past few days as well as lost of smell and nasal congestion-reports these symptoms have now resolved, states today she began having abdominal pain and moderate vaginal bleeding and is feeling very fatigued. Reports LMP was 10/16.

## 2018-11-30 LAB — NOVEL CORONAVIRUS, NAA (HOSP ORDER, SEND-OUT TO REF LAB; TAT 18-24 HRS): SARS-CoV-2, NAA: NOT DETECTED

## 2018-11-30 LAB — T.PALLIDUM AB, TOTAL: T Pallidum Abs: NONREACTIVE

## 2018-12-01 ENCOUNTER — Telehealth: Payer: Self-pay | Admitting: Family Medicine

## 2018-12-01 NOTE — Telephone Encounter (Signed)
I am out this afternoon. Please help her schedule telehealth appointment with available provider. Thanks.

## 2018-12-01 NOTE — Telephone Encounter (Signed)
Will forward to MD to advise on labs from ED.  Jazmin Hartsell,CMA

## 2018-12-01 NOTE — Telephone Encounter (Signed)
LM for patient letting her know that I scheduled her for a virtual visit tomorrow with PCP to discuss lab results. Javiana Anwar,CMA

## 2018-12-01 NOTE — Telephone Encounter (Signed)
Pt was seen in the ED on Saturday 10/24 and received her results in her mychart but would like for someone to call her to discuss some of the results that she does not fully understand. The best call back number is [phone].

## 2018-12-02 ENCOUNTER — Encounter: Payer: Self-pay | Admitting: Family Medicine

## 2018-12-02 ENCOUNTER — Other Ambulatory Visit: Payer: Self-pay

## 2018-12-02 ENCOUNTER — Telehealth (INDEPENDENT_AMBULATORY_CARE_PROVIDER_SITE_OTHER): Payer: BC Managed Care – PPO | Admitting: Family Medicine

## 2018-12-02 DIAGNOSIS — N760 Acute vaginitis: Secondary | ICD-10-CM

## 2018-12-02 DIAGNOSIS — R05 Cough: Secondary | ICD-10-CM | POA: Diagnosis not present

## 2018-12-02 DIAGNOSIS — A530 Latent syphilis, unspecified as early or late: Secondary | ICD-10-CM

## 2018-12-02 DIAGNOSIS — N923 Ovulation bleeding: Secondary | ICD-10-CM | POA: Diagnosis not present

## 2018-12-02 DIAGNOSIS — R059 Cough, unspecified: Secondary | ICD-10-CM

## 2018-12-02 LAB — GC/CHLAMYDIA PROBE AMP (~~LOC~~) NOT AT ARMC
Chlamydia: NEGATIVE
Neisseria Gonorrhea: NEGATIVE

## 2018-12-02 NOTE — Progress Notes (Signed)
[facility] Telemedicine Visit  Patient consented to have virtual visit. Method of visit: Telephone  Encounter participants: Patient: Tina Keller - located at Home    Provider: Andrena Mews - located at Office Others (if applicable): N/A  Chief Complaint: ED F/U and discuss lab result  Cough This is a new problem. The current episode started in the past 7 days. The problem has been gradually improving. Cough characteristics: Dry cough, no phlegm. Pertinent negatives include no chest pain, fever or shortness of breath. Nothing aggravates the symptoms. Treatments tried: She went to the ED few days ago and was prescribed Tessalone pearl. She is yet to pick up medication. Improvement on treatment: Improved without treatment.  Vaginal Discharge The patient's primary symptoms include vaginal discharge. This is a new problem. Episode onset: Started 3days ago. The problem occurs daily. The problem has been unchanged. The patient is experiencing no pain (No itching). She is not pregnant. Pertinent negatives include no fever. Vaginal discharge characteristics: Brownish discharge, no blood. There has been no bleeding. Treatments tried: Was prescribed an antibiotic/Metronidazole at ED and she is yet to pick it up. She is sexually active. It is unknown whether or not her partner has an STD. She uses nothing (Sometimes uses condoms) for contraception. Her menstrual history has been regular (Period is usually regularly, however, recently she bled twice this month for 3 days. She has an OB/GYN appointment on Nov 2nd).     ROS: per HPI  Pertinent PMHx:  Past Medical History:  Diagnosis Date  . Encounter for supervision of normal first pregnancy in second trimester 09/02/2015    Clinic [facility]) Prenatal Labs Dating  LMP Blood type: O/POS/-- (03/31 1427)  Genetic Screen 1 Screen:    AFP:     Quad: Normal    NIPS: Antibody:NEG (03/31 1427) Anatomic [place]  Repeat U/S normal   Rubella: <0.90 (03/31 1427) GTT Early: 85             Third trimester:  RPR: NON REAC (08/25 1336)  Flu vaccine  Declined  HBsAg: NEGATIVE (03/31 1427)  TDaP vaccine  Given                          Rhogam: N/A HIV: NONREACTIVE (08/25 1336)  Baby Food  Breast and Bottle                                     GBS: (For PCN allergy, check sensitivities)  Positive  Contraception  Undecided  Pap: Circumcision  Yes, [facility]   Pediatrician   Support Person      . Medical history non-contributory   . NSVD (normal spontaneous vaginal delivery) 12/25/2015     Physical Exam Pulmonary:     Effort: No respiratory distress.     Comments: Could complete full sentences over the telephone    Assessment/Plan:  Cough: URI improving. Use Tessalone as needed for cough. COVID-19 test negative. Result discussed.  Vaginitis: BV+ Patient confirmed that she was prescribed Metronidazole at the ED and she will pick up soon. GC/Chlamydia was not completed at the ED. She will f/u soon for that.  +RPR: Neg T pallidium As discussed with the patient this is false positive. I will also run it by the ID to ensure no further retesting needed at this time. She verbalized understanding.  Intermenstrual Bleed. F/U with Gyn  as scheduled. She will need PAP and GC/CHlam with them. She verbalized understanding.     Time spent during visit with patient: 20 minutes

## 2018-12-02 NOTE — Progress Notes (Signed)
False + since the confirmatory antibody is negative.  You don't necessarily need to repeat it; good to go.  Happy to help  Rob

## 2018-12-02 NOTE — Patient Instructions (Signed)
Vaginitis  Vaginitis is irritation and swelling (inflammation) of the vagina. It happens when normal bacteria and yeast in the vagina grow too much. There are many types of this condition. Treatment will depend on the type you have. Follow these instructions at home: Lifestyle  Keep your vagina area clean and dry. ? Avoid using soap. ? Rinse the area with water.  Do not do the following until your doctor says it is okay: ? Wash and clean out the vagina (douche). ? Use tampons. ? Have sex.  Wipe from front to back after going to the bathroom.  Let air reach your vagina. ? Wear cotton underwear. ? Do not wear: ? Underwear while you sleep. ? Tight pants. ? Thong underwear. ? Underwear or nylons without a cotton panel. ? Take off any wet clothing, such as bathing suits, as soon as possible.  Use gentle, non-scented products. Do not use things that can irritate the vagina, such as fabric softeners. Avoid the following products if they are scented: ? Feminine sprays. ? Detergents. ? Tampons. ? Feminine hygiene products. ? Soaps or bubble baths.  Practice safe sex and use condoms. General instructions  Take over-the-counter and prescription medicines only as told by your doctor.  If you were prescribed an antibiotic medicine, take or use it as told by your doctor. Do not stop taking or using the antibiotic even if you start to feel better.  Keep all follow-up visits as told by your doctor. This is important. Contact a doctor if:  You have pain in your belly.  You have a fever.  Your symptoms last for more than 2-3 days. Get help right away if:  You have a fever and your symptoms get worse all of a sudden. Summary  Vaginitis is irritation and swelling of the vagina. It can happen when the normal bacteria and yeast in the vagina grow too much. There are many types.  Treatment will depend on the type you have.  Do not douche, use tampons , or have sex until your health  care provider approves. When you can return to sex, practice safe sex and use condoms. This information is not intended to replace advice given to you by your health care provider. Make sure you discuss any questions you have with your health care provider. Document Released: 04/20/2008 Document Revised: 01/04/2017 Document Reviewed: 02/14/2016 Elsevier Patient Education  2020 Elsevier Inc.  

## 2018-12-03 ENCOUNTER — Telehealth: Payer: Self-pay | Admitting: *Deleted

## 2018-12-03 NOTE — Telephone Encounter (Signed)
Pt called regarding Rx not being sent to pharmacy.  EDCM will contact EDP for advice.

## 2018-12-04 ENCOUNTER — Other Ambulatory Visit: Payer: Self-pay | Admitting: Family Medicine

## 2018-12-04 ENCOUNTER — Telehealth: Payer: Self-pay

## 2018-12-04 ENCOUNTER — Telehealth: Payer: Self-pay | Admitting: *Deleted

## 2018-12-04 MED ORDER — METRONIDAZOLE 500 MG PO TABS: 500.0000 mg | ORAL_TABLET | Freq: Two times a day (BID) | ORAL | 0 refills | Status: AC

## 2018-12-04 NOTE — Telephone Encounter (Signed)
TOC CM contacted pt and left HIPAA compliant message for return call. Davelle Anselmi RN CCM, [facility] ED TOC CM [phone] 

## 2018-12-04 NOTE — Telephone Encounter (Signed)
Pt called nurse line. Went to ED, was told a Rx would be sent to the pharmacy for BV. The pharmacy said they don't have a Rx. Pt has called ED. Was told the Dr. Lynnda Child be notified. No one has sent the Rx or called the pt. Pt is asking if her Dr. Lynnda Child send the Rx. Pt said everything is in her ED notes. Pt can be reached at [phone]. Ottis Stain, CMA

## 2018-12-04 NOTE — Telephone Encounter (Signed)
Please advise her that I have sent the medication to the pharmacy listed. Thanks.

## 2018-12-04 NOTE — Telephone Encounter (Signed)
Pt informed.  Ascencion Stegner, CMA  

## 2018-12-09 ENCOUNTER — Encounter: Payer: Self-pay | Admitting: Obstetrics

## 2018-12-09 ENCOUNTER — Ambulatory Visit (INDEPENDENT_AMBULATORY_CARE_PROVIDER_SITE_OTHER): Payer: BC Managed Care – PPO | Admitting: Obstetrics

## 2018-12-09 ENCOUNTER — Ambulatory Visit: Payer: BC Managed Care – PPO

## 2018-12-09 ENCOUNTER — Other Ambulatory Visit: Payer: Self-pay

## 2018-12-09 VITALS — BP 114/65 | HR 64 | Ht 67.5 in | Wt 245.5 lb

## 2018-12-09 DIAGNOSIS — Z01419 Encounter for gynecological examination (general) (routine) without abnormal findings: Secondary | ICD-10-CM | POA: Diagnosis not present

## 2018-12-09 DIAGNOSIS — N939 Abnormal uterine and vaginal bleeding, unspecified: Secondary | ICD-10-CM

## 2018-12-09 DIAGNOSIS — Z3009 Encounter for other general counseling and advice on contraception: Secondary | ICD-10-CM

## 2018-12-09 DIAGNOSIS — N898 Other specified noninflammatory disorders of vagina: Secondary | ICD-10-CM

## 2018-12-09 DIAGNOSIS — Z124 Encounter for screening for malignant neoplasm of cervix: Secondary | ICD-10-CM

## 2018-12-09 DIAGNOSIS — Z113 Encounter for screening for infections with a predominantly sexual mode of transmission: Secondary | ICD-10-CM

## 2018-12-09 DIAGNOSIS — E669 Obesity, unspecified: Secondary | ICD-10-CM

## 2018-12-09 DIAGNOSIS — Z3202 Encounter for pregnancy test, result negative: Secondary | ICD-10-CM | POA: Diagnosis not present

## 2018-12-09 LAB — POCT URINE PREGNANCY: Preg Test, Ur: NEGATIVE

## 2018-12-09 NOTE — Progress Notes (Signed)
Patient reports LMP 11-15-18 and ended on 11-21-18, pt states that she has been having bleeding on and off since 11-27-18, today she reports that bleeding is bright red.

## 2018-12-09 NOTE — Progress Notes (Signed)
Subjective:        Tina Keller is a 23 y.o. female here for a routine exam.  Current complaints: Had a normal period starting 11-15-2018, then started bleeding again this past Sunday.  She was seen in the ER and work-up was remarkable for BV, and she had an URI.  She was instructed to follow up with [place].  She is having light vaginal bleeding today.  Periods have historically been normal.  Personal health questionnaire:  Is patient Ashkenazi Jewish, have a family history of breast and/or ovarian cancer: no Is there a family history of uterine cancer diagnosed at age < 61, gastrointestinal cancer, urinary tract cancer, family member who is a Field seismologist syndrome-associated carrier: no Is the patient overweight and hypertensive, family history of diabetes, personal history of gestational diabetes, preeclampsia or PCOS: yes Is patient over 90, have PCOS,  family history of premature CHD under age 67, diabetes, smoke, have hypertension or peripheral artery disease:  no At any time, has a partner hit, kicked or otherwise hurt or frightened you?: no Over the past 2 weeks, have you felt down, depressed or hopeless?: no Over the past 2 weeks, have you felt little interest or pleasure in doing things?:no   Gynecologic History Patient's last menstrual period was 11/15/2018. Contraception: none Last Pap: n/a. Results were: n/a Last mammogram: n/a. Results were: n/a  Obstetric History OB History  Gravida Para Term Preterm AB Living  1 1 1     1   SAB TAB Ectopic Multiple Live Births        0 1    # Outcome Date GA Lbr Len/2nd Weight Sex Delivery Anes PTL Lv  1 Term 12/23/15 [redacted]w[redacted]d 12:16 / 00:25 7 lb 1.2 oz (3.21 kg) M Vag-Spont EPI  LIV    Past Medical History:  Diagnosis Date  . Encounter for supervision of normal first pregnancy in second trimester 09/02/2015    Clinic [facility]) Prenatal Labs Dating  LMP Blood type: O/POS/-- (03/31 1427)  Genetic Screen 1 Screen:    AFP:     Quad: Normal     NIPS: Antibody:NEG (03/31 1427) Anatomic [place]  Repeat U/S normal  Rubella: <0.90 (03/31 1427) GTT Early: 85             Third trimester:  RPR: NON REAC (08/25 1336)  Flu vaccine  Declined  HBsAg: NEGATIVE (03/31 1427)  TDaP vaccine  Given                          Rhogam: N/A HIV: NONREACTIVE (08/25 1336)  Baby Food  Breast and Bottle                                     GBS: (For PCN allergy, check sensitivities)  Positive  Contraception  Undecided  Pap: Circumcision  Yes, [facility]   Pediatrician   Support Person      . Medical history non-contributory   . NSVD (normal spontaneous vaginal delivery) 12/25/2015    Past Surgical History:  Procedure Laterality Date  . NO PAST SURGERIES       Current Outpatient Medications:  .  benzonatate (TESSALON) 100 MG capsule, Take 1 capsule (100 mg total) by mouth 3 (three) times daily as needed for cough., Disp: 21 capsule, Rfl: 0 .  metroNIDAZOLE (FLAGYL) 500 MG tablet, Take 1 tablet (500 mg total) by  mouth 2 (two) times daily for 7 days., Disp: 14 tablet, Rfl: 0 .  acetaminophen (TYLENOL) 500 MG tablet, Take 1 tablet (500 mg total) by mouth every 6 (six) hours as needed. (Patient not taking: Reported on 12/02/2018), Disp: 30 tablet, Rfl: 0 .  Prenatal Vit-Fe Fumarate-FA (PRENATAL MULTIVITAMIN) TABS tablet, Take 1 tablet by mouth daily at 12 noon., Disp: , Rfl:  No Known Allergies  Social History   Tobacco Use  . Smoking status: Former Smoker    Types: Cigarettes  . Smokeless tobacco: Never Used  Substance Use Topics  . Alcohol use: Yes    Comment: social not since pregnacy    History reviewed. No pertinent family history.    Review of Systems  Constitutional: negative for fatigue and weight loss Respiratory: negative for cough and wheezing Cardiovascular: negative for chest pain, fatigue and palpitations Gastrointestinal: negative for abdominal pain and change in bowel habits Musculoskeletal:negative for myalgias Neurological: negative for gait  problems and tremors Behavioral/Psych: negative for abusive relationship, depression Endocrine: negative for temperature intolerance    Genitourinary:negative for abnormal menstrual periods, genital lesions, hot flashes, sexual problems and vaginal discharge Integument/breast: negative for breast lump, breast tenderness, nipple discharge and skin lesion(s)    Objective:       BP 114/65   Pulse 64   Ht 5' 7.5" (1.715 m)   Wt 245 lb 8 oz (111.4 kg)   LMP 11/15/2018   BMI 37.88 kg/m  General:   alert  Skin:   no rash or abnormalities  Lungs:   clear to auscultation bilaterally  Heart:   regular rate and rhythm, S1, S2 normal, no murmur, click, rub or gallop  Breasts:   normal without suspicious masses, skin or nipple changes or axillary nodes  Abdomen:  normal findings: no organomegaly, soft, non-tender and no hernia  Pelvis:  External genitalia: normal general appearance Urinary system: urethral meatus normal and bladder without fullness, nontender Vaginal: normal without tenderness, induration or masses Cervix: normal appearance Adnexa: normal bimanual exam Uterus: anteverted and non-tender, normal size   Lab Review Urine pregnancy test Labs reviewed yes Radiologic studies reviewed no  50% of 25 min visit spent on counseling and coordination of care.   Assessment:     1. Encounter for routine gynecological examination with Papanicolaou smear of cervix Rx: - Cytology - PAP( [place])  2. Vaginal discharge Rx: - Cervicovaginal ancillary only( [place])  3. Abnormal uterine bleeding (AUB) Rx: - POCT urine pregnancy  4. Encounter for other general counseling and advice on contraception - declines contraception - condom use recommended at all times  5. Obesity (BMI 35.0-39.9 without comorbidity) - program of caloric reduction, exercise and behavioral modification recommended   Plan:    Education reviewed: calcium supplements, depression evaluation, low fat,  low cholesterol diet, safe sex/STD prevention, self breast exams and weight bearing exercise. Contraception: none. Follow up in: 1 year.    Orders Placed This Encounter  Procedures  . POCT urine pregnancy    Brock Bad, MD 12/09/2018 4:21 PM

## 2018-12-11 LAB — CERVICOVAGINAL ANCILLARY ONLY
Chlamydia: NEGATIVE
Comment: NEGATIVE
Comment: NORMAL
Neisseria Gonorrhea: NEGATIVE

## 2018-12-15 LAB — CYTOLOGY - PAP
Diagnosis: NEGATIVE
Diagnosis: REACTIVE

## 2018-12-18 ENCOUNTER — Ambulatory Visit: Payer: BC Managed Care – PPO | Admitting: Obstetrics & Gynecology

## 2019-01-14 ENCOUNTER — Other Ambulatory Visit: Payer: Self-pay

## 2019-01-14 DIAGNOSIS — Z20822 Contact with and (suspected) exposure to covid-19: Secondary | ICD-10-CM

## 2019-01-15 LAB — NOVEL CORONAVIRUS, NAA: SARS-CoV-2, NAA: NOT DETECTED

## 2019-03-02 DIAGNOSIS — Z20828 Contact with and (suspected) exposure to other viral communicable diseases: Secondary | ICD-10-CM | POA: Diagnosis not present

## 2019-06-15 ENCOUNTER — Encounter: Payer: Self-pay | Admitting: Family Medicine

## 2019-06-16 ENCOUNTER — Ambulatory Visit: Payer: BC Managed Care – PPO | Admitting: Family Medicine

## 2019-08-16 DIAGNOSIS — Z20822 Contact with and (suspected) exposure to covid-19: Secondary | ICD-10-CM | POA: Diagnosis not present

## 2019-08-16 DIAGNOSIS — Z03818 Encounter for observation for suspected exposure to other biological agents ruled out: Secondary | ICD-10-CM | POA: Diagnosis not present

## 2019-08-29 ENCOUNTER — Ambulatory Visit (HOSPITAL_COMMUNITY)
Admission: EM | Admit: 2019-08-29 | Discharge: 2019-08-29 | Disposition: A | Discharge status: Home / Self Care | Payer: BC Managed Care – PPO | Attending: Family Medicine | Admitting: Family Medicine

## 2019-08-29 ENCOUNTER — Encounter (HOSPITAL_COMMUNITY): Payer: Self-pay | Admitting: *Deleted

## 2019-08-29 ENCOUNTER — Other Ambulatory Visit: Payer: Self-pay

## 2019-08-29 DIAGNOSIS — K047 Periapical abscess without sinus: Secondary | ICD-10-CM | POA: Diagnosis not present

## 2019-08-29 DIAGNOSIS — R220 Localized swelling, mass and lump, head: Secondary | ICD-10-CM | POA: Diagnosis not present

## 2019-08-29 DIAGNOSIS — K0889 Other specified disorders of teeth and supporting structures: Secondary | ICD-10-CM | POA: Diagnosis not present

## 2019-08-29 MED ORDER — AMOXICILLIN-POT CLAVULANATE 875-125 MG PO TABS: 1.0000 | ORAL_TABLET | Freq: Two times a day (BID) | ORAL | 0 refills | Status: AC

## 2019-08-29 MED ORDER — IBUPROFEN 800 MG PO TABS: 800.0000 mg | ORAL_TABLET | Freq: Three times a day (TID) | ORAL | 0 refills | Status: DC | PRN

## 2019-08-29 NOTE — ED Provider Notes (Incomplete)
[facility]    CSN: [id number] Arrival date & time: 08/29/19  1627      History   Chief Complaint Chief Complaint  Patient presents with  . Dental Pain    HPI Tina Keller is a 24 y.o. female.   HPI  Past Medical History:  Diagnosis Date  . Encounter for supervision of normal first pregnancy in second trimester 09/02/2015    Clinic [facility]) Prenatal Labs Dating  LMP Blood type: O/POS/-- (03/31 1427)  Genetic Screen 1 Screen:    AFP:     Quad: Normal    NIPS: Antibody:NEG (03/31 1427) Anatomic [place]  Repeat U/S normal  Rubella: <0.90 (03/31 1427) GTT Early: 85             Third trimester:  RPR: NON REAC (08/25 1336)  Flu vaccine  Declined  HBsAg: NEGATIVE (03/31 1427)  TDaP vaccine  Given                          Rhogam: N/A HIV: NONREACTIVE (08/25 1336)  Baby Food  Breast and Bottle                                     GBS: (For PCN allergy, check sensitivities)  Positive  Contraception  Undecided  Pap: Circumcision  Yes, [facility]   Pediatrician   Support Person      . NSVD (normal spontaneous vaginal delivery) 12/25/2015    Patient Active Problem List   Diagnosis Date Noted  . Positive GBS test 12/09/2015  . Rubella non-immune status, antepartum 12/01/2015  . CHILDHOOD OBESITY 11/26/2006    Past Surgical History:  Procedure Laterality Date  . NO PAST SURGERIES      OB History    Gravida  1   Para  1   Term  1   Preterm      AB      Living  1     SAB      TAB      Ectopic      Multiple  0   Live Births  1            Home Medications    Prior to Admission medications   Medication Sig Start Date End Date Taking? Authorizing Provider  acetaminophen (TYLENOL) 500 MG tablet Take 1 tablet (500 mg total) by mouth every 6 (six) hours as needed. Patient not taking: Reported on 12/02/2018 11/29/18   Michela Pitcher A, PA-C  amoxicillin-clavulanate (AUGMENTIN) 875-125 MG tablet Take 1 tablet by mouth 2 (two) times daily for 10 days. 08/29/19 09/08/19   Moshe Cipro, NP  benzonatate (TESSALON) 100 MG capsule Take 1 capsule (100 mg total) by mouth 3 (three) times daily as needed for cough. 11/29/18   Fawze, Mina A, PA-C  ibuprofen (ADVIL) 800 MG tablet Take 1 tablet (800 mg total) by mouth every 8 (eight) hours as needed for moderate pain. 08/29/19   Moshe Cipro, NP  Prenatal Vit-Fe Fumarate-FA (PRENATAL MULTIVITAMIN) TABS tablet Take 1 tablet by mouth daily at 12 noon.    [provider]    Family History Family History  Problem Relation Age of Onset  . Healthy Mother   . Healthy Father     Social History Social History   Tobacco Use  . Smoking status: Former Smoker    Types: Cigarettes  .  Smokeless tobacco: Never Used  Vaping Use  . Vaping Use: Never used  Substance Use Topics  . Alcohol use: Yes    Comment: socially  . Drug use: No     Allergies   Patient has no known allergies.   Review of Systems Review of Systems   Physical Exam Triage Vital Signs ED Triage Vitals [08/29/19 1647]  Enc Vitals Group     BP (!) 134/59     Pulse Rate 81     Resp 18     Temp 98.6 F (37 C)     Temp Source Oral     SpO2 100 %     Weight      Height      Head Circumference      Peak Flow      Pain Score 6     Pain Loc      Pain Edu?      Excl. in GC?    No data found.  Updated Vital Signs BP (!) 134/59   Pulse 81   Temp 98.6 F (37 C) (Oral)   Resp 18   LMP 08/17/2019 (Approximate)   SpO2 100%   Breastfeeding No   Visual Acuity Right Eye Distance:   Left Eye Distance:   Bilateral Distance:    Right Eye Near:   Left Eye Near:    Bilateral Near:     Physical Exam   UC Treatments / Results  Labs (all labs ordered are listed, but only abnormal results are displayed) Labs Reviewed - No data to display  EKG   Radiology No results found.  Procedures Procedures (including critical care time)  Medications Ordered in UC Medications - No data to display  Initial Impression /  Assessment and Plan / UC Course  I have reviewed the triage vital signs and the nursing notes.  Pertinent labs & imaging results that were available during my care of the patient were reviewed by me and considered in my medical decision making (see chart for details).     *** Final Clinical Impressions(s) / UC Diagnoses   Final diagnoses:  Pain, dental  Dental infection  Facial swelling     Discharge Instructions     Take the antibiotic as prescribed.    A dental resource guide is attached.  Please call to make an appointment with a dentist as soon as possible.    Go to the emergency department if you have acute worsening symptoms.       ED Prescriptions    Medication Sig Dispense Auth. Provider   amoxicillin-clavulanate (AUGMENTIN) 875-125 MG tablet Take 1 tablet by mouth 2 (two) times daily for 10 days. 20 tablet Moshe Cipro, NP   ibuprofen (ADVIL) 800 MG tablet Take 1 tablet (800 mg total) by mouth every 8 (eight) hours as needed for moderate pain. 21 tablet Moshe Cipro, NP     PDMP not reviewed this encounter.

## 2019-08-29 NOTE — Discharge Instructions (Signed)
Take the antibiotic as prescribed.    A dental resource guide is attached.  Please call to make an appointment with a dentist as soon as possible.    Go to the emergency department if you have acute worsening symptoms.     

## 2019-08-29 NOTE — ED Triage Notes (Signed)
C/O starting with left upper and lower dental pain 2 days ago; now c/o left cheek swelling.  Denies fevers.

## 2019-09-02 NOTE — ED Provider Notes (Signed)
[facility]   [id number] 08/29/19 Arrival Time: 1627  CC: DENTAL pain  SUBJECTIVE:  Tina Keller is a 24 y.o. female who presents with dental pain for the last 2 days. Reports that she is having pain in her upper and lower jaw. Reports facial swelling today. Reports that pain is worse with chewing. Reports that she has a dental appointment next week. Has tried OTC analgesics without relief. Does not see a dentist regularly. Reports/ Denies similar symptoms in the past. Denies fever, chills, dysphagia, nausea, vomiting, chest pain, SOB.    ROS: As per HPI.  All other pertinent ROS negative.     Past Medical History:  Diagnosis Date  . Encounter for supervision of normal first pregnancy in second trimester 09/02/2015    Clinic [facility]) Prenatal Labs Dating  LMP Blood type: O/POS/-- (03/31 1427)  Genetic Screen 1 Screen:    AFP:     Quad: Normal    NIPS: Antibody:NEG (03/31 1427) Anatomic [place]  Repeat U/S normal  Rubella: <0.90 (03/31 1427) GTT Early: 85             Third trimester:  RPR: NON REAC (08/25 1336)  Flu vaccine  Declined  HBsAg: NEGATIVE (03/31 1427)  TDaP vaccine  Given                          Rhogam: N/A HIV: NONREACTIVE (08/25 1336)  Baby Food  Breast and Bottle                                     GBS: (For PCN allergy, check sensitivities)  Positive  Contraception  Undecided  Pap: Circumcision  Yes, [facility]   Pediatrician   Support Person      . NSVD (normal spontaneous vaginal delivery) 12/25/2015   Past Surgical History:  Procedure Laterality Date  . NO PAST SURGERIES     No Known Allergies No current facility-administered medications on file prior to encounter.   Current Outpatient Medications on File Prior to Encounter  Medication Sig Dispense Refill  . acetaminophen (TYLENOL) 500 MG tablet Take 1 tablet (500 mg total) by mouth every 6 (six) hours as needed. (Patient not taking: Reported on 12/02/2018) 30 tablet 0  . benzonatate (TESSALON) 100 MG capsule Take  1 capsule (100 mg total) by mouth 3 (three) times daily as needed for cough. 21 capsule 0  . Prenatal Vit-Fe Fumarate-FA (PRENATAL MULTIVITAMIN) TABS tablet Take 1 tablet by mouth daily at 12 noon.     Social History   Socioeconomic History  . Marital status: Single    Spouse name: Not on file  . Number of children: Not on file  . Years of education: Not on file  . Highest education level: Not on file  Occupational History  . Not on file  Tobacco Use  . Smoking status: Former Smoker    Types: Cigarettes  . Smokeless tobacco: Never Used  Vaping Use  . Vaping Use: Never used  Substance and Sexual Activity  . Alcohol use: Yes    Comment: socially  . Drug use: No  . Sexual activity: Yes    Birth control/protection: None  Other Topics Concern  . Not on file  Social History Narrative  . Not on file   Social Determinants of Health   Financial Resource Strain:   . Difficulty of Paying Living Expenses:  Food Insecurity:   . Worried About Programme researcher, broadcasting/film/video in the Last Year:   . Barista in the Last Year:   Transportation Needs:   . Freight forwarder (Medical):   Marland Kitchen Lack of Transportation (Non-Medical):   Physical Activity:   . Days of Exercise per Week:   . Minutes of Exercise per Session:   Stress:   . Feeling of Stress :   Social Connections:   . Frequency of Communication with Friends and Family:   . Frequency of Social Gatherings with Friends and Family:   . Attends Religious Services:   . Active Member of Clubs or Organizations:   . Attends Banker Meetings:   Marland Kitchen Marital Status:   Intimate Partner Violence:   . Fear of Current or Ex-Partner:   . Emotionally Abused:   Marland Kitchen Physically Abused:   . Sexually Abused:    Family History  Problem Relation Age of Onset  . Healthy Mother   . Healthy Father     OBJECTIVE:  Vitals:   08/29/19 1647  BP: (!) 134/59  Pulse: 81  Resp: 18  Temp: 98.6 F (37 C)  TempSrc: Oral  SpO2: 100%      General appearance: alert; no distress, left facial swelling HENT: normocephalic; atraumatic; dentition: poor; poor over the left upper and lower, without areas of fluctuance Neck: supple without LAD Lungs: normal respirations Skin: warm and dry Psychological: alert and cooperative; normal mood and affect  ASSESSMENT & PLAN:  1. Pain, dental   2. Dental infection   3. Facial swelling     Meds ordered this encounter  Medications  . amoxicillin-clavulanate (AUGMENTIN) 875-125 MG tablet    Sig: Take 1 tablet by mouth 2 (two) times daily for 10 days.    Dispense:  20 tablet    Refill:  0    Order Specific Question:   Supervising Provider    Answer:   Merrilee Jansky [id number]  . ibuprofen (ADVIL) 800 MG tablet    Sig: Take 1 tablet (800 mg total) by mouth every 8 (eight) hours as needed for moderate pain.    Dispense:  21 tablet    Refill:  0    Order Specific Question:   Supervising Provider    Answer:   Merrilee Jansky [id number]     Augmentin prescribed Ibuprofen prescribed.  Use as directed for pain relief Recommend soft diet until evaluated by dentist Maintain oral hygiene care Follow up with dentist as soon as possible for further evaluation and treatment  Return or go to the ED if you have any new or worsening symptoms such as fever, chills, difficulty swallowing, painful swallowing, oral or neck swelling, nausea, vomiting, chest pain, SOB.  Reviewed expectations re: course of current medical issues. Questions answered. Outlined signs and symptoms indicating need for more acute intervention. Patient verbalized understanding. After Visit Summary given.   Moshe Cipro, NP 09/02/19 1234

## 2019-09-17 ENCOUNTER — Telehealth: Payer: Self-pay | Admitting: Family Medicine

## 2019-09-17 NOTE — Telephone Encounter (Signed)
Whenever she calls back, please help her schedule annual wellness as she is due for one. Thanks.

## 2019-09-22 ENCOUNTER — Ambulatory Visit: Payer: BC Managed Care – PPO | Admitting: Family Medicine

## 2019-11-10 ENCOUNTER — Ambulatory Visit: Payer: BC Managed Care – PPO | Admitting: Family Medicine

## 2019-11-10 ENCOUNTER — Telehealth: Payer: Self-pay | Admitting: Family Medicine

## 2019-11-10 NOTE — Telephone Encounter (Signed)
She missed her appointment again today. I discussed our no-show policy with her. Since she has more than 3 no-shows in the last few months, I advised her that her next no-show will lead to dismissal from the practice. She agreed with the plan. Appointment rescheduled.

## 2019-11-13 ENCOUNTER — Other Ambulatory Visit: Payer: Self-pay

## 2019-11-13 ENCOUNTER — Encounter: Payer: Self-pay | Admitting: Family Medicine

## 2019-11-13 ENCOUNTER — Ambulatory Visit: Payer: BC Managed Care – PPO | Admitting: Family Medicine

## 2019-11-13 ENCOUNTER — Other Ambulatory Visit (HOSPITAL_COMMUNITY)
Admission: RE | Admit: 2019-11-13 | Discharge: 2019-11-13 | Disposition: A | Discharge status: Home / Self Care | Payer: BC Managed Care – PPO | Source: Ambulatory Visit | Attending: Family Medicine | Admitting: Family Medicine

## 2019-11-13 VITALS — BP 112/60 | Ht 68.0 in | Wt 264.0 lb

## 2019-11-13 DIAGNOSIS — Z0000 Encounter for general adult medical examination without abnormal findings: Secondary | ICD-10-CM | POA: Diagnosis not present

## 2019-11-13 DIAGNOSIS — Z113 Encounter for screening for infections with a predominantly sexual mode of transmission: Secondary | ICD-10-CM | POA: Insufficient documentation

## 2019-11-13 DIAGNOSIS — Z1159 Encounter for screening for other viral diseases: Secondary | ICD-10-CM | POA: Diagnosis not present

## 2019-11-13 DIAGNOSIS — E8881 Metabolic syndrome: Secondary | ICD-10-CM | POA: Diagnosis not present

## 2019-11-13 NOTE — Patient Instructions (Signed)

## 2019-11-13 NOTE — Progress Notes (Signed)
Subjective:     Tina Keller is a 24 y.o. female and is here for a comprehensive physical exam. The patient reports problems - Cancer screen, she has belly pain on and off.. Her dad's sister recently died from leg cancer?Marland Kitchen He is concern she might have cancer.  Working out x 2 days, eating healthy diet to improve on her weight. Denies any other concerns. She starts work with [employer] next week. She is excited about that.  Social History   Socioeconomic History  . Marital status: Single    Spouse name: Not on file  . Number of children: Not on file  . Years of education: Not on file  . Highest education level: Not on file  Occupational History  . Not on file  Tobacco Use  . Smoking status: Former Smoker    Types: Cigarettes  . Smokeless tobacco: Never Used  Vaping Use  . Vaping Use: Never used  Substance and Sexual Activity  . Alcohol use: Yes    Comment: socially  . Drug use: No  . Sexual activity: Yes    Birth control/protection: None  Other Topics Concern  . Not on file  Social History Narrative  . Not on file   Social Determinants of Health   Financial Resource Strain:   . Difficulty of Paying Living Expenses: Not on file  Food Insecurity:   . Worried About Programme researcher, broadcasting/film/video in the Last Year: Not on file  . Ran Out of Food in the Last Year: Not on file  Transportation Needs:   . Lack of Transportation (Medical): Not on file  . Lack of Transportation (Non-Medical): Not on file  Physical Activity:   . Days of Exercise per Week: Not on file  . Minutes of Exercise per Session: Not on file  Stress:   . Feeling of Stress : Not on file  Social Connections:   . Frequency of Communication with Friends and Family: Not on file  . Frequency of Social Gatherings with Friends and Family: Not on file  . Attends Religious Services: Not on file  . Active Member of Clubs or Organizations: Not on file  . Attends Banker Meetings: Not on file  . Marital Status:  Not on file  Intimate Partner Violence:   . Fear of Current or Ex-Partner: Not on file  . Emotionally Abused: Not on file  . Physically Abused: Not on file  . Sexually Abused: Not on file   Health Maintenance  Topic Date Due  . Hepatitis C Screening  Never done  . COVID-19 Vaccine (1) Never done  . PAP-Cervical Cytology Screening  12/08/2021  . PAP SMEAR-Modifier  12/08/2021  . TETANUS/TDAP  10/19/2025  . INFLUENZA VACCINE  Completed  . HIV Screening  Completed    The following portions of the patient's history were reviewed and updated as appropriate: allergies, current medications, past family history, past medical history, past social history, past surgical history and problem list.  Review of Systems Pertinent items noted in HPI and remainder of comprehensive ROS otherwise negative.   Objective:   Vitals:   11/13/19 1107  BP: 112/60  Weight: 264 lb (119.7 kg)  Height: 5\' 8"  (1.727 m)   Body mass index is 40.14 kg/m.   BP 112/60   Ht 5\' 8"  (1.727 m)   Wt 264 lb (119.7 kg)   LMP 11/08/2019   BMI 40.14 kg/m  General appearance: alert and cooperative Head: Normocephalic, without obvious abnormality, atraumatic Eyes:  conjunctivae/corneas clear. PERRL, EOM's intact. Fundi benign. Ears: normal TM's and external ear canals both ears Throat: lips, mucosa, and tongue normal; teeth and gums normal Neck: no adenopathy, no carotid bruit, no JVD, supple, symmetrical, trachea midline and thyroid not enlarged, symmetric, no tenderness/mass/nodules Back: symmetric, no curvature. ROM normal. No CVA tenderness. Lungs: clear to auscultation bilaterally Heart: regular rate and rhythm, S1, S2 normal, no murmur, click, rub or gallop Abdomen: soft, non-tender; bowel sounds normal; no masses,  no organomegaly Pelvic: She declined exam since she is just getting off her period. No hx of vagina discharge Extremities: extremities normal, atraumatic, no cyanosis or edema Pulses: 2+ and  symmetric Skin: Skin color, texture, turgor normal. No rashes or lesions Lymph nodes: Cervical, supraclavicular, and axillary nodes normal. Neurologic: Alert and oriented X 3, normal strength and tone. Normal symmetric reflexes. Normal coordination and gait      Office Visit from 11/13/2019 in [facility]  PHQ-9 Total Score 1      Assessment:    Healthy female exam.  Plan:  Normal exam. Up to date with flu shot. Got her COVID-19 shot today. Patient reassured that given her age and low risk family hx of cancer, we do not need to do extensive cancer screening. I advised if she has any symptoms like GI bleed, weight lose, chronic fatigue we can readdress this. Diet and exercise counseling done for her weight. She is deliberate about losing weight. Cmet, FLP and CBC checked for metabolic syndrome in an obese patient. Hep C screening done. Discussed yearly GC/Chlamydia check in patient <25 yrs. She agreed to urine screening. Up to date with colon cancer screen. Follow-up as needed.   See After Visit Summary for Counseling Recommendations

## 2019-11-14 LAB — CBC WITH DIFFERENTIAL/PLATELET
Basophils Absolute: 0 10*3/uL (ref 0.0–0.2)
Basos: 0 %
EOS (ABSOLUTE): 0.1 10*3/uL (ref 0.0–0.4)
Eos: 1 %
Hematocrit: 35.1 % (ref 34.0–46.6)
Hemoglobin: 11.2 g/dL (ref 11.1–15.9)
Immature Grans (Abs): 0 10*3/uL (ref 0.0–0.1)
Immature Granulocytes: 0 %
Lymphocytes Absolute: 2.4 10*3/uL (ref 0.7–3.1)
Lymphs: 44 %
MCH: 27.3 pg (ref 26.6–33.0)
MCHC: 31.9 g/dL (ref 31.5–35.7)
MCV: 85 fL (ref 79–97)
Monocytes Absolute: 0.5 10*3/uL (ref 0.1–0.9)
Monocytes: 9 %
Neutrophils Absolute: 2.6 10*3/uL (ref 1.4–7.0)
Neutrophils: 46 %
Platelets: 207 10*3/uL (ref 150–450)
RBC: 4.11 x10E6/uL (ref 3.77–5.28)
RDW: 12.9 % (ref 11.7–15.4)
WBC: 5.5 10*3/uL (ref 3.4–10.8)

## 2019-11-14 LAB — CMP14+EGFR
ALT: 15 IU/L (ref 0–32)
AST: 15 IU/L (ref 0–40)
Albumin/Globulin Ratio: 1.7 (ref 1.2–2.2)
Albumin: 4.5 g/dL (ref 3.9–5.0)
Alkaline Phosphatase: 44 IU/L (ref 44–121)
BUN/Creatinine Ratio: 18 (ref 9–23)
BUN: 13 mg/dL (ref 6–20)
Bilirubin Total: 0.3 mg/dL (ref 0.0–1.2)
CO2: 22 mmol/L (ref 20–29)
Calcium: 9.7 mg/dL (ref 8.7–10.2)
Chloride: 104 mmol/L (ref 96–106)
Creatinine, Ser: 0.74 mg/dL (ref 0.57–1.00)
GFR calc Af Amer: 131 mL/min/{1.73_m2} (ref >59)
GFR calc non Af Amer: 114 mL/min/{1.73_m2} (ref >59)
Globulin, Total: 2.6 g/dL (ref 1.5–4.5)
Glucose: 93 mg/dL (ref 65–99)
Potassium: 4 mmol/L (ref 3.5–5.2)
Sodium: 141 mmol/L (ref 134–144)
Total Protein: 7.1 g/dL (ref 6.0–8.5)

## 2019-11-14 LAB — HEPATITIS C ANTIBODY: Hep C Virus Ab: <0.1 s/co ratio (ref 0.0–0.9)

## 2019-11-14 LAB — LIPID PANEL
Chol/HDL Ratio: 2.5 ratio (ref 0.0–4.4)
Cholesterol, Total: 172 mg/dL (ref 100–199)
HDL: 69 mg/dL (ref >39)
LDL Chol Calc (NIH): 96 mg/dL (ref 0–99)
Triglycerides: 28 mg/dL (ref 0–149)
VLDL Cholesterol Cal: 7 mg/dL (ref 5–40)

## 2019-11-15 LAB — URINE CYTOLOGY ANCILLARY ONLY
Chlamydia: NEGATIVE
Comment: NEGATIVE
Comment: NEGATIVE
Comment: NORMAL
Neisseria Gonorrhea: NEGATIVE
Trichomonas: NEGATIVE

## 2019-11-16 ENCOUNTER — Encounter: Payer: Self-pay | Admitting: Family Medicine

## 2019-12-03 ENCOUNTER — Telehealth: Payer: Self-pay | Admitting: Family Medicine

## 2019-12-03 NOTE — Telephone Encounter (Signed)
HIPAA compliant callback message left.  Please remind patient she is due for second dose of her COVID-19 vaccine on 12/04/19.  Thanks.

## 2019-12-04 ENCOUNTER — Ambulatory Visit: Payer: BC Managed Care – PPO

## 2020-03-20 ENCOUNTER — Inpatient Hospital Stay (HOSPITAL_COMMUNITY): Admission: AD | Admit: 2020-03-20 | Payer: BC Managed Care – PPO | Source: Home / Self Care | Admitting: Family Medicine

## 2020-05-05 ENCOUNTER — Encounter: Payer: Self-pay | Admitting: Family Medicine

## 2020-05-06 ENCOUNTER — Ambulatory Visit: Payer: BC Managed Care – PPO | Admitting: Family Medicine

## 2020-07-01 ENCOUNTER — Telehealth: Payer: Self-pay | Admitting: Family Medicine

## 2020-07-01 ENCOUNTER — Ambulatory Visit: Payer: BC Managed Care – PPO | Admitting: Family Medicine

## 2020-07-01 NOTE — Telephone Encounter (Signed)
Attempt made to reach patient. Unable to leave voice message.  She missed her appointment today.  She has had > 3 no-shows in 6 months.  Is liable to dismissal if she no shows again.  Certified letter mail to her home to notify her of this.

## 2020-07-20 ENCOUNTER — Other Ambulatory Visit (HOSPITAL_COMMUNITY)
Admission: RE | Admit: 2020-07-20 | Discharge: 2020-07-20 | Disposition: A | Discharge status: Home / Self Care | Payer: BC Managed Care – PPO | Source: Ambulatory Visit | Attending: Family Medicine | Admitting: Family Medicine

## 2020-07-20 ENCOUNTER — Encounter: Payer: Self-pay | Admitting: Family Medicine

## 2020-07-20 ENCOUNTER — Other Ambulatory Visit: Payer: Self-pay

## 2020-07-20 ENCOUNTER — Ambulatory Visit: Payer: BC Managed Care – PPO | Admitting: Family Medicine

## 2020-07-20 VITALS — BP 112/70 | HR 74 | Ht 68.0 in | Wt 285.0 lb

## 2020-07-20 DIAGNOSIS — Z3201 Encounter for pregnancy test, result positive: Secondary | ICD-10-CM | POA: Insufficient documentation

## 2020-07-20 DIAGNOSIS — Z3490 Encounter for supervision of normal pregnancy, unspecified, unspecified trimester: Secondary | ICD-10-CM | POA: Diagnosis not present

## 2020-07-20 DIAGNOSIS — Z3200 Encounter for pregnancy test, result unknown: Secondary | ICD-10-CM | POA: Diagnosis not present

## 2020-07-20 DIAGNOSIS — Z3491 Encounter for supervision of normal pregnancy, unspecified, first trimester: Secondary | ICD-10-CM | POA: Insufficient documentation

## 2020-07-20 LAB — POCT URINE PREGNANCY: Preg Test, Ur: POSITIVE — AB

## 2020-07-20 NOTE — Assessment & Plan Note (Addendum)
Positive pregnancy test today. Definite LMP of 5/16 and with history of regular periods and without recent hormonal contraception, thus no need for dating ultrasound. This is a desired pregnancy. Pre-pregnancy weight reported to be around 280 lbs. Discussed desired weight gain this pregnancy given BMI. Initial routine OB labs collected today. Initial OB visit scheduled with me on 6/29. Discussed importance of taking prenatal vitamins daily. Patient has these at home, thus did not order. No problems with nausea at this time. Has had one prior pregnancy that resulted in an uncomplicated vaginal delivery.  -F/u at initial OB visit later this month

## 2020-07-20 NOTE — Patient Instructions (Signed)
It was wonderful to see you today.  Please bring ALL of your medications with you to every visit.   Today we did a pregnancy test which was positive. We are doing other routine testing for pregnancy. You are scheduled to have your initial OB appointment with me on 6/29 at 3:45PM. Please arrive at least 15 minutes early. We will go over the lab results at that time.  Take prenatal vitamins daily. If you have any problems with nausea please let me know.   Thank you for choosing [facility].   Please call [phone] with any questions about today's appointment.  Please be sure to schedule follow up at the front  desk before you leave today.   Sabino Dick, DO PGY-1 Family Medicine

## 2020-07-20 NOTE — Progress Notes (Addendum)
    SUBJECTIVE:   CHIEF COMPLAINT / HPI:   Encounter for Pregnancy Test Patient presents today for confirmation of pregnancy. Took a home pregnancy test that was positive. LMP 5/16 that was definite. Has regular periods. No recent use of hormonal contraceptives. Sexually active with 1 female partner. No abnormal vaginal discharge. No concern for STI's. Denies nausea. Has had one prior pregnancy that was uncomplicated. Has prenatal vitamins at home.   PERTINENT  PMH / PSH:  Past Medical History:  Diagnosis Date   Encounter for supervision of normal first pregnancy in second trimester 09/02/2015    Clinic [facility]) Prenatal Labs Dating  LMP Blood type: O/POS/-- (03/31 1427)  Genetic Screen 1 Screen:    AFP:     Quad: Normal    NIPS: Antibody:NEG (03/31 1427) Anatomic [place]  Repeat U/S normal  Rubella: <0.90 (03/31 1427) GTT Early: 85             Third trimester:  RPR: NON REAC (08/25 1336)  Flu vaccine  Declined  HBsAg: NEGATIVE (03/31 1427)  TDaP vaccine  Given                          Rhogam: N/A HIV: NONREACTIVE (08/25 1336)  Baby Food  Breast and Bottle                                     GBS: (For PCN allergy, check sensitivities)  Positive  Contraception  Undecided  Pap: Circumcision  Yes, [facility]   Pediatrician   Support Person       NSVD (normal spontaneous vaginal delivery) 12/25/2015   Positive GBS test 12/09/2015   Rubella non-immune status, antepartum 12/01/2015   Needs MMR post partum    OBJECTIVE:   BP 112/70   Pulse 74   Ht $R'5\' 8"'Ub$  (1.727 m)   Wt 285 lb (129.3 kg)   LMP 06/20/2020   SpO2 100%   BMI 43.33 kg/m    General: NAD, pleasant, able to participate in exam Respiratory: Breathing comfortably on room air, in no respiratory distress GU: Exam chaperoned by Cherrie Distance CMA. External vulva normal without lesions. Vagina with normal color and without lesions. Normal scant discharge seen at vaginal vault.  Abdomen: Obese abdomen Neuro: alert, speaking clearly, no obvious focal  deficits Psych: Normal affect and mood  ASSESSMENT/PLAN:   Positive pregnancy test Positive pregnancy test today. Definite LMP of 5/16 and with history of regular periods and without recent hormonal contraception, thus no need for dating ultrasound. This is a desired pregnancy. Pre-pregnancy weight reported to be around 280 lbs. Discussed desired weight gain this pregnancy given BMI. Initial routine OB labs collected today. Initial OB visit scheduled with me on 6/29. Discussed importance of taking prenatal vitamins daily. Patient has these at home, thus did not order. No problems with nausea at this time. Has had one prior pregnancy that resulted in an uncomplicated vaginal delivery.  -F/u at initial OB visit later this month     Sharion Settler, [place]

## 2020-07-21 LAB — CERVICOVAGINAL ANCILLARY ONLY
Chlamydia: NEGATIVE
Comment: NEGATIVE
Comment: NEGATIVE
Comment: NORMAL
Neisseria Gonorrhea: NEGATIVE
Trichomonas: NEGATIVE

## 2020-07-21 NOTE — Progress Notes (Signed)
Patient Name: Armanda Forand Date of Birth: [birth year] [facility] Initial Prenatal Visit  Tahisha Hakim is a 25 y.o. year old G2P1001 at Unknown who presents for her initial prenatal visit. Pregnancy is planned She reports fatigue and nausea- works night shift.  She is taking a prenatal vitamin.  She denies pelvic pain or vaginal bleeding.   Pregnancy Dating: The patient is dated by LMP.  LMP: 5/16 Period is certain:  Yes.  Periods were regular:  Yes.  LMP was a typical period:  Yes.  Using hormonal contraception in 3 months prior to conception: No  Lab Review: Blood type: O Rh Status: + Antibody screen: Negative HIV: Negative RPR: Negative Hemoglobin electrophoresis reviewed: Yes, normal.  Results of OB urine culture are: Positive for Enterococcus faecalis only resistant to Tetracycline  Rubella: Not immune Hep C Ab: Negative Varicella status is Immune  PMH: Reviewed and as detailed below: HTN: No  Gestational Hypertension/preeclampsia: No  Type 1 or 2 Diabetes: No  Depression:  No  Seizure disorder:  No VTE: No ,  History of STI No,  Abnormal Pap smear:  No, Genital herpes simplex:  No   PSH: Gynecologic Surgery:  no Surgical history reviewed, notable for: None  Obstetric History: Obstetric history tab updated and reviewed.  Summary of prior pregnancies: 12/23/2015: Healthy female delivered at [redacted]w[redacted]d, vaginal delivery, weight 7lb 1.2 oz. Uncomplicated prenatal course.  Cesarean delivery: No  Gestational Diabetes:  No Hypertension in pregnancy: No History of preterm birth: No History of LGA/SGA infant:  No History of shoulder dystocia: No Indications for referral were reviewed, and the patient has no obstetric indications for referral to High Risk OB Clinic at this time.    Social History: Partner's name: Rosezena Sensor   Tobacco use: No Alcohol use:  No Other substance use:  No  Current Medications:  Prenatal vitamins.  Reviewed and  appropriate in pregnancy.   Genetic and Infection Screen: Flow Sheet Updated Yes  Prenatal Exam: Gen: Well nourished, well developed.  No distress.  Vitals noted. HEENT: Normocephalic, atraumatic.  Neck supple without cervical lymphadenopathy, thyromegaly or thyroid nodules.  Fair dentition. CV: RRR no murmur, gallops or rubs Lungs: CTA B.  Normal respiratory effort without wheezes or rales. Abd: soft, NTND. +BS.  Uterus not appreciated above pelvis. GU: Normal external female genitalia without lesions.  Nl vaginal, well rugated without lesions. No vaginal discharge.  Bimanual exam: No adnexal mass or TTP. No CMT.  Uterus size below level of umbilicus Ext: No clubbing, cyanosis or edema. Psych: Normal grooming and dress.  Not depressed or anxious appearing.  Normal thought content and process without flight of ideas or looseness of associations  Fetal heart tones: Too early  Assessment/Plan:  Adean Milosevic is a 25 y.o. G1P1001 at Unknown who presents to initiate prenatal care. She is doing well.  Current pregnancy issues include Nausea and fatigue.  Routine prenatal care: As dating is not reliable, a dating ultrasound has been ordered. Dating tab updated. Pre-pregnancy weight updated. Expected weight gain this pregnancy is 11-20 pounds  Prenatal labs reviewed, notable for rubella non-immune. Indications for referral to HROB were reviewed and the patient does not meet criteria for referral.  Medication list reviewed and updated.  Recommended patient see a dentist for regular care.  Bleeding and pain precautions reviewed. Importance of prenatal vitamins reviewed.  Genetic screening offered. Patient opted for: quad screen at 16-20 weeks (also interested in cell free DNA) The patient does have an indication  for aspirin therapy beginning at 12-16 weeks. Aspirin was  recommended today- to start after 12 weeks.  The patient will not be age 41 or over at time of delivery. Referral to  genetic counseling was not offered today.  The patient has the following risk factors for preexisting diabetes: BMI > 25 and high risk ethnicity (Latino, [place] American, Native American, Mal[place] Islander, Asian Naval architect) . An early 1 hour glucose tolerance test was ordered. Pregnancy Medical Home and PHQ-9 forms completed, problems noted: Yes  2. Pregnancy issues include the following which were addressed today:  Rubella non-immune status: will need vaccination post-partum E. Faecalis UTI- Will treat with Amoxicillin 1g BID x5 days. Scheduled for Lab appointment 7/11 to ensure resolution.  Obesity: BMI 43. Discussed expected weight gain in pregnancy. 1-hour GTT today which is 110.  Nausea: Diclegis prescribed. Recommendations given.   Fatigue: Works night shifts, expects it is related to this.    Follow up 4 weeks for next prenatal visit.

## 2020-07-22 LAB — OBSTETRIC PANEL, INCLUDING HIV
Antibody Screen: NEGATIVE
Basophils Absolute: 0 10*3/uL (ref 0.0–0.2)
Basos: 0 %
EOS (ABSOLUTE): 0 10*3/uL (ref 0.0–0.4)
Eos: 1 %
HIV Screen 4th Generation wRfx: NONREACTIVE
Hematocrit: 34.3 % (ref 34.0–46.6)
Hemoglobin: 11.4 g/dL (ref 11.1–15.9)
Hepatitis B Surface Ag: NEGATIVE
Immature Grans (Abs): 0 10*3/uL (ref 0.0–0.1)
Immature Granulocytes: 0 %
Lymphocytes Absolute: 2.8 10*3/uL (ref 0.7–3.1)
Lymphs: 34 %
MCH: 27.1 pg (ref 26.6–33.0)
MCHC: 33.2 g/dL (ref 31.5–35.7)
MCV: 82 fL (ref 79–97)
Monocytes Absolute: 0.8 10*3/uL (ref 0.1–0.9)
Monocytes: 9 %
Neutrophils Absolute: 4.8 10*3/uL (ref 1.4–7.0)
Neutrophils: 56 %
Platelets: 203 10*3/uL (ref 150–450)
RBC: 4.21 x10E6/uL (ref 3.77–5.28)
RDW: 12.5 % (ref 11.7–15.4)
RPR Ser Ql: NONREACTIVE
Rh Factor: POSITIVE
Rubella Antibodies, IGG: <0.9 index — ABNORMAL LOW (ref >0.99)
WBC: 8.5 10*3/uL (ref 3.4–10.8)

## 2020-07-22 LAB — HGB FRACTIONATION CASCADE
Hgb A2: 2.5 % (ref 1.8–3.2)
Hgb A: 97.5 % (ref 96.4–98.8)
Hgb F: 0 % (ref 0.0–2.0)
Hgb S: 0 %

## 2020-07-22 LAB — HCV INTERPRETATION

## 2020-07-22 LAB — HCV AB W REFLEX TO QUANT PCR: HCV Ab: <0.1 s/co ratio (ref 0.0–0.9)

## 2020-07-25 LAB — URINE CULTURE, OB REFLEX

## 2020-07-25 LAB — CULTURE, OB URINE

## 2020-07-26 ENCOUNTER — Telehealth: Payer: Self-pay

## 2020-07-26 NOTE — Telephone Encounter (Signed)
Returned phone call to patient. Scheduled for tomorrow afternoon with Dr. Mauri Reading.   Veronda Prude, RN

## 2020-07-26 NOTE — Telephone Encounter (Signed)
Patient calls nurse line regarding medication for yeast infection. Patient reports vaginal irritation, itching and small amount of white discharge.   Patient reports history of yeast infections with last pregnancy. Patient reports that she is approx. [redacted] weeks pregnant. Patient had negative STI screening on 6/15.  Patient would like to know which OTC medications are safe in pregnancy or if she needs prescription  Please advise.   Veronda Prude, RN

## 2020-07-26 NOTE — Progress Notes (Signed)
  Subjective    Tina Keller is a 25 y.o. female currently pregnant G2P1 who complains of white and thick vaginal discharge for 2 days. Denies abnormal vaginal bleeding, significant pelvic pain or fever. No UTI symptoms. Sexually active, does not use condoms, sexually active with 1 female partner.  Denies history of known exposure to STD or symptoms in partner. Not interested in STD screening.   LMP 06/20/20. Currently [redacted] weeks pregnant. First Prenatal on 08/03/20  No abdominal pain, nausea, or vomiting.  No fevers or chills.     Review of Systems  See HPI above for review of systems.    Objective:    BP 104/68   Pulse 74   Ht 5\' 8"  (1.727 m)   Wt 288 lb (130.6 kg)   LMP 06/20/2020   SpO2 98%   BMI 43.79 kg/m   Gen:  She appears well, afebrile.  Resp: Speaking in full sentences, breathing comfortably on room air GYN:  External genitalia within normal limits.  Vaginal mucosa pink, moist, normal rugae.  White thick discharge throughout vault. Anterior cervix. Nonfriable cervix without lesions, thick whtie discharge, no bleeding noted on speculum exam.  Exam Chaperoned by: 06/22/2020   Assessment & Plan:   Vaginal discharge during pregnancy in first trimester Wet prep notable for yeast and BV. She wasn't having symptoms of BV thus will focus treatment on candidal infection. Will recommend treatment of yeast with OTC Monistat 7-day course.  Recommended reaching out if continues to have abnormal discharge and I will call in Metronidazole x 7 days. Patient was amendable to this plan.  Return precautions including abdominal pain, fever, chills, nausea, or vomiting given.    Orders Placed This Encounter  Procedures   POCT Wet Prep [facility] [place])     [place], DO [facility], [facility] 07/27/2020 4:22 PM

## 2020-07-27 ENCOUNTER — Other Ambulatory Visit: Payer: Self-pay

## 2020-07-27 ENCOUNTER — Encounter: Payer: Self-pay | Admitting: Family Medicine

## 2020-07-27 ENCOUNTER — Ambulatory Visit: Payer: BC Managed Care – PPO | Admitting: Family Medicine

## 2020-07-27 VITALS — BP 104/68 | HR 74 | Ht 68.0 in | Wt 288.0 lb

## 2020-07-27 DIAGNOSIS — O26891 Other specified pregnancy related conditions, first trimester: Secondary | ICD-10-CM | POA: Insufficient documentation

## 2020-07-27 DIAGNOSIS — N898 Other specified noninflammatory disorders of vagina: Secondary | ICD-10-CM | POA: Insufficient documentation

## 2020-07-27 LAB — POCT WET PREP (WET MOUNT)
Clue Cells Wet Prep Whiff POC: POSITIVE
Trichomonas Wet Prep HPF POC: ABSENT

## 2020-07-27 NOTE — Assessment & Plan Note (Addendum)
Wet prep notable for yeast and BV. She wasn't having symptoms of BV thus will focus treatment on candidal infection. Will recommend treatment of yeast with OTC Monistat 7-day course.  Recommended reaching out if continues to have abnormal discharge and I will call in Metronidazole x 7 days. Patient was amendable to this plan.  Return precautions including abdominal pain, fever, chills, nausea, or vomiting given.

## 2020-07-27 NOTE — Patient Instructions (Signed)
I will call you with the results and recommended treatments.

## 2020-08-01 ENCOUNTER — Other Ambulatory Visit: Payer: Self-pay | Admitting: Family Medicine

## 2020-08-03 ENCOUNTER — Ambulatory Visit: Payer: BC Managed Care – PPO | Admitting: Family Medicine

## 2020-08-03 ENCOUNTER — Encounter: Payer: Self-pay | Admitting: Family Medicine

## 2020-08-03 ENCOUNTER — Other Ambulatory Visit: Payer: Self-pay

## 2020-08-03 DIAGNOSIS — Z3490 Encounter for supervision of normal pregnancy, unspecified, unspecified trimester: Secondary | ICD-10-CM | POA: Diagnosis not present

## 2020-08-03 DIAGNOSIS — O403XX0 Polyhydramnios, third trimester, not applicable or unspecified: Secondary | ICD-10-CM | POA: Diagnosis not present

## 2020-08-03 DIAGNOSIS — Z3483 Encounter for supervision of other normal pregnancy, third trimester: Secondary | ICD-10-CM | POA: Diagnosis not present

## 2020-08-03 DIAGNOSIS — A749 Chlamydial infection, unspecified: Secondary | ICD-10-CM | POA: Diagnosis not present

## 2020-08-03 DIAGNOSIS — Z3481 Encounter for supervision of other normal pregnancy, first trimester: Secondary | ICD-10-CM

## 2020-08-03 LAB — POCT 1 HR PRENATAL GLUCOSE: Glucose 1 Hr Prenatal, POC: 110 mg/dL

## 2020-08-03 MED ORDER — DOXYLAMINE-PYRIDOXINE 10-10 MG PO TBEC: 1.0000 | DELAYED_RELEASE_TABLET | Freq: Every day | ORAL | 0 refills | Status: DC

## 2020-08-03 MED ORDER — AMOXICILLIN 500 MG PO CAPS: 1000.0000 mg | ORAL_CAPSULE | Freq: Two times a day (BID) | ORAL | 0 refills | Status: AC

## 2020-08-03 NOTE — Patient Instructions (Addendum)
It was wonderful to see you today.  Please bring ALL of your medications with you to every visit.   Today we talked about:  I sent in a medication for your nausea called doxylamine-pyridoxine for you to take daily.  Additionally, like we talked about eating small meals and snacks throughout the day and eating crackers before waking up in the morning can help.  If you are ever unable to keep any fluids or food down, please go to the Maternal Assessment Unit at the Hospital (shown below).   I have also sent a medication to treat your urinary tract infection.  You have an appointment at the lab for repeat urine study to determine whether or not you have cleared this infection.  If you experience any vaginal bleeding, leakage of fluids, don't feel your baby moving as much, or start to have contractions less than 5 minutes apart please go directly to the Maternal Assessment Unit at [facility] for evaluation.  Maternity and women's care services located on the [place] side of [place]. [facility] (Entrance C off [address]).  [address] Entr[address] [place],  [place]  [address]      Thank you for choosing [facility].   Please call [phone] with any questions about today's appointment.  Please be sure to schedule follow up at the front  desk before you leave today.   Sabino Dick, DO PGY-1 Family Medicine

## 2020-08-10 ENCOUNTER — Ambulatory Visit: Payer: BC Managed Care – PPO

## 2020-08-11 ENCOUNTER — Ambulatory Visit
Admission: RE | Admit: 2020-08-11 | Discharge: 2020-08-11 | Disposition: A | Discharge status: Home / Self Care | Payer: BC Managed Care – PPO | Source: Ambulatory Visit | Attending: Family Medicine | Admitting: Family Medicine

## 2020-08-11 ENCOUNTER — Other Ambulatory Visit: Payer: Self-pay

## 2020-08-11 DIAGNOSIS — Z3A01 Less than 8 weeks gestation of pregnancy: Secondary | ICD-10-CM | POA: Diagnosis not present

## 2020-08-11 DIAGNOSIS — Z3490 Encounter for supervision of normal pregnancy, unspecified, unspecified trimester: Secondary | ICD-10-CM

## 2020-08-11 DIAGNOSIS — O26841 Uterine size-date discrepancy, first trimester: Secondary | ICD-10-CM | POA: Diagnosis not present

## 2020-08-15 ENCOUNTER — Other Ambulatory Visit: Payer: BC Managed Care – PPO

## 2020-08-15 ENCOUNTER — Other Ambulatory Visit: Payer: Self-pay

## 2020-08-15 DIAGNOSIS — Z3481 Encounter for supervision of other normal pregnancy, first trimester: Secondary | ICD-10-CM

## 2020-08-17 LAB — CULTURE, OB URINE

## 2020-08-17 LAB — URINE CULTURE, OB REFLEX

## 2020-08-31 NOTE — Progress Notes (Signed)
  Patient Name: Tina Keller Date of Birth: [birth year] [facility] Prenatal Visit  Tina Keller is a 25 y.o. G2P1001 at [redacted]w[redacted]d here for routine follow up. She is dated by LMP.  She reports  weight loss due to decreased appetite. Has been eating more normally for the last 3 days. Interminttent vomiting, once about every other day .  Reports seeing some vaginal bleeding when wiping after urinating (there is pink on toilet paper). No dysuria or blood in her urine. This only happens when she is at work (works as [employer]), does not happen at home.  See flow sheet for details.  Vitals:   09/02/20 1036  BP: 112/72  Pulse: 90     A/P: Pregnancy at [redacted]w[redacted]d.  Doing well.    Routine Prenatal Care:  Dating reviewed, dating tab is correct Fetal heart tones not assessed today given gestational age. Influenza vaccine not administered as not influenza season.   COVID vaccination was discussed and has received two vaccines. Declines booster at this time.  The patient has the following indication for screening preexisting diabetes: Previously done at initial OB appointment and normal. Anatomy ultrasound to be scheduled at 18-20 weeks. Not ordered today given early gestational age. Patient is interested in genetic screening. Interested in cell-free DNA testing which can be ordered at next visit with me at the end of August.  Pregnancy education including expected weight gain in pregnancy, OTC medication use, continued use of prenatal vitamin, smoking cessation if applicable, and nutrition in pregnancy.   Bleeding and pain precautions reviewed.  2. Pregnancy issues include the following and were addressed as appropriate today:  Rubella non-immune: will need vaccination post-partum. Obesity with BMI 41. 10 lb weight loss since last visit 65-month ago. This is concerning. Reassuringly she reports back to normal appetite the last 3 days. Previously did not have much of an appetite. She reports nausea  is improved though she is still vomiting about every other day. She is taking Diclegis daily. We discussed other methods to help such as eating small snacks throughout the day and crackers upon awakening. Instructed to present to MAU if unable to keep down fluids/food. Scheduled for weight check with Dr. Idalia Needle to ensure weight is not continuing to drop. Please ask about appetite and N/V.   Previous E. Faecalis UTI with negative test of cure after completion of abx.  She reports having some "pink" discoloration on toilet paper when wiping after voiding. Unclear if urethral or vaginal origin though does not happen regularly, she is not soaking through underwear, and only seems to happen with wiping after voiding at work only. Will check urine culture given she previously had an asymptomatic UTI. Will treat as necessary. Return precautions to MAU provided.  Cell-free DNA at follow up with me at end of August.  Problem list  and pregnancy box updated: Yes.   Follow up 4 weeks.

## 2020-09-02 ENCOUNTER — Ambulatory Visit: Payer: BC Managed Care – PPO | Admitting: Family Medicine

## 2020-09-02 ENCOUNTER — Other Ambulatory Visit: Payer: Self-pay

## 2020-09-02 VITALS — BP 112/72 | HR 90 | Wt 274.4 lb

## 2020-09-02 DIAGNOSIS — O2610 Low weight gain in pregnancy, unspecified trimester: Secondary | ICD-10-CM | POA: Insufficient documentation

## 2020-09-02 DIAGNOSIS — Z3481 Encounter for supervision of other normal pregnancy, first trimester: Secondary | ICD-10-CM

## 2020-09-02 DIAGNOSIS — R319 Hematuria, unspecified: Secondary | ICD-10-CM

## 2020-09-02 DIAGNOSIS — O2611 Low weight gain in pregnancy, first trimester: Secondary | ICD-10-CM

## 2020-09-02 HISTORY — DX: Low weight gain in pregnancy, unspecified trimester: O26.10

## 2020-09-02 NOTE — Patient Instructions (Signed)
It was wonderful to see you today.  Please bring ALL of your medications with you to every visit.   Today we talked about:  -Please return for your appointment in 2 weeks for a weight check  -We are checking for UTI's today -At your next visit with me we can do blood work for genetic screening and listen for baby's heart.  -Eat small snacks throughout the day, not related stomach get empty.  I would suggest keeping crackers by your nightstand and eating them before getting up in the morning. -If at any point you cannot keep down fluid or food, please go to the MAU at [facility].  If you experience any vaginal bleeding, leakage of fluids, don't feel your baby moving as much, or start to have contractions less than 5 minutes apart please go directly to the Maternal Assessment Unit at [facility] for evaluation.  Maternity and women's care services located on the [place] side of [facility] [place]. [facility] (Entrance C off [address]).  [address] Entrance C [place],  [place]  [address]       Thank you for choosing [facility].   Please call [phone] with any questions about today's appointment.  Please be sure to schedule follow up at the front  desk before you leave today.   Sabino Dick, DO PGY-2 Family Medicine

## 2020-09-16 ENCOUNTER — Other Ambulatory Visit: Payer: Self-pay

## 2020-09-16 ENCOUNTER — Ambulatory Visit (INDEPENDENT_AMBULATORY_CARE_PROVIDER_SITE_OTHER): Payer: BC Managed Care – PPO | Admitting: Family Medicine

## 2020-09-16 VITALS — BP 120/75 | HR 80 | Temp 98.3°F | Ht 67.5 in | Wt 277.2 lb

## 2020-09-16 DIAGNOSIS — O2611 Low weight gain in pregnancy, first trimester: Secondary | ICD-10-CM

## 2020-09-18 NOTE — Progress Notes (Signed)
    SUBJECTIVE:   CHIEF COMPLAINT / HPI:   Tina Keller is a 25 yo who presented for a weight check. She was last seen on 7/29 and presented with a 10lb weight loss since the month prior due to nausea, vomiting and lack of appetite. Today she weighed 277 lb 3.2 oz ,up 3 pounds from last visit.  When I went to see patient she was no longer in the room. Called patient and she stated she had some where to be and wasn't able to wait since I was running behind. States she was feeling better and denied any concerns or complaints.    OBJECTIVE:   BP 120/75   Pulse 80   Temp 98.3 F (36.8 C)   Ht 5' 7.5" (1.715 m)   Wt 277 lb 3.2 oz (125.7 kg)   LMP 06/20/2020 (Exact Date)   SpO2 99%   BMI 42.77 kg/m     ASSESSMENT/PLAN:   No problem-specific Assessment & Plan notes found for this encounter.  Follow up with PCP on 8/30 at 1:30p   Cora Collum, DO [facility]

## 2020-10-02 NOTE — Patient Instructions (Addendum)
It was wonderful to see you today.  Please bring ALL of your medications with you to every visit.   Today we talked about:  -We are doing a genetic screen. I will call you with the results.  -Your baby sounded great!  If you experience any vaginal bleeding, leakage of fluids, don't feel your baby moving as much, or start to have contractions less than 5 minutes apart please go directly to the Maternal Assessment Unit at [facility] for evaluation.  Maternity and women's care services located on the [place] side of [facility] [place]. [facility] (Entrance C off [address]  [address] Entr[address] [place],  [place]  [address]     Thank you for choosing [facility].   Please call [phone] with any questions about today's appointment.  Please be sure to schedule follow up at the front  desk before you leave today.   Sabino Dick, DO PGY-2 Family Medicine

## 2020-10-02 NOTE — Progress Notes (Signed)
am Patient Name: Tina Keller Date of Birth: May 21, 1995 [facility] Prenatal Visit  Tina Keller is a 25 y.o. G2P1001 at [redacted]w[redacted]d here for routine follow up. She is dated by LMP.  She reports no complaints.  She denies vaginal bleeding.  See flow sheet for details.  Vitals:   10/04/20 1336  BP: 110/75  Pulse: 77     A/P: Pregnancy at [redacted]w[redacted]d.  Doing well.    Routine Prenatal Care:  Dating reviewed, dating tab is correct Fetal heart tones Appropriate Influenza vaccine not administered as not influenza season.   COVID vaccination was discussed.  Patient has received 2 vaccines but is not boosted.  She declines booster today The patient has the following indication for screening preexisting diabetes: BMI > 25 and high risk ethnicity (Latino, [place] American, Native American, [place] Islander, Asian Naval architect) . Previously done and normal.  Anatomy ultrasound ordered to be scheduled at 18-20 weeks. Patient is interested in genetic screening. As she is past 13 weeks and 6 days, a  cell-free DNA was done today.   Pregnancy education including expected weight gain in pregnancy, OTC medication use, continued use of prenatal vitamin, smoking cessation if applicable, and nutrition in pregnancy.   Bleeding and pain precautions reviewed.  2. Pregnancy issues include the following and were addressed as appropriate today:  Cell-Free DNA ordered today. Patient would NOT like to know the sx of the baby but states that her sister would like to know this information.  Anatomy U/S scheduled today. Obesity with BMI 42. Down 1 lb since last visit, weight is stable.  Nausea and vomiting has resolved. She has normal appetite.  Scheduled for 9/22 at 9:30AM in [facility]. Problem list  and pregnancy box updated: Yes.

## 2020-10-04 ENCOUNTER — Ambulatory Visit (INDEPENDENT_AMBULATORY_CARE_PROVIDER_SITE_OTHER): Payer: BC Managed Care – PPO | Admitting: Family Medicine

## 2020-10-04 ENCOUNTER — Other Ambulatory Visit: Payer: Self-pay

## 2020-10-04 VITALS — BP 110/75 | HR 77 | Wt 276.4 lb

## 2020-10-04 DIAGNOSIS — Z3482 Encounter for supervision of other normal pregnancy, second trimester: Secondary | ICD-10-CM

## 2020-10-19 ENCOUNTER — Encounter: Payer: Self-pay | Admitting: Family Medicine

## 2020-10-19 ENCOUNTER — Telehealth: Payer: Self-pay

## 2020-10-19 ENCOUNTER — Ambulatory Visit (INDEPENDENT_AMBULATORY_CARE_PROVIDER_SITE_OTHER): Payer: BC Managed Care – PPO | Admitting: Family Medicine

## 2020-10-19 ENCOUNTER — Other Ambulatory Visit (HOSPITAL_COMMUNITY)
Admission: RE | Admit: 2020-10-19 | Discharge: 2020-10-19 | Disposition: A | Discharge status: Home / Self Care | Payer: BC Managed Care – PPO | Source: Ambulatory Visit | Attending: Family Medicine | Admitting: Family Medicine

## 2020-10-19 ENCOUNTER — Other Ambulatory Visit: Payer: Self-pay

## 2020-10-19 VITALS — BP 130/65 | HR 108 | Wt 281.8 lb

## 2020-10-19 DIAGNOSIS — N898 Other specified noninflammatory disorders of vagina: Secondary | ICD-10-CM

## 2020-10-19 DIAGNOSIS — O26892 Other specified pregnancy related conditions, second trimester: Secondary | ICD-10-CM | POA: Diagnosis not present

## 2020-10-19 LAB — POCT WET PREP (WET MOUNT)
Clue Cells Wet Prep Whiff POC: NEGATIVE
Trichomonas Wet Prep HPF POC: ABSENT

## 2020-10-19 MED ORDER — MICONAZOLE NITRATE 2 % VA CREA: 1.0000 | TOPICAL_CREAM | Freq: Every day | VAGINAL | 0 refills | Status: AC

## 2020-10-19 NOTE — Telephone Encounter (Signed)
Patient calls nurse line reporting symptoms of yeast infection. States that she has been using OTC monistat treatment with little relief in symptoms. Reports that today she noticed that left labia "is very swollen"  Scheduled same day appointment with Dr. Neita Garnet for further evaluation of concern.   Veronda Prude, RN

## 2020-10-19 NOTE — Patient Instructions (Signed)
Please continue to treat with topical antifungal. We will send a cream to apply to your labia (outside) to help with irritation.   Vaginal Yeast Infection, Adult Vaginal yeast infection is a condition that causes vaginal discharge as well as soreness, swelling, and redness (inflammation) of the vagina. This is a common condition. Some women get this infection frequently. What are the causes? This condition is caused by a change in the normal balance of the yeast (Candida) and normal bacteria that live in the vagina. This change causes an overgrowth of yeast, which causes the inflammation. What increases the risk? The condition is more likely to develop in women who: Take antibiotic medicines. Have diabetes. Take birth control pills. Are pregnant. Douche often. Have a weak body defense system (immune system). Have been taking steroid medicines for a long time. Frequently wear tight clothing. What are the signs or symptoms? Symptoms of this condition include: White, thick, creamy vaginal discharge. Swelling, itching, redness, and irritation of the vagina. The lips of the vagina (labia) may be affected as well. Pain or a burning feeling while urinating. Pain during sex. How is this diagnosed? This condition is diagnosed based on: Your medical history. A physical exam. A pelvic exam. Your health care provider will examine a sample of your vaginal discharge under a microscope. Your health care provider may send this sample for testing to confirm the diagnosis. How is this treated? This condition is treated with medicine. Medicines may be over-the-counter or prescription. You may be told to use one or more of the following: Medicine that is taken by mouth (orally). Medicine that is applied as a cream (topically). Medicine that is inserted directly into the vagina (suppository). Follow these instructions at home: Take or apply over-the-counter and prescription medicines only as told by  your health care provider. Do not use tampons until your health care provider approves. Do not have sex until your infection has cleared. Sex can prolong or worsen your symptoms of infection. Ask your health care provider when it is safe to resume sexual activity. Keep all follow-up visits. This is important. How is this prevented?  Do not wear tight clothes, such as pantyhose or tight pants. Wear breathable cotton underwear. Do not use douches, perfumed soap, creams, or powders. Wipe from front to back after using the toilet. If you have diabetes, keep your blood sugar levels under control. Ask your health care provider for other ways to prevent yeast infections. Contact a health care provider if: You have a fever. Your symptoms go away and then return. Your symptoms do not get better with treatment. Your symptoms get worse. You have new symptoms. You develop blisters in or around your vagina. You have blood coming from your vagina and it is not your menstrual period. You develop pain in your abdomen. Summary Vaginal yeast infection is a condition that causes discharge as well as soreness, swelling, and redness (inflammation) of the vagina. This condition is treated with medicine. Medicines may be over-the-counter or prescription. Take or apply over-the-counter and prescription medicines only as told by your health care provider. Do not douche. Resume sexual activity or use of tampons as instructed by your health care provider. Contact a health care provider if your symptoms do not get better with treatment or your symptoms go away and then return. This information is not intended to replace advice given to you by your health care provider. Make sure you discuss any questions you have with your health care provider. Document Revised:  04/11/2020 Document Reviewed: 04/11/2020 Elsevier Patient Education  2022 [employer].

## 2020-10-19 NOTE — Progress Notes (Signed)
    SUBJECTIVE:   CHIEF COMPLAINT / HPI: Concern for yeast infection  Patient presents with white thick discharge for 3 days.  She reports that she is having vulvar irritation and has been treating with over-the-counter Monistat topical therapy but states that itching and irritation have worsened over the past few days.  Patient states that she has been sexually active.  She denies any concern for sexually transmitted infection at this time.  She denies any vaginal bleeding.  PERTINENT  PMH / PSH:  [redacted] week gestation  OBJECTIVE:   BP 130/65   Pulse (!) 108   Wt 281 lb 12.8 oz (127.8 kg)   LMP 06/20/2020 (Exact Date)   BMI 43.48 kg/m   Genitalia:  Normal introitus for age, no external lesions, copious white, thick vaginal discharge, mucosa pink and moist, no vaginal or cervical lesions, no vaginal atrophy, no friaility or hemorrhage, gravid uterus size and position, no adnexal masses or tenderness, vulva irritation present without lesions  ASSESSMENT/PLAN:   Vaginal discharge during pregnancy in second trimester Vaginal discharge and symptoms consistent with vaginal candidiasis Prescribed topical miconazole to help with vulvar irritation  Patient to continue treatment for 7 days  Gonorrhea and chlamydia testing completed today      Ronnald Ramp, MD [facility]

## 2020-10-21 LAB — CERVICOVAGINAL ANCILLARY ONLY
Chlamydia: NEGATIVE
Comment: NEGATIVE
Comment: NORMAL
Neisseria Gonorrhea: NEGATIVE

## 2020-10-22 DIAGNOSIS — N898 Other specified noninflammatory disorders of vagina: Secondary | ICD-10-CM | POA: Insufficient documentation

## 2020-10-22 DIAGNOSIS — O26892 Other specified pregnancy related conditions, second trimester: Secondary | ICD-10-CM | POA: Insufficient documentation

## 2020-10-22 NOTE — Assessment & Plan Note (Addendum)
Vaginal discharge and symptoms consistent with vaginal candidiasis Prescribed topical miconazole to help with vulvar irritation  Patient to continue treatment for 7 days  Gonorrhea and chlamydia testing completed today

## 2020-10-26 ENCOUNTER — Encounter: Payer: Self-pay | Admitting: Family Medicine

## 2020-10-27 ENCOUNTER — Other Ambulatory Visit: Payer: Self-pay

## 2020-10-27 ENCOUNTER — Ambulatory Visit (INDEPENDENT_AMBULATORY_CARE_PROVIDER_SITE_OTHER): Payer: BC Managed Care – PPO | Admitting: Family Medicine

## 2020-10-27 VITALS — BP 122/74 | HR 87 | Wt 280.6 lb

## 2020-10-27 DIAGNOSIS — Z3482 Encounter for supervision of other normal pregnancy, second trimester: Secondary | ICD-10-CM

## 2020-10-27 NOTE — Progress Notes (Signed)
[facility] Visit  Tina Keller is a 25 y.o. G2P1001 at [redacted]w[redacted]d (via LMP) who presents to [facility] for routine follow up. Seen today along with medical student Cristobal Goldmann. Prenatal course, history, notes, ultrasounds, and laboratory results reviewed.  Denies cramping/ctx, fluid leaking, vaginal bleeding, or decreased fetal movement. Taking PNV.    Primary Prenatal Care Provider: Nita Keller  Postpartum Plans: - delivery planning: SVD without epidural. Provided education about other pain relief options available during labor. - circumcision: N/A (female fetus) - feeding: Both breast and formula.  Advised of availability of donor breastmilk in hospital. - pediatrician: [facility] - contraception: None other than condoms. Discussed importance of contraception planning post-partum to space out pregnancies for benefit of Tina Keller's health.   FHR: 149bpm Uterine size: 18cm  Assessment & Plan  1. Routine prenatal care: - Encouraged baby ASA for pre-E prophylaxis  - encouraged flu shot, patient desires to wait for now - encouraged bivalent COVID booster, patient declines - reiterated another person needs to change cat litter, patient aware and has been having someone else do this  2. Rubella non-immune - needs MMR postpartum  Next prenatal visit in 4 weeks. Labor & fetal movement precautions discussed.  Cristobal Goldmann, MS3  Patient seen along with MS3 student Cristobal Goldmann. I personally evaluated this patient along with the student, and verified all aspects of the history, physical exam, and medical decision making as documented by the student. I agree with the student's documentation and have made all necessary edits.  Chrisandra Netters, MD  [place]

## 2020-10-27 NOTE — Patient Instructions (Signed)
It was nice to meet you today!  Go to the MAU at [facility] at [facility] if: You have pain in your lower abdomen or pelvic area Your water breaks.  Sometimes it is a big gush of fluid, sometimes it is just a trickle that keeps getting your underwear wet or running down your legs You have vaginal bleeding.    Think about COVID booster Start aspirin 81mg  daily - can get this over the counter   Next visit in 4 weeks, sooner if needed   Be well, Dr. 

## 2020-10-31 ENCOUNTER — Other Ambulatory Visit: Payer: Self-pay

## 2020-10-31 ENCOUNTER — Ambulatory Visit: Payer: BC Managed Care – PPO | Attending: Family Medicine

## 2020-10-31 ENCOUNTER — Other Ambulatory Visit: Payer: Self-pay | Admitting: Family Medicine

## 2020-10-31 DIAGNOSIS — Z3482 Encounter for supervision of other normal pregnancy, second trimester: Secondary | ICD-10-CM | POA: Diagnosis not present

## 2020-11-01 ENCOUNTER — Other Ambulatory Visit: Payer: Self-pay | Admitting: *Deleted

## 2020-11-01 DIAGNOSIS — Z362 Encounter for other antenatal screening follow-up: Secondary | ICD-10-CM

## 2020-11-01 DIAGNOSIS — Z3689 Encounter for other specified antenatal screening: Secondary | ICD-10-CM

## 2020-11-01 DIAGNOSIS — R638 Other symptoms and signs concerning food and fluid intake: Secondary | ICD-10-CM

## 2020-11-15 ENCOUNTER — Encounter (HOSPITAL_COMMUNITY): Payer: Self-pay | Admitting: Emergency Medicine

## 2020-11-15 ENCOUNTER — Ambulatory Visit (HOSPITAL_COMMUNITY)
Admission: EM | Admit: 2020-11-15 | Discharge: 2020-11-15 | Disposition: A | Discharge status: Home / Self Care | Payer: BC Managed Care – PPO | Attending: Emergency Medicine | Admitting: Emergency Medicine

## 2020-11-15 ENCOUNTER — Other Ambulatory Visit: Payer: Self-pay

## 2020-11-15 DIAGNOSIS — N390 Urinary tract infection, site not specified: Secondary | ICD-10-CM | POA: Diagnosis not present

## 2020-11-15 LAB — POCT URINALYSIS DIPSTICK, ED / UC
Bilirubin Urine: NEGATIVE
Glucose, UA: NEGATIVE mg/dL
Nitrite: NEGATIVE
Protein, ur: NEGATIVE mg/dL
Specific Gravity, Urine: 1.025 (ref 1.005–1.030)
Urobilinogen, UA: 0.2 mg/dL (ref 0.0–1.0)
pH: 7 (ref 5.0–8.0)

## 2020-11-15 MED ORDER — CEPHALEXIN 500 MG PO CAPS: 500.0000 mg | ORAL_CAPSULE | Freq: Two times a day (BID) | ORAL | 0 refills | Status: AC

## 2020-11-15 NOTE — Discharge Instructions (Signed)
Take the Keflex 1 pill twice a day for the next 7 days.    You can take Tylenol as needed for pain relief and fever reduction.   Make sure you are drinking plenty of fluids, especially water.  You can drink cranberry juice to help with symptom relief, but make sure it is cranberry juice and not cranberry cocktail.  You can also try AZO, cranberry pills, or pyridium as needed.    Return or go to the Emergency Department if symptoms worsen or do not improve in the next few days.  Follow up with your OB/GYN for re-evaluation.

## 2020-11-15 NOTE — ED Triage Notes (Signed)
Pt is present today with urinary frequency. Pt states sx started x2 days ago

## 2020-11-15 NOTE — ED Provider Notes (Signed)
Tina Keller    CSN: [id number] Arrival date & time: 11/15/20  1816      History   Chief Complaint Chief Complaint  Patient presents with   Urinary Frequency    HPI Tina Keller is a 25 y.o. female.   Patient here for evaluation of urinary frequency and dysuria that has been ongoing for the past 2 days.  Denies any pelvic pain, abdominal pain, or lower back pain.  Denies any vaginal bleeding.  Patient is approximately [redacted] weeks pregnant.  Has not taken any OTC medications or treatments.  Denies any trauma, injury, or other precipitating event.  Denies any specific alleviating or aggravating factors.  Denies any fevers, chest pain, shortness of breath, N/V/D, numbness, tingling, weakness, abdominal pain, or headaches.    The history is provided by the patient.  Urinary Frequency   Past Medical History:  Diagnosis Date   Encounter for supervision of normal first pregnancy in second trimester 09/02/2015    Clinic [facility]) Prenatal Labs Dating  LMP Blood type: O/POS/-- (03/31 1427)  Genetic Screen 1 Screen:    AFP:     Quad: Normal    NIPS: Antibody:NEG (03/31 1427) Anatomic [place]  Repeat U/S normal  Rubella: <0.90 (03/31 1427) GTT Early: 85             Third trimester:  RPR: NON REAC (08/25 1336)  Flu vaccine  Declined  HBsAg: NEGATIVE (03/31 1427)  TDaP vaccine  Given                          Rhogam: N/A HIV: NONREACTIVE (08/25 1336)  Baby Food  Breast and Bottle                                     GBS: (For PCN allergy, check sensitivities)  Positive  Contraception  Undecided  Pap: Circumcision  Yes, [facility]   Pediatrician   Support Person       NSVD (normal spontaneous vaginal delivery) 12/25/2015   Positive GBS test 12/09/2015   Rubella non-immune status, antepartum 12/01/2015   Needs MMR post partum    Patient Active Problem List   Diagnosis Date Noted   Vaginal discharge during pregnancy in second trimester 10/22/2020   Poor weight gain of pregnancy 09/02/2020    Vaginal discharge during pregnancy in first trimester 07/27/2020   Positive pregnancy test 07/20/2020   Supervision of normal pregnancy 09/02/2015   CHILDHOOD OBESITY 11/26/2006    Past Surgical History:  Procedure Laterality Date   NO PAST SURGERIES      OB History     Gravida  2   Para  1   Term  1   Preterm      AB      Living  1      SAB      IAB      Ectopic      Multiple  0   Live Births  1            Home Medications    Prior to Admission medications   Medication Sig Start Date End Date Taking? Authorizing Provider  cephALEXin (KEFLEX) 500 MG capsule Take 1 capsule (500 mg total) by mouth 2 (two) times daily for 7 days. 11/15/20 11/22/20 Yes Pearson Forster, NP  Doxylamine-Pyridoxine 10-10 MG TBEC Take 1 tablet by mouth daily.  08/03/20   Sharion Settler, DO    Family History Family History  Problem Relation Age of Onset   Healthy Mother    Healthy Father     Social History Social History   Tobacco Use   Smoking status: Former    Types: Cigarettes   Smokeless tobacco: Never  Vaping Use   Vaping Use: Never used  Substance Use Topics   Alcohol use: Yes    Comment: socially   Drug use: No     Allergies   Patient has no known allergies.   Review of Systems Review of Systems  Genitourinary:  Positive for dysuria, frequency and urgency.  All other systems reviewed and are negative.   Physical Exam Triage Vital Signs ED Triage Vitals  Enc Vitals Group     BP 11/15/20 1903 114/62     Pulse Rate 11/15/20 1903 70     Resp 11/15/20 1903 19     Temp 11/15/20 1903 99.3 F (37.4 C)     Temp src --      SpO2 11/15/20 1903 99 %     Weight --      Height --      Head Circumference --      Peak Flow --      Pain Score 11/15/20 1902 0     Pain Loc --      Pain Edu? --      Excl. in [place]? --    No data found.  Updated Vital Signs BP 114/62   Pulse 70   Temp 99.3 F (37.4 C)   Resp 19   LMP 06/20/2020 (Exact Date)    SpO2 99%   Visual Acuity Right Eye Distance:   Left Eye Distance:   Bilateral Distance:    Right Eye Near:   Left Eye Near:    Bilateral Near:     Physical Exam Vitals and nursing note reviewed.  Constitutional:      General: She is not in acute distress.    Appearance: Normal appearance. She is not ill-appearing, toxic-appearing or diaphoretic.  HENT:     Head: Normocephalic and atraumatic.  Eyes:     Conjunctiva/sclera: Conjunctivae normal.  Cardiovascular:     Rate and Rhythm: Normal rate.     Pulses: Normal pulses.  Pulmonary:     Effort: Pulmonary effort is normal.  Abdominal:     General: Abdomen is flat.     Tenderness: There is no right CVA tenderness or left CVA tenderness.  Musculoskeletal:        General: Normal range of motion.     Cervical back: Normal range of motion.  Skin:    General: Skin is warm and dry.  Neurological:     General: No focal deficit present.     Mental Status: She is alert and oriented to person, place, and time.  Psychiatric:        Mood and Affect: Mood normal.     UC Treatments / Results  Labs (all labs ordered are listed, but only abnormal results are displayed) Labs Reviewed  POCT URINALYSIS DIPSTICK, ED / UC - Abnormal; Notable for the following components:      Result Value   Ketones, ur TRACE (*)    Hgb urine dipstick MODERATE (*)    Leukocytes,Ua TRACE (*)    All other components within normal limits  URINE CULTURE    EKG   Radiology No results found.  Procedures Procedures (including critical care time)  Medications Ordered in UC Medications - No data to display  Initial Impression / Assessment and Plan / UC Course  I have reviewed the triage vital signs and the nursing notes.  Pertinent labs & imaging results that were available during my care of the patient were reviewed by me and considered in my medical decision making (see chart for details).    Assessment negative for red flags or concerns.   Urinalysis with ketones, hemoglobin, and leukocytes.  Will obtain a urine culture but we will go ahead and treat for likely UTI.  As patient is pregnant we will use Keflex twice daily for the next 7 days.  Encourage fluids and rest.  May take Tylenol as needed.  Discussed conservative symptom management.  Follow-up with OB/GYN for reevaluation. Final Clinical Impressions(s) / UC Diagnoses   Final diagnoses:  Lower urinary tract infectious disease     Discharge Instructions      Take the Keflex 1 pill twice a day for the next 7 days.    You can take Tylenol as needed for pain relief and fever reduction.   Make sure you are drinking plenty of fluids, especially water.  You can drink cranberry juice to help with symptom relief, but make sure it is cranberry juice and not cranberry cocktail.  You can also try AZO, cranberry pills, or pyridium as needed.    Return or go to the Emergency Department if symptoms worsen or do not improve in the next few days.  Follow up with your OB/GYN for re-evaluation.      ED Prescriptions     Medication Sig Dispense Auth. Provider   cephALEXin (KEFLEX) 500 MG capsule Take 1 capsule (500 mg total) by mouth 2 (two) times daily for 7 days. 14 capsule Pearson Forster, NP      PDMP not reviewed this encounter.   Pearson Forster, NP 11/15/20 [phone]

## 2020-11-16 ENCOUNTER — Encounter: Payer: Self-pay | Admitting: Family Medicine

## 2020-11-18 LAB — URINE CULTURE: Culture: 70000 — AB

## 2020-11-29 ENCOUNTER — Other Ambulatory Visit: Payer: Self-pay

## 2020-11-29 ENCOUNTER — Ambulatory Visit (INDEPENDENT_AMBULATORY_CARE_PROVIDER_SITE_OTHER): Payer: BC Managed Care – PPO | Admitting: Family Medicine

## 2020-11-29 VITALS — BP 101/59 | HR 93 | Wt 277.4 lb

## 2020-11-29 DIAGNOSIS — Z3482 Encounter for supervision of other normal pregnancy, second trimester: Secondary | ICD-10-CM

## 2020-11-29 MED ORDER — ASPIRIN EC 81 MG PO TBEC: 81.0000 mg | DELAYED_RELEASE_TABLET | Freq: Every day | ORAL | 11 refills | Status: DC

## 2020-11-29 NOTE — Progress Notes (Signed)
  [facility] Prenatal Visit  Lochlyn Zullo is a 25 y.o. G2P1001 at [redacted]w[redacted]d here for routine follow up. She is dated by LMP.  She reports no complaints.  She reports good fetal movement. No bleeding, loss of fluid, contractions. See flow sheet for details. Vitals:   11/29/20 1555  BP: (!) 101/59  Pulse: 93     A/P: Pregnancy at [redacted]w[redacted]d.  Doing well.   Dating reviewed, dating tab is correct. Fetal heart tones appropriate. Fundal height appropriate. Anatomy ultrasound reviewed and notable for limited anatomy ultrasound, repeat imaging tomorrow (10/26). Confirmed by patient.  Influenza vaccine discussed but patient politely declined.  COVID vaccination was discussed and patient declined booster but has received both doses of the vaccine.  Pregnancy education provided on the following topics: fetal growth and movement, ultrasound assessment, and upcoming laboratory assessment.   [facility]uled [facility] during second trimester already completed on 9/22 with Dr. Pollie Meyer. Preterm labor precautions given.   2. Pregnancy issues include the following and were addressed as appropriate today:   Initiated aspirin therapy for preeclampsia prevention given patient qualifies based on risk factors included elevated BMI and race.   Problem list and pregnancy box updated: Yes.   Will need Tdap at next visit.   Follow up 4 weeks.

## 2020-11-29 NOTE — Patient Instructions (Addendum)
It was great seeing you today!  I am glad things are going well with your pregnancy! I have prescribed aspirin, please take aspirin 81 mg daily as we discussed.  Go to the MAU at [facility] & [facility] Center at [facility] if: You have cramping/contractions that do not go away with drinking water Your water breaks.  Sometimes it is a big gush of fluid, sometimes it is just a trickle that keeps getting your underwear wet or running down your legs You have vaginal bleeding.    You do not feel your baby moving like normal.  If you do not, get something to eat and drink and lay down and focus on feeling your baby move. If your baby is still not moving like normal, you should go to MAU.   Please follow up at your next scheduled appointment in 4 weeks, if anything arises between now and then, please don't hesitate to contact our office.   Thank you for allowing [place] to be a part of your medical care!  Thank you, Dr. Robyne Peers

## 2020-11-30 ENCOUNTER — Encounter: Payer: Self-pay | Admitting: *Deleted

## 2020-11-30 ENCOUNTER — Other Ambulatory Visit: Payer: Self-pay | Admitting: *Deleted

## 2020-11-30 ENCOUNTER — Ambulatory Visit: Payer: BC Managed Care – PPO | Admitting: *Deleted

## 2020-11-30 ENCOUNTER — Ambulatory Visit: Payer: BC Managed Care – PPO | Attending: Obstetrics and Gynecology

## 2020-11-30 VITALS — BP 114/61 | HR 75

## 2020-11-30 DIAGNOSIS — Z362 Encounter for other antenatal screening follow-up: Secondary | ICD-10-CM

## 2020-11-30 DIAGNOSIS — Z3689 Encounter for other specified antenatal screening: Secondary | ICD-10-CM | POA: Diagnosis not present

## 2020-11-30 DIAGNOSIS — Z6841 Body Mass Index (BMI) 40.0 and over, adult: Secondary | ICD-10-CM

## 2020-11-30 DIAGNOSIS — O99212 Obesity complicating pregnancy, second trimester: Secondary | ICD-10-CM | POA: Diagnosis not present

## 2020-11-30 DIAGNOSIS — R638 Other symptoms and signs concerning food and fluid intake: Secondary | ICD-10-CM | POA: Diagnosis not present

## 2020-11-30 DIAGNOSIS — E669 Obesity, unspecified: Secondary | ICD-10-CM

## 2020-11-30 DIAGNOSIS — Z3A23 23 weeks gestation of pregnancy: Secondary | ICD-10-CM | POA: Diagnosis not present

## 2021-01-04 ENCOUNTER — Ambulatory Visit: Payer: BC Managed Care – PPO | Attending: Obstetrics

## 2021-01-04 ENCOUNTER — Ambulatory Visit: Payer: BC Managed Care – PPO | Admitting: *Deleted

## 2021-01-04 ENCOUNTER — Other Ambulatory Visit: Payer: Self-pay

## 2021-01-04 ENCOUNTER — Other Ambulatory Visit: Payer: Self-pay | Admitting: *Deleted

## 2021-01-04 ENCOUNTER — Encounter: Payer: Self-pay | Admitting: *Deleted

## 2021-01-04 VITALS — BP 116/64 | HR 81

## 2021-01-04 DIAGNOSIS — O403XX0 Polyhydramnios, third trimester, not applicable or unspecified: Secondary | ICD-10-CM | POA: Insufficient documentation

## 2021-01-04 DIAGNOSIS — E669 Obesity, unspecified: Secondary | ICD-10-CM

## 2021-01-04 DIAGNOSIS — O99213 Obesity complicating pregnancy, third trimester: Secondary | ICD-10-CM | POA: Diagnosis not present

## 2021-01-04 DIAGNOSIS — Z6841 Body Mass Index (BMI) 40.0 and over, adult: Secondary | ICD-10-CM

## 2021-01-04 DIAGNOSIS — Z3A28 28 weeks gestation of pregnancy: Secondary | ICD-10-CM

## 2021-01-04 DIAGNOSIS — Z362 Encounter for other antenatal screening follow-up: Secondary | ICD-10-CM | POA: Insufficient documentation

## 2021-01-13 ENCOUNTER — Ambulatory Visit: Payer: BC Managed Care – PPO | Admitting: Family Medicine

## 2021-01-16 DIAGNOSIS — O99213 Obesity complicating pregnancy, third trimester: Secondary | ICD-10-CM | POA: Insufficient documentation

## 2021-01-16 DIAGNOSIS — Z2839 Other underimmunization status: Secondary | ICD-10-CM | POA: Insufficient documentation

## 2021-01-16 DIAGNOSIS — O09899 Supervision of other high risk pregnancies, unspecified trimester: Secondary | ICD-10-CM | POA: Insufficient documentation

## 2021-01-16 NOTE — Patient Instructions (Addendum)
Gave you your Tdap today and we will refer you to OB today where the BPP will be scheduled.  Pregnancy Related Return Precautions The follow are signs/symptoms that are abnormal in pregnancy and may require further evaluation by a physician: Go to the MAU at [facility] & [facility] Center at [facility] if: You have cramping/contractions that do not go away with drinking water, especially if they are lasting 30 seconds to 1.5 minutes, coming and going every 5-10 minutes for an hour or more, or are getting stronger and you cannot walk or talk while having a contraction/cramp. Your water breaks.  Sometimes it is a big gush of fluid, sometimes it is just a trickle that keeps getting your underwear wet or running down your legs You have vaginal bleeding.    You do not feel your baby moving like normal.  If you do not, get something to eat and drink (something cold or something with sugar like peanut butter or juice) and lay down and focus on feeling your baby move. If your baby is still not moving like normal, you should go to MAU. You should feel your baby move 6 times in one hour, or 10 times in two hours. You have a persistent headache that does not go away with 1 g of Tylenol, vision changes, chest pain, difficulty breathing, severe pain in your right upper abdomen, worsening leg swelling- these can all be signs of high blood pressure in pregnancy and need to be evaluated by a provider immediately  These are all concerning in pregnancy and if you have any of these I recommend you call your PCP and present to the Maternity Admissions Unit (map below) for further evaluation.  For any pregnancy-related emergencies, please go to the Maternity Admissions Unit in the Women's & Children's Center at [facility]. You will use hospital Entrance C.

## 2021-01-19 ENCOUNTER — Other Ambulatory Visit: Payer: Self-pay

## 2021-01-19 ENCOUNTER — Ambulatory Visit (INDEPENDENT_AMBULATORY_CARE_PROVIDER_SITE_OTHER): Payer: BC Managed Care – PPO | Admitting: Family Medicine

## 2021-01-19 VITALS — BP 112/70 | HR 93 | Wt 283.8 lb

## 2021-01-19 DIAGNOSIS — O403XX0 Polyhydramnios, third trimester, not applicable or unspecified: Secondary | ICD-10-CM

## 2021-01-19 DIAGNOSIS — O99019 Anemia complicating pregnancy, unspecified trimester: Secondary | ICD-10-CM

## 2021-01-19 DIAGNOSIS — Z3481 Encounter for supervision of other normal pregnancy, first trimester: Secondary | ICD-10-CM

## 2021-01-19 DIAGNOSIS — D509 Iron deficiency anemia, unspecified: Secondary | ICD-10-CM

## 2021-01-19 DIAGNOSIS — O09899 Supervision of other high risk pregnancies, unspecified trimester: Secondary | ICD-10-CM

## 2021-01-19 DIAGNOSIS — Z2839 Other underimmunization status: Secondary | ICD-10-CM

## 2021-01-19 DIAGNOSIS — Z87440 Personal history of urinary (tract) infections: Secondary | ICD-10-CM

## 2021-01-19 DIAGNOSIS — Z23 Encounter for immunization: Secondary | ICD-10-CM | POA: Diagnosis not present

## 2021-01-19 DIAGNOSIS — O99213 Obesity complicating pregnancy, third trimester: Secondary | ICD-10-CM

## 2021-01-19 LAB — POCT 1 HR PRENATAL GLUCOSE: Glucose 1 Hr Prenatal, POC: 133 mg/dL

## 2021-01-19 NOTE — Progress Notes (Signed)
[facility] Visit  Tina Keller is a 25 y.o. G2P1001 at [redacted]w[redacted]d(via LMP c/w 10 wk sono) [facility]. Seen today along with resident Dr. NAdah Salvage Prenatal course, history, notes, ultrasounds, and laboratory results reviewed.  Denies cramping/ctx, fluid leaking, vaginal bleeding, or decreased fetal movement. Taking PNV.    Primary Prenatal Care Provider: Dr EGwendlyn Deutscher Postpartum Plans: - delivery planning: SVD without epidural - circumcision: Na female - feeding: both - [facility]- contraception: condoms  FHR: 140 bpm Uterine size: 32 cm  Assessment & Plan  1. Routine prenatal care: - Tdap vaccine given today - declined COVID and flu vaccinations, counseled patient - passed 1 hr GTT today at 133 - check CBC, HIV, RPR today  2. Mild [facility]ramnios- [facility]. Has follow up ultrasound scheduled with MFM and will need weekly BPP starting at 34 WGA per their recommendation.  3. History of UTI x 2- s/p treatment, will get urine culture today for TOC.  4. Rubella NI- needs MMR postpartum.  5. Obesity-serial growth UKoreawith MFM.  5. Iron def anemia- Hgb 10.2 today, called patient and PO ferrous sulfate every other day sent to pharmacy. Discussed other normal lab results with Ms MReddington  Next prenatal visit in 2 weeks . Will need BPP scheduled starting at 34 WGA. Labor & fetal movement precautions discussed.  MYehuda Savannah Keller COriskaPrenatal Visit  JSaraann Ennekingis a 25y.o. G2P1001 at 328w3dere for routine follow up. She is dated by LMP.  She reports no complaints. She reports fetal movement. She denies vaginal bleeding, contractions, or loss of fluid. See flow sheet for details.  Bhick sometimes. Once every other day.  Vitals:   01/19/21 1113  BP: 112/70  Pulse: 93       A/P: Pregnancy at 3058w3dDoing well.   Routine prenatal care:  Dating reviewed, dating tab is correct Fetal heart tones Appropriate Fundal height within expected range.  Infant feeding choice: Both  Contraception choice: not interested  Infant circumcision desired not applicable  The patient does not have a history of Cesarean delivery and [facility] Influenza vaccine not administered as patient declined, will continue to discuss.   Tdap was given today. 1 hour glucola, CBC, RPR, and HIV were obtained today.    Rh status was reviewed and patient does not need Rhogam.  Rhogam was not given today.  Pregnancy medical home and PHQ-9 forms were done today and reviewed.   Pregnancy education regarding benefits of breastfeeding, contraception, fetal growth, expected weight gain, and safe infant sleep were discussed.  Preterm labor and fetal movement precautions reviewed.  2. Pregnancy issues include the following and were addressed as appropriate today:   Mild polyhydramnios- sent in referral for OB gyn and to follow up in 2 weeks for BPP.  Problem list and pregnancy box updated: Yes.   Follow up 2 weeks.

## 2021-01-20 DIAGNOSIS — Z87440 Personal history of urinary (tract) infections: Secondary | ICD-10-CM | POA: Insufficient documentation

## 2021-01-20 DIAGNOSIS — O99019 Anemia complicating pregnancy, unspecified trimester: Secondary | ICD-10-CM | POA: Insufficient documentation

## 2021-01-20 DIAGNOSIS — D509 Iron deficiency anemia, unspecified: Secondary | ICD-10-CM

## 2021-01-20 DIAGNOSIS — O403XX0 Polyhydramnios, third trimester, not applicable or unspecified: Secondary | ICD-10-CM | POA: Insufficient documentation

## 2021-01-20 HISTORY — DX: Anemia complicating pregnancy, unspecified trimester: D50.9

## 2021-01-20 LAB — CBC
Hematocrit: 30.7 % — ABNORMAL LOW (ref 34.0–46.6)
Hemoglobin: 10.2 g/dL — ABNORMAL LOW (ref 11.1–15.9)
MCH: 27.9 pg (ref 26.6–33.0)
MCHC: 33.2 g/dL (ref 31.5–35.7)
MCV: 84 fL (ref 79–97)
Platelets: 183 10*3/uL (ref 150–450)
RBC: 3.65 x10E6/uL — ABNORMAL LOW (ref 3.77–5.28)
RDW: 12.3 % (ref 11.7–15.4)
WBC: 7.3 10*3/uL (ref 3.4–10.8)

## 2021-01-20 LAB — HIV ANTIBODY (ROUTINE TESTING W REFLEX): HIV Screen 4th Generation wRfx: NONREACTIVE

## 2021-01-20 LAB — RPR: RPR Ser Ql: NONREACTIVE

## 2021-01-20 MED ORDER — FERROUS SULFATE 325 (65 FE) MG PO TBEC: 325.0000 mg | DELAYED_RELEASE_TABLET | ORAL | 2 refills | Status: DC

## 2021-01-21 LAB — CULTURE, OB URINE

## 2021-01-21 LAB — URINE CULTURE, OB REFLEX

## 2021-02-01 ENCOUNTER — Ambulatory Visit: Payer: BC Managed Care – PPO | Admitting: *Deleted

## 2021-02-01 ENCOUNTER — Other Ambulatory Visit: Payer: Self-pay

## 2021-02-01 ENCOUNTER — Ambulatory Visit: Payer: BC Managed Care – PPO | Attending: Obstetrics and Gynecology

## 2021-02-01 ENCOUNTER — Other Ambulatory Visit: Payer: Self-pay | Admitting: *Deleted

## 2021-02-01 VITALS — BP 111/73 | HR 99

## 2021-02-01 DIAGNOSIS — O99213 Obesity complicating pregnancy, third trimester: Secondary | ICD-10-CM

## 2021-02-01 DIAGNOSIS — O403XX0 Polyhydramnios, third trimester, not applicable or unspecified: Secondary | ICD-10-CM | POA: Diagnosis not present

## 2021-02-01 DIAGNOSIS — Z3A32 32 weeks gestation of pregnancy: Secondary | ICD-10-CM

## 2021-02-01 DIAGNOSIS — E669 Obesity, unspecified: Secondary | ICD-10-CM | POA: Diagnosis not present

## 2021-02-03 ENCOUNTER — Other Ambulatory Visit: Payer: Self-pay | Admitting: *Deleted

## 2021-02-03 DIAGNOSIS — O99213 Obesity complicating pregnancy, third trimester: Secondary | ICD-10-CM

## 2021-02-05 NOTE — L&D Delivery Note (Signed)
OB/GYN Faculty Practice Delivery Note  Tina Keller is a 26 y.o. G2P1001 s/p SVD at [redacted]w[redacted]d. She was admitted for IOL.   ROM: 2h 68m with clear fluid GBS Status:  Negative/-- (02/01 1227) Maximum Maternal Temperature: afebrile  Labor Progress: Initial SVE: closed/thick. She then progressed to complete.   Delivery Date/Time:  Delivery: Called to room and patient was complete and pushing. Head delivered ROA. Loose nuchal cord present. Shoulder and body delivered in usual fashion. Infant with spontaneous cry, placed on mother's abdomen, dried and stimulated. Cord clamped x 2 after 1-minute delay, and cut by father of baby. Cord blood drawn. Placenta delivered spontaneously with gentle cord traction. Fundus firm with massage and Pitocin. Labia, perineum, vagina, and cervix inspected inspected with no lacerations.  Baby Weight: pending  Placenta: Sent to L&D\ Complications: None Lacerations: none EBL: 250 mL Analgesia: Epidural   Infant:  APGAR (1 MIN): 8   APGAR (5 MINS): 9   APGAR (10 MINS):     [place] for [place] 03/20/2021, 3:11 PM

## 2021-02-15 ENCOUNTER — Ambulatory Visit (INDEPENDENT_AMBULATORY_CARE_PROVIDER_SITE_OTHER): Payer: BC Managed Care – PPO

## 2021-02-15 ENCOUNTER — Ambulatory Visit: Payer: BC Managed Care – PPO | Admitting: *Deleted

## 2021-02-15 ENCOUNTER — Other Ambulatory Visit: Payer: Self-pay

## 2021-02-15 VITALS — BP 107/68 | HR 90 | Wt 278.0 lb

## 2021-02-15 DIAGNOSIS — O99210 Obesity complicating pregnancy, unspecified trimester: Secondary | ICD-10-CM

## 2021-02-15 DIAGNOSIS — O403XX0 Polyhydramnios, third trimester, not applicable or unspecified: Secondary | ICD-10-CM | POA: Diagnosis not present

## 2021-02-15 DIAGNOSIS — Z3A34 34 weeks gestation of pregnancy: Secondary | ICD-10-CM

## 2021-02-15 NOTE — Progress Notes (Signed)

## 2021-02-16 NOTE — Progress Notes (Signed)
[place] Prenatal Visit  Tina Keller is a 26 y.o. G2P1001 at [redacted]w[redacted]d for routine follow up.  She reports no concerns. She reports fetal movement. She denies vaginal bleeding, contractions, or loss of fluid.   See flow sheet for details.  She was last seen on 12/15 in faculty OB and was referred to [facility] for duration of prenatal care due to mild polyhydramnios. Appears patient had an appointment scheduled 1/17 but cancelled it.   Vitals:   02/22/21 1117  BP: 114/70  Pulse: 87    A/P: Pregnancy at [redacted]w[redacted]d.  Doing well.   Routine prenatal care:  Infant feeding choice: Previously wanted to breast feed, now wants to do formula Contraception choice: Declines Infant circumcision: N/a, female  Tdap was previously given.  COVID vaccination was discussed and declines booster.  GBS/GC/CZ testing was not performed today. Preterm labor precautions reviewed. Safe sleep discussed. Kick counts reviewed.  2. Pregnancy issues include the following and were addressed as appropriate today: Rubella non-immune: offer MMR post-partum Iron-deficiency anemia: on supplementation; refilled today At risk for Pre-E: On ASA, has not been taking as she ran out. Refilled today. Polyhydramnios in third trimester. Patient states this has resolved though she is measuring slightly greater than dates. Followed by MFM biweekly, next appointment 1/25 Maternal obesity, BMI 42: weekly fetal testing per MFM. Next appointment 1/25 Problem list and pregnancy box updated: Yes.   Follow up in 2 weeks.

## 2021-02-21 ENCOUNTER — Other Ambulatory Visit: Payer: BC Managed Care – PPO

## 2021-02-21 NOTE — Patient Instructions (Addendum)
It was wonderful to see you today.  Please bring ALL of your medications with you to every visit.   Today we talked about:  -I have refilled your iron supplements and your Aspirin. You can take the iron every other day. Please take the aspirin daily.  -Your next non-stress test is scheduled on 03/01/2021 12:45 PM at [facility]. The address is [address], [place], [place] [address]  If you experience any vaginal bleeding, leakage of fluids, don't feel your baby moving as much, or start to have contractions less than 5 minutes apart please go directly to the Maternal Assessment Unit at [facility] for evaluation.  Maternity and women's care services located on the [place] side of [facility]. [facility] (Entrance C off [address]).  [address] Entr[address] [place],  [place]  [address]      Thank you for choosing [facility].   Please call [phone] with any questions about today's appointment.  Please be sure to schedule follow up at the front  desk before you leave today.   Sabino Dick, DO PGY-2 Family Medicine

## 2021-02-22 ENCOUNTER — Ambulatory Visit (INDEPENDENT_AMBULATORY_CARE_PROVIDER_SITE_OTHER): Payer: BC Managed Care – PPO | Admitting: Family Medicine

## 2021-02-22 ENCOUNTER — Other Ambulatory Visit: Payer: Self-pay

## 2021-02-22 VITALS — BP 114/70 | HR 87 | Wt 279.8 lb

## 2021-02-22 DIAGNOSIS — Z3483 Encounter for supervision of other normal pregnancy, third trimester: Secondary | ICD-10-CM

## 2021-02-22 DIAGNOSIS — O99019 Anemia complicating pregnancy, unspecified trimester: Secondary | ICD-10-CM

## 2021-02-22 DIAGNOSIS — D509 Iron deficiency anemia, unspecified: Secondary | ICD-10-CM

## 2021-02-22 MED ORDER — FERROUS SULFATE 325 (65 FE) MG PO TBEC: 325.0000 mg | DELAYED_RELEASE_TABLET | ORAL | 2 refills | Status: DC

## 2021-02-22 MED ORDER — ASPIRIN EC 81 MG PO TBEC: 81.0000 mg | DELAYED_RELEASE_TABLET | Freq: Every day | ORAL | 11 refills | Status: DC

## 2021-03-01 ENCOUNTER — Ambulatory Visit: Payer: BC Managed Care – PPO | Admitting: *Deleted

## 2021-03-01 ENCOUNTER — Other Ambulatory Visit: Payer: Self-pay

## 2021-03-01 ENCOUNTER — Ambulatory Visit: Payer: BC Managed Care – PPO | Attending: Obstetrics

## 2021-03-01 VITALS — BP 119/71 | HR 69

## 2021-03-01 DIAGNOSIS — E669 Obesity, unspecified: Secondary | ICD-10-CM

## 2021-03-01 DIAGNOSIS — Z3A36 36 weeks gestation of pregnancy: Secondary | ICD-10-CM

## 2021-03-01 DIAGNOSIS — Z6841 Body Mass Index (BMI) 40.0 and over, adult: Secondary | ICD-10-CM | POA: Diagnosis not present

## 2021-03-01 DIAGNOSIS — O99213 Obesity complicating pregnancy, third trimester: Secondary | ICD-10-CM | POA: Diagnosis not present

## 2021-03-01 DIAGNOSIS — O403XX0 Polyhydramnios, third trimester, not applicable or unspecified: Secondary | ICD-10-CM | POA: Diagnosis not present

## 2021-03-02 ENCOUNTER — Other Ambulatory Visit: Payer: Self-pay | Admitting: *Deleted

## 2021-03-02 DIAGNOSIS — Z6841 Body Mass Index (BMI) 40.0 and over, adult: Secondary | ICD-10-CM

## 2021-03-02 DIAGNOSIS — O409XX0 Polyhydramnios, unspecified trimester, not applicable or unspecified: Secondary | ICD-10-CM

## 2021-03-08 ENCOUNTER — Encounter (HOSPITAL_COMMUNITY): Payer: Self-pay | Admitting: *Deleted

## 2021-03-08 ENCOUNTER — Telehealth (HOSPITAL_COMMUNITY): Payer: Self-pay | Admitting: *Deleted

## 2021-03-08 ENCOUNTER — Encounter (HOSPITAL_COMMUNITY): Payer: Self-pay

## 2021-03-08 ENCOUNTER — Ambulatory Visit (HOSPITAL_BASED_OUTPATIENT_CLINIC_OR_DEPARTMENT_OTHER): Payer: BC Managed Care – PPO

## 2021-03-08 ENCOUNTER — Ambulatory Visit (INDEPENDENT_AMBULATORY_CARE_PROVIDER_SITE_OTHER): Payer: BC Managed Care – PPO | Admitting: Family Medicine

## 2021-03-08 ENCOUNTER — Ambulatory Visit: Payer: BC Managed Care – PPO | Admitting: *Deleted

## 2021-03-08 ENCOUNTER — Other Ambulatory Visit: Payer: Self-pay

## 2021-03-08 ENCOUNTER — Other Ambulatory Visit (HOSPITAL_COMMUNITY)
Admission: RE | Admit: 2021-03-08 | Discharge: 2021-03-08 | Disposition: A | Discharge status: Home / Self Care | Payer: BC Managed Care – PPO | Source: Ambulatory Visit | Attending: Family Medicine | Admitting: Family Medicine

## 2021-03-08 VITALS — BP 117/68 | HR 65

## 2021-03-08 VITALS — BP 122/70 | HR 90 | Wt 279.6 lb

## 2021-03-08 DIAGNOSIS — Z3483 Encounter for supervision of other normal pregnancy, third trimester: Secondary | ICD-10-CM

## 2021-03-08 DIAGNOSIS — E668 Other obesity: Secondary | ICD-10-CM

## 2021-03-08 DIAGNOSIS — Z2839 Other underimmunization status: Secondary | ICD-10-CM

## 2021-03-08 DIAGNOSIS — Z3A37 37 weeks gestation of pregnancy: Secondary | ICD-10-CM

## 2021-03-08 DIAGNOSIS — O99213 Obesity complicating pregnancy, third trimester: Secondary | ICD-10-CM

## 2021-03-08 DIAGNOSIS — O99019 Anemia complicating pregnancy, unspecified trimester: Secondary | ICD-10-CM

## 2021-03-08 DIAGNOSIS — O403XX0 Polyhydramnios, third trimester, not applicable or unspecified: Secondary | ICD-10-CM | POA: Diagnosis not present

## 2021-03-08 DIAGNOSIS — A749 Chlamydial infection, unspecified: Secondary | ICD-10-CM

## 2021-03-08 DIAGNOSIS — O409XX0 Polyhydramnios, unspecified trimester, not applicable or unspecified: Secondary | ICD-10-CM | POA: Insufficient documentation

## 2021-03-08 DIAGNOSIS — D509 Iron deficiency anemia, unspecified: Secondary | ICD-10-CM

## 2021-03-08 DIAGNOSIS — Z6841 Body Mass Index (BMI) 40.0 and over, adult: Secondary | ICD-10-CM | POA: Insufficient documentation

## 2021-03-08 DIAGNOSIS — O09899 Supervision of other high risk pregnancies, unspecified trimester: Secondary | ICD-10-CM

## 2021-03-08 HISTORY — DX: Chlamydial infection, unspecified: A74.9

## 2021-03-08 LAB — OB RESULTS CONSOLE GC/CHLAMYDIA: Gonorrhea: NEGATIVE

## 2021-03-08 NOTE — Patient Instructions (Signed)
Labor Induction ?Labor induction is when steps are taken to cause a pregnant woman to begin the labor process. Most women go into labor on their own between 37 weeks and 42 weeks of pregnancy. When this does not happen, or when there is a medical need for labor to begin, steps may be taken to induce, or bring on, labor. ?Labor induction causes a pregnant woman's uterus to contract. It also causes the cervix to soften (ripen), open (dilate), and thin out. Usually, labor is not induced before 39 weeks of pregnancy unless there is a medical reason to do so. ?When is labor induction considered? ?Labor induction may be right for you if: ?Your pregnancy lasts longer than 41 to 42 weeks. ?Your placenta is separating from your uterus (placental abruption). ?You have a rupture of membranes and your labor does not begin. ?You have health problems, like diabetes or high blood pressure (preeclampsia) during your pregnancy. ?Your baby has stopped growing or does not have enough amniotic fluid. ?Before labor induction begins, your health care provider will consider the following factors: ?Your medical condition and the baby's condition. ?How many weeks you have been pregnant. ?How mature the baby's lungs are. ?The condition of your cervix. ?The position of the baby. ?The size of your birth canal. ?Tell a health care provider about: ?Any allergies you have. ?All medicines you are taking, including vitamins, herbs, eye drops, creams, and over-the-counter medicines. ?Any problems you or your family members have had with anesthetic medicines. ?Any surgeries you have had. ?Any blood disorders you have. ?Any medical conditions you have. ?What are the risks? ?Generally, this is a safe procedure. However, problems may occur, including: ?Failed induction. ?Changes in fetal heart rate, such as being too high, too low, or irregular (erratic). ?Infection in the mother or the baby. ?Increased risk of having a cesarean delivery. ?Breaking off  (abruption) of the placenta from the uterus. This is rare. ?Rupture of the uterus. This is very rare. ?Your baby could fail to get enough blood flow or oxygen. This can be life-threatening. ?When induction is needed for medical reasons, the benefits generally outweigh the risks. ?What happens during the procedure? ?During the procedure, your health care provider will use one of these methods to induce labor: ?Stripping the membranes. In this method, the amniotic sac tissue is gently separated from the cervix. This causes the following to happen: ?Your cervix stretches, which in turn causes the release of prostaglandins. ?Prostaglandins induce labor and cause the uterus to contract. ?This procedure is often done in an office visit. You will be sent home to wait for contractions to begin. ?Prostaglandin medicine. This medicine starts contractions and causes the cervix to dilate and ripen. This can be taken by mouth (orally) or by being inserted into the vagina (suppository). ?Inserting a small, thin tube (catheter) with a balloon into the vagina and then expanding the balloon with water to dilate the cervix. ?Breaking the water. In this method, a small instrument is used to make a small hole in the amniotic sac. This eventually causes the amniotic sac to break. Contractions should begin within a few hours. ?Medicine to trigger or strengthen contractions. This medicine is given through an IV that is inserted into a vein in your arm. ?This procedure may vary among health care providers and hospitals. ?Where to find more information ?March of Dimes: www.marchofdimes.org ?The American College of Obstetricians and Gynecologists: www.acog.org ?Summary ?Labor induction causes a pregnant woman's uterus to contract. It also causes the cervix   to soften (ripen), open (dilate), and thin out. ?Labor is usually not induced before 39 weeks of pregnancy unless there is a medical reason to do so. ?When induction is needed for medical  reasons, the benefits generally outweigh the risks. ?Talk with your health care provider about which methods of labor induction are right for you. ?This information is not intended to replace advice given to you by your health care provider. Make sure you discuss any questions you have with your health care provider. ?Document Revised: 11/05/2019 Document Reviewed: 11/05/2019 ?Elsevier Patient Education ? 2022 Elsevier Inc. ? ?

## 2021-03-08 NOTE — Progress Notes (Signed)
[place] Prenatal Visit  Tina Keller is a 26 y.o. G2P1001 at [redacted]w[redacted]d here for routine follow up. She is dated by LMP.  She reports no complaints. She reports fetal movement. She denies vaginal bleeding, contractions, or loss of fluid. See flow sheet for details.  Vitals:   03/08/21 1016  BP: 130/76  Pulse: 90    A/P: Pregnancy at [redacted]w[redacted]d.  Doing well.   Routine prenatal care  Dating reviewed, dating tab is correct Fetal heart tones Appropriate Fundal height within expected range.  Fetal position confirmed Vertex using 1/25 Ultrasound  .  GBS collected today. .  Repeat GC/CT collected today.  The patient does not have a history of HSV and valacyclovir is not indicated at this time.  Infant feeding choice: Formula feeding  Contraception choice: Declined, condoms Infant circumcision desired not applicable Influenza vaccine not administered as patient declined, will continue to discuss.   Tdap previously administered between 27-36 weeks  COVID vaccination was discussed and patient declined booster, continue to discuss  Pregnancy education regarding preterm labor, fetal movement, fetal growth were discussed.    2. Pregnancy issues include the following and were addressed as appropriate today:  Rubella non-immune status- MMR vaccine postpartum Iron deficiency anemia- continue PNV and iron supplement Mild polyhydramnios- [place] today with MFM; reviewed last [place] 1/25 and mild. Will induce at 39 weeks induction scheduled today.   Problem list and pregnancy box updated: Yes.  Follow up 1 week.

## 2021-03-08 NOTE — Telephone Encounter (Signed)
Preadmission screen  

## 2021-03-09 LAB — CERVICOVAGINAL ANCILLARY ONLY
Chlamydia: POSITIVE — AB
Comment: NEGATIVE
Comment: NORMAL
Neisseria Gonorrhea: NEGATIVE

## 2021-03-10 ENCOUNTER — Telehealth: Payer: Self-pay | Admitting: Family Medicine

## 2021-03-10 DIAGNOSIS — A749 Chlamydial infection, unspecified: Secondary | ICD-10-CM

## 2021-03-10 MED ORDER — AZITHROMYCIN 1 G PO PACK: 1.0000 g | PACK | Freq: Once | ORAL | 0 refills | Status: AC

## 2021-03-10 NOTE — Telephone Encounter (Signed)
Patient made aware of positive results. Tx sent to pharmacy. We will follow up next week.   Lavonda Jumbo, DO 03/10/2021, 3:52 PM PGY-3, [facility]

## 2021-03-12 LAB — CULTURE, BETA STREP (GROUP B ONLY): Strep Gp B Culture: NEGATIVE

## 2021-03-14 ENCOUNTER — Other Ambulatory Visit: Payer: Self-pay | Admitting: Family Medicine

## 2021-03-15 ENCOUNTER — Ambulatory Visit: Payer: BC Managed Care – PPO | Attending: Maternal & Fetal Medicine

## 2021-03-15 ENCOUNTER — Other Ambulatory Visit (HOSPITAL_COMMUNITY): Payer: Self-pay | Admitting: *Deleted

## 2021-03-15 ENCOUNTER — Ambulatory Visit (INDEPENDENT_AMBULATORY_CARE_PROVIDER_SITE_OTHER): Payer: BC Managed Care – PPO | Admitting: Family Medicine

## 2021-03-15 ENCOUNTER — Ambulatory Visit: Payer: BC Managed Care – PPO | Admitting: *Deleted

## 2021-03-15 ENCOUNTER — Other Ambulatory Visit: Payer: Self-pay | Admitting: Maternal & Fetal Medicine

## 2021-03-15 ENCOUNTER — Other Ambulatory Visit: Payer: Self-pay

## 2021-03-15 ENCOUNTER — Encounter (HOSPITAL_COMMUNITY): Payer: Self-pay | Admitting: *Deleted

## 2021-03-15 VITALS — BP 116/70 | HR 85

## 2021-03-15 VITALS — BP 129/76 | HR 94 | Wt 275.8 lb

## 2021-03-15 DIAGNOSIS — Z3A38 38 weeks gestation of pregnancy: Secondary | ICD-10-CM | POA: Diagnosis not present

## 2021-03-15 DIAGNOSIS — Z6841 Body Mass Index (BMI) 40.0 and over, adult: Secondary | ICD-10-CM

## 2021-03-15 DIAGNOSIS — O403XX0 Polyhydramnios, third trimester, not applicable or unspecified: Secondary | ICD-10-CM | POA: Diagnosis not present

## 2021-03-15 DIAGNOSIS — O409XX0 Polyhydramnios, unspecified trimester, not applicable or unspecified: Secondary | ICD-10-CM

## 2021-03-15 DIAGNOSIS — Z3483 Encounter for supervision of other normal pregnancy, third trimester: Secondary | ICD-10-CM | POA: Diagnosis not present

## 2021-03-15 DIAGNOSIS — A749 Chlamydial infection, unspecified: Secondary | ICD-10-CM | POA: Insufficient documentation

## 2021-03-15 DIAGNOSIS — O368130 Decreased fetal movements, third trimester, not applicable or unspecified: Secondary | ICD-10-CM

## 2021-03-15 DIAGNOSIS — O99213 Obesity complicating pregnancy, third trimester: Secondary | ICD-10-CM | POA: Insufficient documentation

## 2021-03-15 NOTE — Patient Instructions (Signed)
Labor Induction ?Labor induction is when steps are taken to cause a pregnant woman to begin the labor process. Most women go into labor on their own between 37 weeks and 42 weeks of pregnancy. When this does not happen, or when there is a medical need for labor to begin, steps may be taken to induce, or bring on, labor. ?Labor induction causes a pregnant woman's uterus to contract. It also causes the cervix to soften (ripen), open (dilate), and thin out. Usually, labor is not induced before 39 weeks of pregnancy unless there is a medical reason to do so. ?When is labor induction considered? ?Labor induction may be right for you if: ?Your pregnancy lasts longer than 41 to 42 weeks. ?Your placenta is separating from your uterus (placental abruption). ?You have a rupture of membranes and your labor does not begin. ?You have health problems, like diabetes or high blood pressure (preeclampsia) during your pregnancy. ?Your baby has stopped growing or does not have enough amniotic fluid. ?Before labor induction begins, your health care provider will consider the following factors: ?Your medical condition and the baby's condition. ?How many weeks you have been pregnant. ?How mature the baby's lungs are. ?The condition of your cervix. ?The position of the baby. ?The size of your birth canal. ?Tell a health care provider about: ?Any allergies you have. ?All medicines you are taking, including vitamins, herbs, eye drops, creams, and over-the-counter medicines. ?Any problems you or your family members have had with anesthetic medicines. ?Any surgeries you have had. ?Any blood disorders you have. ?Any medical conditions you have. ?What are the risks? ?Generally, this is a safe procedure. However, problems may occur, including: ?Failed induction. ?Changes in fetal heart rate, such as being too high, too low, or irregular (erratic). ?Infection in the mother or the baby. ?Increased risk of having a cesarean delivery. ?Breaking off  (abruption) of the placenta from the uterus. This is rare. ?Rupture of the uterus. This is very rare. ?Your baby could fail to get enough blood flow or oxygen. This can be life-threatening. ?When induction is needed for medical reasons, the benefits generally outweigh the risks. ?What happens during the procedure? ?During the procedure, your health care provider will use one of these methods to induce labor: ?Stripping the membranes. In this method, the amniotic sac tissue is gently separated from the cervix. This causes the following to happen: ?Your cervix stretches, which in turn causes the release of prostaglandins. ?Prostaglandins induce labor and cause the uterus to contract. ?This procedure is often done in an office visit. You will be sent home to wait for contractions to begin. ?Prostaglandin medicine. This medicine starts contractions and causes the cervix to dilate and ripen. This can be taken by mouth (orally) or by being inserted into the vagina (suppository). ?Inserting a small, thin tube (catheter) with a balloon into the vagina and then expanding the balloon with water to dilate the cervix. ?Breaking the water. In this method, a small instrument is used to make a small hole in the amniotic sac. This eventually causes the amniotic sac to break. Contractions should begin within a few hours. ?Medicine to trigger or strengthen contractions. This medicine is given through an IV that is inserted into a vein in your arm. ?This procedure may vary among health care providers and hospitals. ?Where to find more information ?March of Dimes: www.marchofdimes.org ?The American College of Obstetricians and Gynecologists: www.acog.org ?Summary ?Labor induction causes a pregnant woman's uterus to contract. It also causes the cervix   to soften (ripen), open (dilate), and thin out. ?Labor is usually not induced before 39 weeks of pregnancy unless there is a medical reason to do so. ?When induction is needed for medical  reasons, the benefits generally outweigh the risks. ?Talk with your health care provider about which methods of labor induction are right for you. ?This information is not intended to replace advice given to you by your health care provider. Make sure you discuss any questions you have with your health care provider. ?Document Revised: 11/05/2019 Document Reviewed: 11/05/2019 ?Elsevier Patient Education ? 2022 Elsevier Inc. ? ?

## 2021-03-15 NOTE — Procedures (Signed)
Tina Keller [birth year] [redacted]w[redacted]d  Fetus A Non-Stress Test Interpretation for 03/15/21  Indication: Decreased Fetal Movement  Fetal Heart Rate A Mode: External Baseline Rate (A): 130 bpm Variability: Moderate Accelerations: 15 x 15 Decelerations: None Multiple birth?: No  Uterine Activity Mode: Palpation, Toco Contraction Frequency (min): one uc Contraction Duration (sec): 70 Contraction Quality: Mild Resting Tone Palpated: Relaxed Resting Time: Adequate  Interpretation (Fetal Testing) Nonstress Test Interpretation: Reactive Overall Impression: Reassuring for gestational age Comments: Dr. Judeth Cornfield reviewed tracing

## 2021-03-15 NOTE — Progress Notes (Signed)
°  [place] Prenatal Visit  Tina Keller is a 26 y.o. G2P1001 at [redacted]w[redacted]d here for routine follow up. She is dated by LMP.  She reports no complaints. She reports fetal movement. She denies vaginal bleeding, contractions, or loss of fluid. See flow sheet for details.  Vitals:   03/15/21 1432  BP: 129/76  Pulse: 94    A/P: Pregnancy at [redacted]w[redacted]d.  Doing well.   Routine prenatal care:  Dating reviewed, dating tab is correct Fetal heart tones Appropriate Fundal height within expected range.  Fetal position confirmed Vertex using Ultrasound . MFM BPP reviewed today 10/10.  Infant feeding choice: Both  Contraception choice: Undecided  Infant circumcision desired not applicable Pain control in labor discussed and patient desires IV pain medication.  Influenza vaccine not administered as patient declined, will continue to discuss.   Tdap previously administered between 27-36 weeks  GBS and gc/chlamydia testing results were reviewed today.   Pregnancy education regarding labor, fetal movement,  benefits of breastfeeding, contraception, and safe infant sleep were discussed.  Labor and fetal movement precautions reviewed. Induction of labor scheduled 2/13 2023 for polyhydramnios.  2. Pregnancy issues include the following and were addressed as appropriate today:  Polyhydramnios-reviewed MFM BPP today 10 out of 10.  Fundal height measuring as expected today.  Scheduled for induction at 39 weeks. Positive for chlamydia 2/1-confirmed treatment.  Patient also states that she has refrained from sexual activity and that her partner was also treated.  Patient voiced understanding of chlamydial infection during pregnancy.  No need to return at this time.  Needs test of cure in 4 to 6 weeks. Rubella nonimmune-needs vaccination postpartum  Problem list and pregnancy box updated: No.   Follow up as needed.

## 2021-03-16 ENCOUNTER — Telehealth: Payer: Self-pay

## 2021-03-16 ENCOUNTER — Telehealth: Payer: Self-pay | Admitting: Family Medicine

## 2021-03-16 NOTE — Telephone Encounter (Signed)
[employer] Department of [employer] and  [employer] form faxed to [facility] Department.  Ozella Almond, [place]

## 2021-03-16 NOTE — Telephone Encounter (Signed)
Called patient to ensure treatment completed for chlamydia and that she has had partner treated.  She also states she has abstain from sex since treatment.  Reiterated need for retreatment if the above is not accurate.  Patient reinforced the above and appreciative of the call.  [name], DO 03/16/2021, 9:28 AM PGY-3, [facility]

## 2021-03-16 NOTE — Telephone Encounter (Signed)
-----   Message from Jennette Bill, CMA sent at 03/10/2021  9:16 AM EST ----- Regarding: STI reporting Pos Chlamydia

## 2021-03-19 DIAGNOSIS — Z03818 Encounter for observation for suspected exposure to other biological agents ruled out: Secondary | ICD-10-CM | POA: Diagnosis not present

## 2021-03-19 DIAGNOSIS — Z20822 Contact with and (suspected) exposure to covid-19: Secondary | ICD-10-CM | POA: Diagnosis not present

## 2021-03-20 ENCOUNTER — Other Ambulatory Visit: Payer: Self-pay

## 2021-03-20 ENCOUNTER — Encounter (HOSPITAL_COMMUNITY): Payer: Self-pay | Admitting: Family Medicine

## 2021-03-20 ENCOUNTER — Inpatient Hospital Stay (HOSPITAL_COMMUNITY): Payer: BC Managed Care – PPO

## 2021-03-20 ENCOUNTER — Inpatient Hospital Stay (HOSPITAL_COMMUNITY): Payer: BC Managed Care – PPO | Admitting: Anesthesiology

## 2021-03-20 ENCOUNTER — Inpatient Hospital Stay (HOSPITAL_COMMUNITY)
Admission: AD | Admit: 2021-03-20 | Discharge: 2021-03-21 | DRG: 807 | Disposition: A | Discharge status: Home / Self Care | Payer: BC Managed Care – PPO | Attending: Family Medicine | Admitting: Family Medicine

## 2021-03-20 DIAGNOSIS — O09899 Supervision of other high risk pregnancies, unspecified trimester: Secondary | ICD-10-CM

## 2021-03-20 DIAGNOSIS — O99824 Streptococcus B carrier state complicating childbirth: Secondary | ICD-10-CM | POA: Diagnosis not present

## 2021-03-20 DIAGNOSIS — O99214 Obesity complicating childbirth: Secondary | ICD-10-CM | POA: Diagnosis not present

## 2021-03-20 DIAGNOSIS — Z3A39 39 weeks gestation of pregnancy: Secondary | ICD-10-CM | POA: Diagnosis not present

## 2021-03-20 DIAGNOSIS — Z2839 Other underimmunization status: Secondary | ICD-10-CM

## 2021-03-20 DIAGNOSIS — Z87891 Personal history of nicotine dependence: Secondary | ICD-10-CM

## 2021-03-20 DIAGNOSIS — O99213 Obesity complicating pregnancy, third trimester: Secondary | ICD-10-CM | POA: Diagnosis present

## 2021-03-20 DIAGNOSIS — Z3490 Encounter for supervision of normal pregnancy, unspecified, unspecified trimester: Secondary | ICD-10-CM

## 2021-03-20 DIAGNOSIS — O9902 Anemia complicating childbirth: Secondary | ICD-10-CM | POA: Diagnosis present

## 2021-03-20 DIAGNOSIS — Z7982 Long term (current) use of aspirin: Secondary | ICD-10-CM | POA: Diagnosis not present

## 2021-03-20 DIAGNOSIS — O403XX0 Polyhydramnios, third trimester, not applicable or unspecified: Principal | ICD-10-CM | POA: Diagnosis present

## 2021-03-20 DIAGNOSIS — O403XX1 Polyhydramnios, third trimester, fetus 1: Secondary | ICD-10-CM | POA: Diagnosis not present

## 2021-03-20 DIAGNOSIS — D509 Iron deficiency anemia, unspecified: Secondary | ICD-10-CM | POA: Diagnosis not present

## 2021-03-20 DIAGNOSIS — D649 Anemia, unspecified: Secondary | ICD-10-CM | POA: Diagnosis not present

## 2021-03-20 DIAGNOSIS — Z880 Allergy status to penicillin: Secondary | ICD-10-CM

## 2021-03-20 DIAGNOSIS — O99019 Anemia complicating pregnancy, unspecified trimester: Secondary | ICD-10-CM | POA: Diagnosis present

## 2021-03-20 LAB — CBC
HCT: 36.4 % (ref 36.0–46.0)
Hemoglobin: 12 g/dL (ref 12.0–15.0)
MCH: 29.3 pg (ref 26.0–34.0)
MCHC: 33 g/dL (ref 30.0–36.0)
MCV: 88.8 fL (ref 80.0–100.0)
Platelets: 172 10*3/uL (ref 150–400)
RBC: 4.1 MIL/uL (ref 3.87–5.11)
RDW: 13.4 % (ref 11.5–15.5)
WBC: 7.7 10*3/uL (ref 4.0–10.5)
nRBC: 0 % (ref 0.0–0.2)

## 2021-03-20 LAB — RPR: RPR Ser Ql: NONREACTIVE

## 2021-03-20 MED ORDER — ONDANSETRON HCL 4 MG PO TABS: 4.0000 mg | ORAL_TABLET | ORAL | Status: DC | PRN

## 2021-03-20 MED ORDER — SENNOSIDES-DOCUSATE SODIUM 8.6-50 MG PO TABS
2.0000 | ORAL_TABLET | Freq: Every day | ORAL | Status: DC
  Administered 2021-03-21: 2 via ORAL
  Filled 2021-03-20: qty 2

## 2021-03-20 MED ORDER — DIPHENHYDRAMINE HCL 50 MG/ML IJ SOLN: 12.5000 mg | INTRAMUSCULAR | Status: DC | PRN

## 2021-03-20 MED ORDER — ONDANSETRON HCL 4 MG/2ML IJ SOLN
4.0000 mg | Freq: Four times a day (QID) | INTRAMUSCULAR | Status: DC | PRN
  Administered 2021-03-20: 4 mg via INTRAVENOUS
  Filled 2021-03-20: qty 2

## 2021-03-20 MED ORDER — PHENYLEPHRINE 40 MCG/ML (10ML) SYRINGE FOR IV PUSH (FOR BLOOD PRESSURE SUPPORT): 80.0000 ug | PREFILLED_SYRINGE | INTRAVENOUS | Status: DC | PRN

## 2021-03-20 MED ORDER — FENTANYL-BUPIVACAINE-NACL 0.5-0.125-0.9 MG/250ML-% EP SOLN
12.0000 mL/h | EPIDURAL | Status: DC | PRN
  Administered 2021-03-20: 12 mL/h via EPIDURAL
  Filled 2021-03-20: qty 250

## 2021-03-20 MED ORDER — WITCH HAZEL-GLYCERIN EX PADS: 1.0000 "application " | MEDICATED_PAD | CUTANEOUS | Status: DC | PRN

## 2021-03-20 MED ORDER — PRENATAL MULTIVITAMIN CH
1.0000 | ORAL_TABLET | Freq: Every day | ORAL | Status: DC
  Administered 2021-03-21: 1 via ORAL
  Filled 2021-03-20: qty 1

## 2021-03-20 MED ORDER — OXYTOCIN-SODIUM CHLORIDE 30-0.9 UT/500ML-% IV SOLN
1.0000 m[IU]/min | INTRAVENOUS | Status: DC
  Administered 2021-03-20: 2 m[IU]/min via INTRAVENOUS
  Filled 2021-03-20: qty 500

## 2021-03-20 MED ORDER — TERBUTALINE SULFATE 1 MG/ML IJ SOLN: 0.2500 mg | Freq: Once | INTRAMUSCULAR | Status: DC | PRN

## 2021-03-20 MED ORDER — MISOPROSTOL 50MCG HALF TABLET
50.0000 ug | ORAL_TABLET | ORAL | Status: DC | PRN
  Administered 2021-03-20: 50 ug via BUCCAL
  Filled 2021-03-20: qty 1

## 2021-03-20 MED ORDER — TETANUS-DIPHTH-ACELL PERTUSSIS 5-2.5-18.5 LF-MCG/0.5 IM SUSY: 0.5000 mL | PREFILLED_SYRINGE | Freq: Once | INTRAMUSCULAR | Status: DC

## 2021-03-20 MED ORDER — SIMETHICONE 80 MG PO CHEW: 80.0000 mg | CHEWABLE_TABLET | ORAL | Status: DC | PRN

## 2021-03-20 MED ORDER — LACTATED RINGERS IV SOLN: 500.0000 mL | Freq: Once | INTRAVENOUS | Status: DC

## 2021-03-20 MED ORDER — ONDANSETRON HCL 4 MG/2ML IJ SOLN: 4.0000 mg | INTRAMUSCULAR | Status: DC | PRN

## 2021-03-20 MED ORDER — DIPHENHYDRAMINE HCL 25 MG PO CAPS: 25.0000 mg | ORAL_CAPSULE | Freq: Four times a day (QID) | ORAL | Status: DC | PRN

## 2021-03-20 MED ORDER — LIDOCAINE HCL (PF) 1 % IJ SOLN: 30.0000 mL | INTRAMUSCULAR | Status: DC | PRN

## 2021-03-20 MED ORDER — IBUPROFEN 600 MG PO TABS
600.0000 mg | ORAL_TABLET | Freq: Four times a day (QID) | ORAL | Status: DC
  Administered 2021-03-20 – 2021-03-21 (×4): 600 mg via ORAL
  Filled 2021-03-20 (×4): qty 1

## 2021-03-20 MED ORDER — OXYCODONE-ACETAMINOPHEN 5-325 MG PO TABS: 2.0000 | ORAL_TABLET | ORAL | Status: DC | PRN

## 2021-03-20 MED ORDER — LACTATED RINGERS IV SOLN: 500.0000 mL | INTRAVENOUS | Status: DC | PRN

## 2021-03-20 MED ORDER — COCONUT OIL OIL: 1.0000 "application " | TOPICAL_OIL | Status: DC | PRN

## 2021-03-20 MED ORDER — ACETAMINOPHEN 325 MG PO TABS
650.0000 mg | ORAL_TABLET | ORAL | Status: DC | PRN
  Administered 2021-03-21: 650 mg via ORAL
  Filled 2021-03-20: qty 2

## 2021-03-20 MED ORDER — LIDOCAINE HCL (PF) 1 % IJ SOLN
INTRAMUSCULAR | Status: DC | PRN
  Administered 2021-03-20 (×2): 5 mL via EPIDURAL
  Administered 2021-03-20: 3 mL via EPIDURAL

## 2021-03-20 MED ORDER — BENZOCAINE-MENTHOL 20-0.5 % EX AERO
1.0000 "application " | INHALATION_SPRAY | CUTANEOUS | Status: DC | PRN
  Administered 2021-03-20: 1 via TOPICAL
  Filled 2021-03-20: qty 56

## 2021-03-20 MED ORDER — OXYTOCIN BOLUS FROM INFUSION
333.0000 mL | Freq: Once | INTRAVENOUS | Status: AC
  Administered 2021-03-20: 333 mL via INTRAVENOUS

## 2021-03-20 MED ORDER — EPHEDRINE 5 MG/ML INJ: 10.0000 mg | INTRAVENOUS | Status: DC | PRN

## 2021-03-20 MED ORDER — SOD CITRATE-CITRIC ACID 500-334 MG/5ML PO SOLN: 30.0000 mL | ORAL | Status: DC | PRN

## 2021-03-20 MED ORDER — OXYCODONE-ACETAMINOPHEN 5-325 MG PO TABS: 1.0000 | ORAL_TABLET | ORAL | Status: DC | PRN

## 2021-03-20 MED ORDER — ACETAMINOPHEN 325 MG PO TABS: 650.0000 mg | ORAL_TABLET | ORAL | Status: DC | PRN

## 2021-03-20 MED ORDER — ZOLPIDEM TARTRATE 5 MG PO TABS: 5.0000 mg | ORAL_TABLET | Freq: Every evening | ORAL | Status: DC | PRN

## 2021-03-20 MED ORDER — LACTATED RINGERS IV SOLN: INTRAVENOUS | Status: DC

## 2021-03-20 MED ORDER — OXYTOCIN-SODIUM CHLORIDE 30-0.9 UT/500ML-% IV SOLN: 2.5000 [IU]/h | INTRAVENOUS | Status: DC

## 2021-03-20 MED ORDER — FENTANYL CITRATE (PF) 100 MCG/2ML IJ SOLN
50.0000 ug | INTRAMUSCULAR | Status: DC | PRN
  Administered 2021-03-20: 100 ug via INTRAVENOUS
  Filled 2021-03-20: qty 2

## 2021-03-20 MED ORDER — DIBUCAINE (PERIANAL) 1 % EX OINT: 1.0000 "application " | TOPICAL_OINTMENT | CUTANEOUS | Status: DC | PRN

## 2021-03-20 NOTE — Progress Notes (Signed)
S: Patient in bed feeling ctxs, but still coping well. Significant other at bedside and supportive. Anticipating the arrival of baby girl.   O: Vitals:   03/20/21 0932 03/20/21 1003 03/20/21 1031 03/20/21 1101  BP: 109/67 (!) 107/52 112/62 116/77  Pulse: 66 88 87 80  Resp:  17  16  Temp:  98.1 F (36.7 C)    TempSrc:  Oral    SpO2:      Weight:      Height:        FHT:  FHR: 135 bpm, variability: moderate,  accelerations:  Present,  decelerations:  Absent UC:   irregular, every 1-5 minutes SVE:   Dilation: 3 Effacement (%): 70 Station: -3 Exam by:: Dr. Annia Friendly  A / P:  25 y.o. G2P1001 at [redacted]w[redacted]d here for IOL due to elevated BMI and mild polyhydramnios (AFI 26), progressing normally on Pitocin  -Discussed AROM for augmentation and patient agrees with the plan -GBS negative Fetal Wellbeing:  Category I Pain Control:  IV pain meds Anticipated MOD:  NSVD  Trellis Moment, SNM 03/20/2021, 11:20 AM

## 2021-03-20 NOTE — Progress Notes (Signed)
Labor Progress Note Tina Keller is a 26 y.o. G2P1001 at [redacted]w[redacted]d presented for IOL due to mild polyhydramnios.   S: Doing well, feeling some cramping.   O:  BP (!) 105/55    Pulse 95    Temp 98.2 F (36.8 C) (Oral)    Ht 5\' 8"  (1.727 m)    Wt 128.3 kg    LMP 06/20/2020 (Exact Date)    SpO2 96%    BMI 43.01 kg/m  EFM: 135/mod /none/none  CVE: Dilation: 3 Effacement (%): 70 Station: -3 Presentation: Vertex Exam by:: Dr. [email or web address]   A&P: 26 y.o. G2P1001 [redacted]w[redacted]d  #Labor: Progressing well with difficult cervical exam due to very posterior positioning. Plan to transition to pit 2x2 and assess for AROM when station has improved.  #Pain: IV fent PRN, hoping to avoid epidural #FWB: Cat I  #GBS negative  [redacted]w[redacted]d, DO 5:30 AM

## 2021-03-20 NOTE — Anesthesia Preprocedure Evaluation (Signed)
Anesthesia Evaluation  Patient identified by MRN, date of birth, ID band Patient awake    Reviewed: Allergy & Precautions, Patient's Chart, lab work & pertinent test results  Airway Mallampati: II  TM Distance: >3 FB Neck ROM: Full    Dental no notable dental hx.    Pulmonary former smoker,    Pulmonary exam normal breath sounds clear to auscultation       Cardiovascular negative cardio ROS Normal cardiovascular exam Rhythm:Regular Rate:Normal     Neuro/Psych negative neurological ROS  negative psych ROS   GI/Hepatic Neg liver ROS, GERD  ,  Endo/Other  Morbid obesity  Renal/GU negative Renal ROS  negative genitourinary   Musculoskeletal negative musculoskeletal ROS (+)   Abdominal (+) + obese,   Peds  Hematology  (+) Blood dyscrasia, anemia ,   Anesthesia Other Findings   Reproductive/Obstetrics (+) Pregnancy                             Anesthesia Physical Anesthesia Plan  ASA: 3  Anesthesia Plan: Epidural   Post-op Pain Management:    Induction:   PONV Risk Score and Plan:   Airway Management Planned: Natural Airway  Additional Equipment:   Intra-op Plan:   Post-operative Plan:   Informed Consent: I have reviewed the patients History and Physical, chart, labs and discussed the procedure including the risks, benefits and alternatives for the proposed anesthesia with the patient or authorized representative who has indicated his/her understanding and acceptance.       Plan Discussed with: Anesthesiologist  Anesthesia Plan Comments:         Anesthesia Quick Evaluation

## 2021-03-20 NOTE — Lactation Note (Addendum)
This note was copied from a baby's chart. Lactation Consultation Note  Patient Name: Tina Keller [id number] Date: 03/20/2021 Reason for consult: L&D Initial assessment;Term;Mother's request;RN request;Breastfeeding assistance;Other (Comment) (LC LD visit less than 60 mins/ LD RN Harmony Stone called for [facility] assist due to mom changing her mind for Latch assist. Baby STS, latched w/ assist from RN, released . LC offered to assist to latch,mom receptive. Working on depth Land, swallows.) Age: 57 mins / P 2 - experienced BF of 3 months with her 1st baby ( now 27 years old )  Mom feeding preference if breast / formula.  LC encouraged mom to give baby lots of practice latching.  Mom aware she will be seen again later by [facility].   Maternal Data Does the patient have breastfeeding experience prior to this delivery?: Yes How long did the patient breastfeed?: pert mom BF her 1st - 3 months ( now 4 years old )  Feeding Mother's Current Feeding Choice: Breast Milk and Formula  LATCH Score Latch: Repeated attempts needed to sustain latch, nipple held in mouth throughout feeding, stimulation needed to elicit sucking reflex.  Audible Swallowing: Spontaneous and intermittent  Type of Nipple: Everted at rest and after stimulation (some areola edema / and with reserve pressure, baby able to latch with depth and per mom ccomfortable compared to the 1st latch.)  Comfort (Breast/Nipple): Soft / non-tender  Hold (Positioning): Assistance needed to correctly position infant at breast and maintain latch.  LATCH Score: 8   Lactation Tools Discussed/Used    Interventions Interventions: Breast feeding basics reviewed;Assisted with latch;Skin to skin;Breast compression;Reverse pressure;Adjust position;Support pillows;Position options;Education  Discharge    Consult Status Consult Status: Follow-up from L&D Date: 03/20/21 Follow-up type: In-patient    Matilde Sprang Vennessa Affinito 03/20/2021, 3:14  PM

## 2021-03-20 NOTE — Anesthesia Procedure Notes (Signed)
Epidural Patient location during procedure: OB Start time: 03/20/2021 12:29 PM End time: 03/20/2021 12:36 PM  Preanesthetic Checklist Completed: patient identified, IV checked, site marked, risks and benefits discussed, surgical consent, monitors and equipment checked, pre-op evaluation and timeout performed  Epidural Patient position: sitting Prep: DuraPrep and site prepped and draped Patient monitoring: continuous pulse ox and blood pressure Approach: midline Location: L3-L4 Injection technique: LOR air  Needle:  Needle type: Tuohy  Needle gauge: 17 G Needle length: 9 cm and 9 Needle insertion depth: 8 cm Catheter type: closed end flexible Catheter size: 19 Gauge Catheter at skin depth: 14 cm Test dose: negative and Other  Assessment Events: blood not aspirated, injection not painful, no injection resistance, no paresthesia and negative IV test  Additional Notes Patient identified. Risks and benefits discussed including failed block, incomplete  Pain control, post dural puncture headache, nerve damage, paralysis, blood pressure Changes, nausea, vomiting, reactions to medications-both toxic and allergic and post Partum back pain. All questions were answered. Patient expressed understanding and wished to proceed. Sterile technique was used throughout procedure. Epidural site was Dressed with sterile barrier dressing. No paresthesias, signs of intravascular injection Or signs of intrathecal spread were encountered.  Patient was more comfortable after the epidural was dosed. Please see RN's note for documentation of vital signs and FHR which are stable. Reason for block:procedure for pain

## 2021-03-20 NOTE — H&P (Signed)
OBSTETRIC ADMISSION HISTORY AND PHYSICAL  Tina Keller is a 26 y.o. female G2P1001 with IUP at [redacted]w[redacted]d by LMP presenting for IOL due to elevated BMI/Mild polyhydramnios. She reports +FMs, No LOF, no VB, no blurry vision, headaches or peripheral edema, and RUQ pain.  She plans on formula feeding. She is planning for condoms for birth control. She received her prenatal care at  [facility]    Dating: By LMP --->  Estimated Date of Delivery: 03/27/21  Sono:    '@[redacted]w[redacted]d'$ , CWD, normal anatomy, cephalic presentation, 1610R, 47% EFW   Prenatal History/Complications:  --Mild polyhydramnios  --Rubella non-immune  --Chlamydia positive on 2/1, treated  --Elevated BMI (current BMI 42)   Past Medical History: Past Medical History:  Diagnosis Date   Chlamydia infection 03/08/2021   Encounter for supervision of normal first pregnancy in second trimester 09/02/2015    Clinic [facility] Prenatal Labs Dating  LMP Blood type: O/POS/-- (03/31 1427)  Genetic Screen 1 Screen:    AFP:     Quad: Normal    NIPS: Antibody:NEG (03/31 1427) Anatomic [place]  Repeat U/S normal  Rubella: <0.90 (03/31 1427) GTT Early: 85             Third trimester:  RPR: NON REAC (08/25 1336)  Flu vaccine  Declined  HBsAg: NEGATIVE (03/31 1427)  TDaP vaccine  Given                          Rhogam: N/A HIV: NONREACTIVE (08/25 1336)  Baby Food  Breast and Bottle                                     GBS: (For PCN allergy, check sensitivities)  Positive  Contraception  Undecided  Pap: Circumcision  Yes, [facility]   Pediatrician   Support Person       NSVD (normal spontaneous vaginal delivery) 12/25/2015   Positive GBS test 12/09/2015   Rubella non-immune status, antepartum 12/01/2015   Needs MMR post partum    Past Surgical History: Past Surgical History:  Procedure Laterality Date   NO PAST SURGERIES      Obstetrical History: OB History     Gravida  2   Para  1   Term  1   Preterm      AB      Living  1      SAB      IAB       Ectopic      Multiple  0   Live Births  1           Social History Social History   Socioeconomic History   Marital status: Single    Spouse name: Not on file   Number of children: Not on file   Years of education: Not on file   Highest education level: Not on file  Occupational History   Not on file  Tobacco Use   Smoking status: Former    Types: Cigarettes    Quit date: 05/25/2020    Years since quitting: 0.8   Smokeless tobacco: Never  Vaping Use   Vaping Use: Never used  Substance and Sexual Activity   Alcohol use: Not Currently    Comment: not while pregnant   Drug use: No   Sexual activity: Yes    Birth control/protection: Condom  Other Topics Concern   Not  on file  Social History Narrative   Not on file   Social Determinants of Health   Financial Resource Strain: Not on file  Food Insecurity: Not on file  Transportation Needs: Not on file  Physical Activity: Not on file  Stress: Not on file  Social Connections: Not on file    Family History: Family History  Problem Relation Age of Onset   Healthy Mother    Healthy Father     Allergies: No Known Allergies  Medications Prior to Admission  Medication Sig Dispense Refill Last Dose   aspirin EC 81 MG tablet Take 1 tablet (81 mg total) by mouth daily. Swallow whole. 30 tablet 11 03/19/2021   ferrous sulfate 325 (65 FE) MG EC tablet Take 1 tablet (325 mg total) by mouth every other day. 60 tablet 2 03/19/2021   Prenatal Vit-Fe Fumarate-FA (PRENATAL MULTIVITAMIN) TABS tablet Take 1 tablet by mouth daily at 12 noon.   03/19/2021     Review of Systems   All systems reviewed and negative except as stated in HPI  Blood pressure 117/68, pulse (!) 104, temperature 98.2 F (36.8 C), temperature source Oral, height $RemoveBefo'5\' 8"'MulOvBOpZIb$  (1.727 m), weight 128.3 kg, last menstrual period 06/20/2020, SpO2 96 %. General appearance: alert, cooperative, and no distress Lungs: Normal WOB Heart: regular rate Abdomen: soft,  non-tender Pelvic: NEFG Extremities: Homans sign is negative, no sign of DVT Presentation: cephalic (confirmed with BSUS) Fetal monitoringBaseline: 135 bpm, Variability: Good {> 6 bpm), Accelerations: Reactive, and Decelerations: Absent Uterine activityNone Dilation: Closed Effacement (%): Thick Station: Ballotable Exam by:: Dr. Higinio Plan   Prenatal labs: ABO, Rh: --/--/PENDING (02/13 0040) Antibody: PENDING (02/13 0040) Rubella: <0.90 (06/15 1504) RPR: Non Reactive (12/15 1211)  HBsAg: Negative (06/15 1504)  HIV: Non Reactive (12/15 1211)  GBS: Negative/-- (02/01 1227)  1 hr Glucola normal  Genetic screening  LR NIPS Anatomy US normal   Prenatal Transfer Tool  Maternal Diabetes: No Genetic Screening: Normal Maternal Ultrasounds/Referrals: Normal Fetal Ultrasounds or other Referrals:  None Maternal Substance Abuse:  No Significant Maternal Medications:  None Significant Maternal Lab Results: Group B Strep negative  Results for orders placed or performed during the hospital encounter of 03/20/21 (from the past 24 hour(s))  CBC   Collection Time: 03/20/21 12:32 AM  Result Value Ref Range   WBC 7.7 4.0 - 10.5 K/uL   RBC 4.10 3.87 - 5.11 MIL/uL   Hemoglobin 12.0 12.0 - 15.0 g/dL   HCT 36.4 36.0 - 46.0 %   MCV 88.8 80.0 - 100.0 fL   MCH 29.3 26.0 - 34.0 pg   MCHC 33.0 30.0 - 36.0 g/dL   RDW 13.4 11.5 - 15.5 %   Platelets 172 150 - 400 K/uL   nRBC 0.0 0.0 - 0.2 %  Type and screen   Collection Time: 03/20/21 12:40 AM  Result Value Ref Range   ABO/RH(D) PENDING    Antibody Screen PENDING    Sample Expiration      03/23/2021,2359 Performed at [facility], [place] [address] [place], [place] [address]     Patient Active Problem List   Diagnosis Date Noted   Iron deficiency anemia of pregnancy 01/20/2021   Polyhydramnios in third trimester 01/20/2021   History of UTI 01/20/2021   Rubella non-immune status, antepartum 01/16/2021   Obesity in pregnancy, antepartum,  third trimester 01/16/2021   Poor weight gain of pregnancy 09/02/2020   Supervision of normal pregnancy 09/02/2015    Assessment/Plan:  [employer]  is a 26 y.o. G2P1001 at [redacted]w[redacted]d here for IOL due to elevated BMI and mild polyhydramnios.   #Labor: Start with buccal cytotec. Plan for FB when able to, hopefully on next check.  #Pain: PRN #FWB: Cat I  #ID: GBS negative  #MOF: Formula feeding  #MOC: Condoms  #Circ: NA (female)   #Mild polyhydramnios: AFI 26 on 1/25 [place]. Low risk genetic screening and anatomy otherwise WNL.   #Rubella non-immune: Offer MMR pp.   Patriciaann Clan, DO  03/20/2021, 1:27 AM

## 2021-03-20 NOTE — Discharge Summary (Signed)
Postpartum Discharge Summary  Date of Service updated     Patient Name: Tina Keller DOB: Jul 10, 1995 MRN: [id number]  Date of admission: 03/20/2021 Delivery date:03/20/2021  Delivering provider: Truett Mainland  Date of discharge: 03/21/2021  Admitting diagnosis: Polyhydramnios [O40.9XX0] Intrauterine pregnancy: [redacted]w[redacted]d     Secondary diagnosis:  Principal Problem:   Vaginal delivery Active Problems:   Supervision of normal pregnancy   Rubella non-immune status, antepartum   Obesity in pregnancy, antepartum, third trimester   Iron deficiency anemia of pregnancy   Polyhydramnios in third trimester  Additional problems: None    Discharge diagnosis: Term Pregnancy Delivered                                              Post partum procedures: None Augmentation: AROM, Pitocin, and Cytotec Complications: None  Hospital course: Induction of Labor With Vaginal Delivery   26 y.o. yo G2P1001 at [redacted]w[redacted]d was admitted to the hospital 03/20/2021 for induction of labor.  Indication for induction:  Polyhydramnios .  Patient had an uncomplicated labor course and progressed to complete after Cytotec, Pitocin, and AROM. She had an uncomplicated vaginal delivery.  Membrane Rupture Time/Date: 11:17 AM ,03/20/2021   Delivery Method:Vaginal, Spontaneous  Episiotomy: None  Lacerations:  None  Details of delivery can be found in separate delivery note.  Patient had a routine postpartum course. Patient is discharged home 03/21/21.  Newborn Data: Birth date:03/20/2021  Birth time:2:13 PM  Gender:Female  Living status:Living  Apgars:8 ,9  Weight:3055 g   Magnesium Sulfate received: No BMZ received: No Rhophylac: N/A MMR: Offered postpartum  T-DaP: Given prenatally Flu: No Transfusion: No   Physical exam  Vitals:   03/20/21 1615 03/20/21 1725 03/20/21 2145 03/21/21 0513  BP: 114/66 112/63 112/60 126/77  Pulse: (!) 57 61 62 69  Resp: $Remo'17 16 17 16  'viqFz$ Temp: 98.5 F (36.9 C) 98.4 F (36.9 C)  98.6 F (37 C) 97.9 F (36.6 C)  TempSrc: Oral Oral Oral Oral  SpO2:  100% 99% 100%  Weight:      Height:       General: alert, cooperative, and no distress Lochia: appropriate Uterine Fundus: firm Incision: N/A DVT Evaluation: No significant calf/ankle edema.  Labs: Lab Results  Component Value Date   WBC 7.7 03/20/2021   HGB 12.0 03/20/2021   HCT 36.4 03/20/2021   MCV 88.8 03/20/2021   PLT 172 03/20/2021   CMP Latest Ref Rng & Units 11/13/2019  Glucose 65 - 99 mg/dL 93  BUN 6 - 20 mg/dL 13  Creatinine 0.57 - 1.00 mg/dL 0.74  Sodium 134 - 144 mmol/L 141  Potassium 3.5 - 5.2 mmol/L 4.0  Chloride 96 - 106 mmol/L 104  CO2 20 - 29 mmol/L 22  Calcium 8.7 - 10.2 mg/dL 9.7  Total Protein 6.0 - 8.5 g/dL 7.1  Total Bilirubin 0.0 - 1.2 mg/dL 0.3  Alkaline Phos 44 - 121 IU/L 44  AST 0 - 40 IU/L 15  ALT 0 - 32 IU/L 15   Edinburgh Score: Edinburgh Postnatal Depression Scale Screening Tool 03/20/2021  I have been able to laugh and see the funny side of things. 0  I have looked forward with enjoyment to things. 0  I have blamed myself unnecessarily when things went wrong. 0  I have been anxious or worried for no good reason. 0  I have felt scared or panicky for no good reason. 0  Things have been getting on top of me. 0  I have been so unhappy that I have had difficulty sleeping. 0  I have felt sad or miserable. 0  I have been so unhappy that I have been crying. 0  The thought of harming myself has occurred to me. 0  Edinburgh Postnatal Depression Scale Total 0     After visit meds:  Allergies as of 03/21/2021   No Known Allergies      Medication List     STOP taking these medications    aspirin EC 81 MG tablet   prenatal multivitamin Tabs tablet       TAKE these medications    acetaminophen 325 MG tablet Commonly known as: Tylenol Take 2 tablets (650 mg total) by mouth every 4 (four) hours as needed (for pain scale < 4).   ferrous sulfate 325 (65 FE) MG  EC tablet Take 1 tablet (325 mg total) by mouth every other day.   ibuprofen 600 MG tablet Commonly known as: ADVIL Take 1 tablet (600 mg total) by mouth every 6 (six) hours.         Discharge home in stable condition Infant Feeding: Breast Infant Disposition:home with mother Discharge instruction: per After Visit Summary and Postpartum booklet. Activity: Advance as tolerated. Pelvic rest for 6 weeks.  Diet: routine diet Future Appointments:No future appointments. Follow up Visit: Message sent to [facility] by Dr. Gwenlyn Perking on 03/20/21.   Please schedule this patient for a In person postpartum visit in 6 weeks with the following provider: Any provider. Additional Postpartum F/U: None   Low risk pregnancy complicated by:  Mild polyhydramnios Delivery mode:  Vaginal, Spontaneous  Anticipated Birth Control:  Condoms  03/21/2021 Patriciaann Clan, DO

## 2021-03-21 LAB — TYPE AND SCREEN
ABO/RH(D): O POS
Antibody Screen: NEGATIVE

## 2021-03-21 MED ORDER — IBUPROFEN 600 MG PO TABS: 600.0000 mg | ORAL_TABLET | Freq: Four times a day (QID) | ORAL | 0 refills | Status: DC

## 2021-03-21 MED ORDER — ACETAMINOPHEN 325 MG PO TABS: 650.0000 mg | ORAL_TABLET | ORAL | Status: DC | PRN

## 2021-03-21 NOTE — Anesthesia Postprocedure Evaluation (Signed)
Anesthesia Post Note  Patient: Catering manager  Procedure(s) Performed: AN AD HOC LABOR EPIDURAL     Patient location during evaluation: Mother Baby Anesthesia Type: Epidural Level of consciousness: awake and alert and oriented Pain management: satisfactory to patient Vital Signs Assessment: post-procedure vital signs reviewed and stable Respiratory status: respiratory function stable Cardiovascular status: stable Postop Assessment: no headache, no backache, epidural receding, patient able to bend at knees, no signs of nausea or vomiting, adequate PO intake and able to ambulate Anesthetic complications: no   No notable events documented.  Last Vitals:  Vitals:   03/20/21 2145 03/21/21 0513  BP: 112/60 126/77  Pulse: 62 69  Resp: 17 16  Temp: 37 C 36.6 C  SpO2: 99% 100%    Last Pain:  Vitals:   03/21/21 0513  TempSrc: Oral  PainSc: 4    Pain Goal:                   Tina Keller

## 2021-03-21 NOTE — Progress Notes (Signed)
Pt is non immune to rubella. Pt declines MMR.

## 2021-03-22 ENCOUNTER — Ambulatory Visit: Payer: BC Managed Care – PPO

## 2021-03-30 ENCOUNTER — Telehealth (HOSPITAL_COMMUNITY): Payer: Self-pay | Admitting: *Deleted

## 2021-03-30 NOTE — Telephone Encounter (Signed)
Mom reports feeling good. No concerns about herself at this time. EPDS=2(Hospital score=0) Mom reports baby is doing well. Feeding, peeing, and pooping without difficulty. Safe sleep reviewed. Mom reports no concerns about baby at present.  Duffy Rhody, RN 03-30-2021 at 10:24am

## 2021-04-17 ENCOUNTER — Ambulatory Visit: Payer: BC Managed Care – PPO

## 2021-05-04 ENCOUNTER — Encounter: Payer: Self-pay | Admitting: Family Medicine

## 2021-05-05 ENCOUNTER — Other Ambulatory Visit (HOSPITAL_COMMUNITY)
Admission: RE | Admit: 2021-05-05 | Discharge: 2021-05-05 | Disposition: A | Discharge status: Home / Self Care | Payer: BC Managed Care – PPO | Source: Ambulatory Visit | Attending: Family Medicine | Admitting: Family Medicine

## 2021-05-05 ENCOUNTER — Ambulatory Visit (INDEPENDENT_AMBULATORY_CARE_PROVIDER_SITE_OTHER): Payer: BC Managed Care – PPO | Admitting: Family Medicine

## 2021-05-05 ENCOUNTER — Encounter: Payer: Self-pay | Admitting: Family Medicine

## 2021-05-05 DIAGNOSIS — Z23 Encounter for immunization: Secondary | ICD-10-CM | POA: Diagnosis not present

## 2021-05-05 DIAGNOSIS — Z114 Encounter for screening for human immunodeficiency virus [HIV]: Secondary | ICD-10-CM | POA: Diagnosis not present

## 2021-05-05 DIAGNOSIS — Z113 Encounter for screening for infections with a predominantly sexual mode of transmission: Secondary | ICD-10-CM

## 2021-05-05 DIAGNOSIS — Z111 Encounter for screening for respiratory tuberculosis: Secondary | ICD-10-CM | POA: Diagnosis not present

## 2021-05-05 NOTE — Patient Instructions (Signed)
Preventing Sexually Transmitted Infections, Adult  Sexually transmitted infections (STIs) are diseases that are spread from person to person (are contagious). They are spread, or transmitted, through bodily fluids exchanged during sex or sexual contact. These bodily fluids include saliva, semen, blood, vaginal mucus, and urine. STIs are very common among people of all ages.  Some common STIs include:  Herpes.  Hepatitis B.  Chlamydia.  Gonorrhea.  Syphilis.  HPV (human papillomavirus).  HIV, also called the human immunodeficiency virus. This is the virus that can cause AIDS (acquired immunodeficiency syndrome).  Often, people who have these STIs do not have symptoms. Even without symptoms, these infections can be spread from person to person and require treatment.  How can these conditions affect me?  STIs can be treated, and many STIs can be cured. However, some STIs cannot be cured and will affect you for the rest of your life.  Certain STIs may:  Require you to take medicine for the rest of your life.  Affect your ability to have children (your fertility).  Increase your risk for developing another STI or certain serious health conditions. These may include:  Cervical cancer.  Head and neck cancer.  Pelvic inflammatory disease (PID), in women.  Organ damage or damage to other parts of your body, if the infection spreads.  Cause problems during pregnancy and may be transmitted to the baby during the pregnancy or childbirth.  What can increase my risk?  You may have an increased risk for developing an STI if:  You have unprotected sex. Sex includes oral, vaginal, or anal sex.  You have more than one sex partner.  You have a sex partner who has multiple sex partners.  You have sex with someone who has an STI.  You have an STI or you had an STI before.  You inject drugs or have a sex partner or partners who inject drugs.  What actions can I take to prevent STIs?  The only way to completely prevent STIs is not to have  sex of any kind. This is called practicing abstinence. If you are sexually active, you can protect yourself and others by taking these actions to lower your risk of getting an STI:  Lifestyle  Avoid mixing alcohol, drugs, and sex. Alcohol and drug use can affect your ability to make good decisions and can lead to risky sexual behaviors.  Medicines  Ask your health care provider about taking pre-exposure prophylaxis (PrEP) to prevent HIV infection.  General information    Stay up to date on vaccinations. Certain vaccines can lower your risk of getting certain STIs, such as:  Hepatitis A and hepatitis B vaccines. You may have been vaccinated as a young child, but you will likely need a booster shot as a teen or young adult.  HPV (human papillomavirus) vaccine.  Have only one sex partner (be monogamous) or limit the number of sex partners you have.  Use methods that prevent the exchange of body fluids between partners (barrier protection) correctly every time you have sex. Barrier protection can be used during oral, vaginal, or anal sex. Commonly used barrier methods include:  Female condom.  Female condom.  Dental dam.  Use a new condom for every sex act from start to finish.  Get tested for STIs. Have your partners get tested, too.  If you test positive for an STI, follow recommendations from your health care provider about treatment and make sure your sex partners are tested and treated.  Birth control   pills, injections, implants, and intrauterine devices (IUDs) do not protect against STIs. To prevent both STIs and pregnancy, always use a condom with another form of birth control.  Some STIs, such as herpes, are spread through skin-to-skin contact. A condom may not protect you from getting such STIs. Avoid all sexual contact if you or your partners have herpes and there is an active flare with open sores.  Where to find more information  Learn more about STIs from:  Centers for Disease Control and Prevention:  More  information about specific STIs: cdc.gov/std  Places to get sexual health counseling and treatment for free or at a low cost: gettested.cdc.gov  U.S. Department of Health and Human Services: www.womenshealth.gov  Summary  Sexually transmitted infections (STIs) can spread through exchanging bodily fluids during sexual contact. Fluids include saliva, semen, blood, vaginal mucus, and urine.  You may have an increased risk for developing an STI if you have unprotected sex. Sex includes oral, vaginal, or anal sex.  If you do have sex, limit your number of sex partners and use barrier protection every time you have sex.  This information is not intended to replace advice given to you by your health care provider. Make sure you discuss any questions you have with your health care provider.  Document Revised: 03/09/2019 Document Reviewed: 03/09/2019  Elsevier Patient Education  2022 Elsevier Inc.

## 2021-05-05 NOTE — Progress Notes (Signed)
? ? ?  Subjective:  ? Postpartum exam: ? Tina Keller is a 26 y.o. female who presents for a postpartum visit. She is 6 weeks postpartum following a spontaneous vaginal delivery. I have fully reviewed the prenatal and intrapartum course. The delivery was at 53 gestational weeks. Outcome: spontaneous vaginal delivery. Anesthesia: epidural. Postpartum course has been uneventful. Baby's course has been healthy.She breastfed baby up to 6 weeks and now baby is bottle fed. Bleeding No bleeding currently, LMP 04/29/21 and ended today. Bowel function is normal. Bladder function is normal. Patient is sexually active. Contraception method is condoms. Postpartum depression screening: negative. ? ?Work screening: ?She resumes work soon and would like to get TB screening done. ?No respiratory symptoms. ? ?The following portions of the patient's history were reviewed and updated as appropriate: allergies, current medications, past family history, past medical history, past social history, past surgical history, and problem list. ? ?Review of Systems ?Pertinent items noted in HPI and remainder of comprehensive ROS otherwise negative.  ? ?Objective:  ? ? BP 120/64   Pulse 68   Ht $R'5\' 8"'gJ$  (1.727 m)   Wt 270 lb 8 oz (122.7 kg)   LMP 04/30/2021 (Exact Date)   SpO2 98%   BMI 41.13 kg/m?   ?General:  alert and cooperative  ? Breasts:  Not examined  ?Lungs: clear to auscultation bilaterally  ?Heart:  regular rate and rhythm, S1, S2 normal, no murmur, click, rub or gallop  ?Abdomen: soft, non-tender; bowel sounds normal; no masses,  no organomegaly  ? Vulva:  normal  ?Vagina: normal vagina  ?Cervix:  no cervical motion tenderness and no lesions  ?Corpus: normal  ?Adnexa:  normal adnexa  ?Rectal Exam: Not performed.  ?      ?Assessment:  ? ? 6 weeks postpartum exam. Pap smear not done at today's visit.  ?She [place] up to date with her PAP ? ?Plan:  ? ? 1. Contraception: discussed hormonal contraceptive. She declined and prefers condoms for  now. ?2. STD screening completed at her request - HIV, RPR, GC, Chlamydia, and trichomonas ?3. Follow up  as needed.  ?4. Quatiferone gold ordered for TB screening. I will contact her with results. ?5. She declined COVID19 and flu shots. MMR given due to her rubella nonimmune status. ? ?More than 50% of this 30 minutes face to face encounter was spent on counseling and coordination of care. ? ? ?Andrena Mews, MD ?[place]  ? ? ?

## 2021-05-08 LAB — CERVICOVAGINAL ANCILLARY ONLY
Chlamydia: POSITIVE — AB
Comment: NEGATIVE
Comment: NEGATIVE
Comment: NORMAL
Neisseria Gonorrhea: NEGATIVE
Trichomonas: NEGATIVE

## 2021-05-09 ENCOUNTER — Telehealth: Payer: Self-pay | Admitting: Family Medicine

## 2021-05-09 MED ORDER — DOXYCYCLINE HYCLATE 100 MG PO TABS
100.0000 mg | ORAL_TABLET | Freq: Two times a day (BID) | ORAL | 0 refills | Status: AC
Start: 2021-05-09 — End: 2021-05-16

## 2021-05-09 NOTE — Telephone Encounter (Signed)
STD result discussed with the patient. ?+Chlamydia. ?Treatment discussed - Doxycycline escribed to her pharmacy. ?I encouraged her to inform her partner to get tested and treated to prevent reinfection. Continue use of condoms for STD preventions. ?She is to return in 8-12 weeks for test of cure. She has no further questions and she was appreciative of the call. ?I will route note to RN clinic to report positive test to the health department. ?

## 2021-05-10 LAB — QUANTIFERON-TB GOLD PLUS
QuantiFERON Mitogen Value: >10 IU/mL
QuantiFERON Nil Value: 0.05 IU/mL
QuantiFERON TB1 Ag Value: 0.04 IU/mL
QuantiFERON TB2 Ag Value: 0.07 IU/mL
QuantiFERON-TB Gold Plus: NEGATIVE

## 2021-05-10 LAB — HIV ANTIBODY (ROUTINE TESTING W REFLEX): HIV Screen 4th Generation wRfx: NONREACTIVE

## 2021-05-10 LAB — RPR: RPR Ser Ql: NONREACTIVE

## 2021-05-10 NOTE — Telephone Encounter (Signed)
STD form faxed and placed in batch scanning.  ?

## 2021-07-11 ENCOUNTER — Encounter: Payer: Self-pay | Admitting: *Deleted

## 2021-10-03 NOTE — Progress Notes (Deleted)
  SUBJECTIVE:   CHIEF COMPLAINT / HPI:   STI check - [id number]  - recently became sexually active with a new partner, wants to be checked for STIs. Previously tested for ***.  - preferred gender of partner: *** - Medications tried: *** - Sexually active with *** *** partner(s) - Last sexual encounter: *** - Contraception: *** Symptoms include: {STISXs:28021}   PERTINENT  PMH / PSH: ***  Past Medical History:  Diagnosis Date   Chlamydia infection 03/08/2021   Encounter for supervision of normal first pregnancy in second trimester 09/02/2015    Clinic [facility] Prenatal Labs Dating  LMP Blood type: O/POS/-- (03/31 1427)  Genetic Screen 1 Screen:    AFP:     Quad: Normal    NIPS: Antibody:NEG (03/31 1427) Anatomic [place]  Repeat U/S normal  Rubella: <0.90 (03/31 1427) GTT Early: 85             Third trimester:  RPR: NON REAC (08/25 1336)  Flu vaccine  Declined  HBsAg: NEGATIVE (03/31 1427)  TDaP vaccine  Given                          Rhogam: N/A HIV: NONREACTIVE (08/25 1336)  Baby Food  Breast and Bottle                                     GBS: (For PCN allergy, check sensitivities)  Positive  Contraception  Undecided  Pap: Circumcision  Yes, [facility]   Pediatrician   Support Person       Iron deficiency anemia of pregnancy 01/20/2021   NSVD (normal spontaneous vaginal delivery) 12/25/2015   Poor weight gain of pregnancy 09/02/2020   Positive GBS test 12/09/2015   Rubella non-immune status, antepartum 12/01/2015   Needs MMR post partum    OBJECTIVE:  There were no vitals taken for this visit.  General: NAD, pleasant, able to participate in exam Cardiac: RRR, no murmurs auscultated Respiratory: CTAB, normal WOB Abdomen: soft, non-tender, non-distended, normoactive bowel sounds Extremities: warm and well perfused, no edema or cyanosis Skin: warm and dry, no rashes noted Neuro: alert, no obvious focal deficits, speech normal Psych: Normal affect and mood  ASSESSMENT/PLAN:  No  problem-specific Assessment & Plan notes found for this encounter.   No orders of the defined types were placed in this encounter.  No orders of the defined types were placed in this encounter.  Reviewed labs and allergies, offered condoms, will check {STIplan:28022} and will call patient with results. Discussed safe sex practices. Will schedule pap if indicated ***. Patient's questions answered to their satisfaction.    No follow-ups on file. Erskine Emery, MD 10/03/2021, 10:52 AM PGY-2, [place] {    This will disappear when note is signed, click to select method of visit    :1}

## 2021-10-04 ENCOUNTER — Ambulatory Visit: Payer: BC Managed Care – PPO

## 2021-10-10 IMAGING — DX DG CHEST 1V PORT
1 series · 1 of 1 positions shown · non-contrast
Comparison: None

CLINICAL DATA: Cough.

EXAM:
PORTABLE CHEST 1 VIEW

[chest ap]
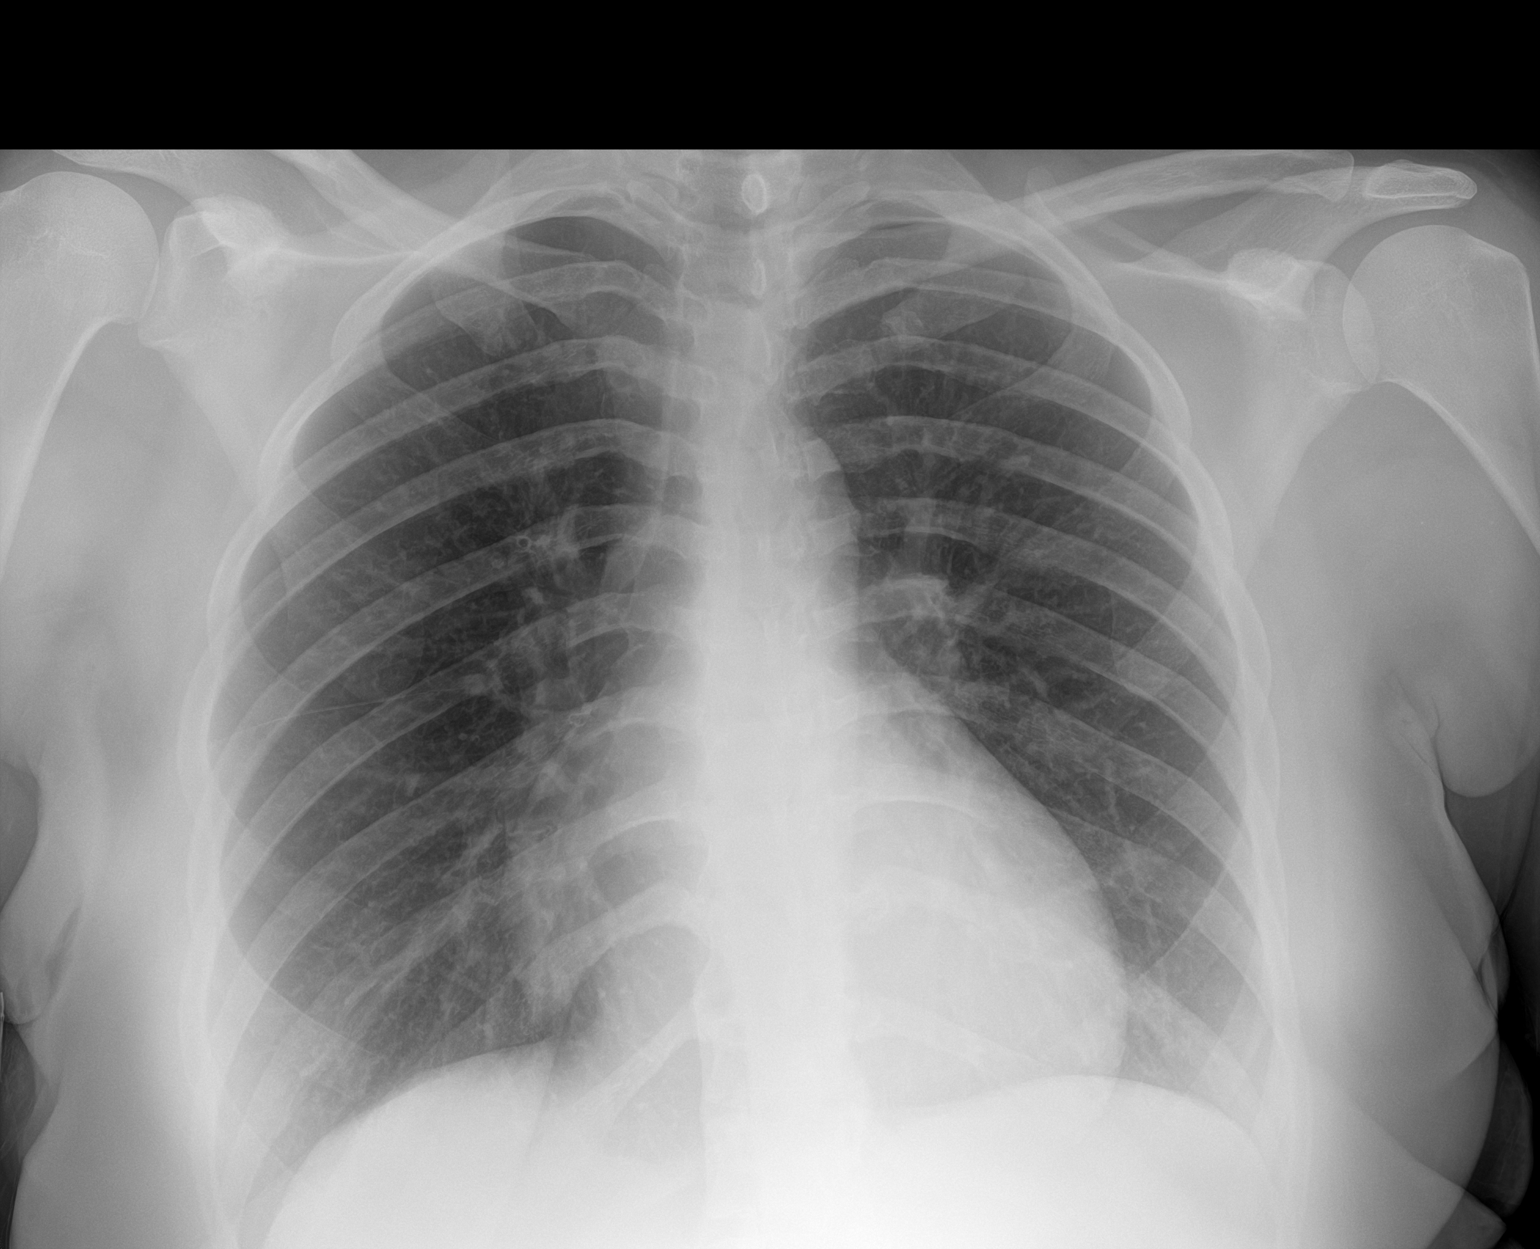

[1 of 1 positions shown; findings below may reference images not displayed]

FINDINGS: The heart size and mediastinal contours are within normal limits.
Both lungs are clear. The visualized skeletal structures are
unremarkable.
IMPRESSION: No active disease.

## 2022-04-17 ENCOUNTER — Encounter: Payer: BC Managed Care – PPO | Admitting: Family Medicine

## 2022-04-24 ENCOUNTER — Ambulatory Visit (INDEPENDENT_AMBULATORY_CARE_PROVIDER_SITE_OTHER): Payer: Self-pay | Admitting: Family Medicine

## 2022-04-24 ENCOUNTER — Encounter: Payer: Self-pay | Admitting: Family Medicine

## 2022-04-24 ENCOUNTER — Other Ambulatory Visit (HOSPITAL_COMMUNITY)
Admission: RE | Admit: 2022-04-24 | Discharge: 2022-04-24 | Disposition: A | Discharge status: Home / Self Care | Payer: Self-pay | Source: Ambulatory Visit | Attending: Family Medicine | Admitting: Family Medicine

## 2022-04-24 VITALS — BP 125/61 | HR 82 | Ht 68.0 in | Wt 288.6 lb

## 2022-04-24 DIAGNOSIS — H6123 Impacted cerumen, bilateral: Secondary | ICD-10-CM

## 2022-04-24 DIAGNOSIS — Z124 Encounter for screening for malignant neoplasm of cervix: Secondary | ICD-10-CM | POA: Insufficient documentation

## 2022-04-24 DIAGNOSIS — Z113 Encounter for screening for infections with a predominantly sexual mode of transmission: Secondary | ICD-10-CM

## 2022-04-24 DIAGNOSIS — Z0000 Encounter for general adult medical examination without abnormal findings: Secondary | ICD-10-CM

## 2022-04-24 NOTE — Progress Notes (Signed)
Subjective:     Tina Keller is a 27 y.o. female and is here for a comprehensive physical exam. The patient reports no problems.  She is doing well with her daughter who is now 30 yrs old. No depression symptoms.  She is working on lifestyle modification for weight loss. She wants to drink more water and reduce fast-food and soda intake.   She did mentioned intermittent B/L ear clogging. Denies hearing loss.   Social History   Socioeconomic History   Marital status: Single    Spouse name: Not on file   Number of children: Not on file   Years of education: Not on file   Highest education level: Not on file  Occupational History   Not on file  Tobacco Use   Smoking status: Former    Types: Cigarettes    Quit date: 05/25/2020    Years since quitting: 1.9   Smokeless tobacco: Never  Vaping Use   Vaping Use: Never used  Substance and Sexual Activity   Alcohol use: Not Currently    Comment: not while pregnant   Drug use: No   Sexual activity: Yes    Birth control/protection: Condom  Other Topics Concern   Not on file  Social History Narrative   Not on file   Social Determinants of Health   Financial Resource Strain: Not on file  Food Insecurity: Not on file  Transportation Needs: Not on file  Physical Activity: Not on file  Stress: Not on file  Social Connections: Not on file  Intimate Partner Violence: Not on file   Health Maintenance  Topic Date Due   INFLUENZA VACCINE  09/05/2021   COVID-19 Vaccine (3 - 2023-24 season) 10/06/2021   PAP-Cervical Cytology Screening  12/08/2021   PAP SMEAR-Modifier  12/08/2021   DTaP/Tdap/Td (4 - Td or Tdap) 01/20/2031   HPV VACCINES  Completed   Hepatitis C Screening  Completed   HIV Screening  Completed    The following portions of the patient's history were reviewed and updated as appropriate: allergies, current medications, past family history, past medical history, past social history, past surgical history, and  problem list.  Review of Systems Pertinent items are noted in HPI.   Objective:    BP 125/61   Pulse 82   Ht 5\' 8"  (1.727 m)   Wt 288 lb 9.6 oz (130.9 kg)   LMP 04/02/2022   SpO2 100%   BMI 43.88 kg/m  General appearance: alert and cooperative Head: Normocephalic, without obvious abnormality, atraumatic Eyes: conjunctivae/corneas clear. PERRL, EOM's intact. Fundi benign. Ears: B/L cerumen impaction, more on the left. Throat: lips, mucosa, and tongue normal; teeth and gums normal Back: symmetric, no curvature. ROM normal. No CVA tenderness. Lungs: clear to auscultation bilaterally Heart: regular rate and rhythm, S1, S2 normal, no murmur, click, rub or gallop Abdomen: soft, non-tender; bowel sounds normal; no masses,  no organomegaly Pelvic: cervix normal in appearance, external genitalia normal, no adnexal masses or tenderness, and vagina normal without discharge Extremities: extremities normal, atraumatic, no cyanosis or edema Skin: Skin color, texture, turgor normal. No rashes or lesions Lymph nodes: Cervical, supraclavicular, and axillary nodes normal. Neurologic: Alert and oriented X 3, normal strength and tone. Normal symmetric reflexes. Normal coordination and gait    Assessment:    Healthy female exam.     Cerumen impaction Plan:   B/L Ear lavage completed. Impaction resolved B/L after lavaging without complications.  May use OTC debrox as needed for cerumen  impaction. PAP and GC/Chlamydia for TOC of Chlamydia infection were checked today. She declined both COVID-19 and flu shots. She declined contraceptives for now.  She denies any concerns for STD exposure, hence HIV and RPR not indicated. I will contact her with her test results. See After Visit Summary for Counseling Recommendations

## 2022-04-24 NOTE — Patient Instructions (Signed)

## 2022-04-25 LAB — CERVICOVAGINAL ANCILLARY ONLY
Chlamydia: NEGATIVE
Comment: NEGATIVE
Comment: NEGATIVE
Comment: NORMAL
Neisseria Gonorrhea: NEGATIVE
Trichomonas: NEGATIVE

## 2022-04-26 ENCOUNTER — Telehealth: Payer: Self-pay | Admitting: Family Medicine

## 2022-04-26 NOTE — Telephone Encounter (Signed)
Negative STD result discussed with her. PAP is pending. I will contact her soon as I have her PAP report.

## 2022-04-30 LAB — CYTOLOGY - PAP
Comment: NEGATIVE
Diagnosis: UNDETERMINED — AB
High risk HPV: NEGATIVE

## 2022-05-01 ENCOUNTER — Encounter: Payer: Self-pay | Admitting: Family Medicine

## 2022-05-01 ENCOUNTER — Telehealth: Payer: Self-pay | Admitting: Family Medicine

## 2022-05-01 NOTE — Telephone Encounter (Signed)
HIPAA compliant callback message left.  Please advise her that her PAP showed mild abnormal changes - Atypical squamous cells of undetermined significance (ASC-US) Abnormal  with negative HPV.  This type of cell change may revert back to normal. The recommendation is to repeat PAP in 3 years.   Follow up soon if you have any questions.  Dr. Gwendlyn Deutscher.

## 2022-05-11 ENCOUNTER — Ambulatory Visit: Payer: Commercial Managed Care - HMO | Admitting: Family Medicine

## 2022-08-13 ENCOUNTER — Ambulatory Visit (INDEPENDENT_AMBULATORY_CARE_PROVIDER_SITE_OTHER): Payer: Commercial Managed Care - HMO

## 2022-08-13 ENCOUNTER — Ambulatory Visit (HOSPITAL_COMMUNITY)
Admission: EM | Admit: 2022-08-13 | Discharge: 2022-08-13 | Disposition: A | Discharge status: Home / Self Care | Payer: Commercial Managed Care - HMO | Attending: Internal Medicine | Admitting: Internal Medicine

## 2022-08-13 ENCOUNTER — Encounter (HOSPITAL_COMMUNITY): Payer: Self-pay

## 2022-08-13 DIAGNOSIS — S86911A Strain of unspecified muscle(s) and tendon(s) at lower leg level, right leg, initial encounter: Secondary | ICD-10-CM

## 2022-08-13 MED ORDER — IBUPROFEN 800 MG PO TABS: 800.0000 mg | ORAL_TABLET | Freq: Three times a day (TID) | ORAL | 0 refills | Status: DC

## 2022-08-13 NOTE — ED Provider Notes (Addendum)
[facility]    CSN: [id number] Arrival date & time: 08/13/22  1004      History   Chief Complaint Chief Complaint  Patient presents with   Fall    HPI Tina Keller is a 27 y.o. female who presents with R knee pain after falling in the bathroom and she felt a pop in this knee. She states her L leg went forward and her R leg back, with her knee flexed back. The pain is worse on medial area of knee, but anterior region is sore as well. Pain is worse to walk and move it. Tylenol has helped some. She has noticed swelling since the injury.     Past Medical History:  Diagnosis Date   Chlamydia infection 03/08/2021   Encounter for supervision of normal first pregnancy in second trimester 09/02/2015    Clinic [facility]) Prenatal Labs Dating  LMP Blood type: O/POS/-- (03/31 1427)  Genetic Screen 1 Screen:    AFP:     Quad: Normal    NIPS: Antibody:NEG (03/31 1427) Anatomic [place]  Repeat U/S normal  Rubella: <0.90 (03/31 1427) GTT Early: 85             Third trimester:  RPR: NON REAC (08/25 1336)  Flu vaccine  Declined  HBsAg: NEGATIVE (03/31 1427)  TDaP vaccine  Given                          Rhogam: N/A HIV: NONREACTIVE (08/25 1336)  Baby Food  Breast and Bottle                                     GBS: (For PCN allergy, check sensitivities)  Positive  Contraception  Undecided  Pap: Circumcision  Yes, [facility]   Pediatrician   Support Person       Iron deficiency anemia of pregnancy 01/20/2021   NSVD (normal spontaneous vaginal delivery) 12/25/2015   Poor weight gain of pregnancy 09/02/2020   Positive GBS test 12/09/2015   Rubella non-immune status, antepartum 12/01/2015   Needs MMR post partum    Patient Active Problem List   Diagnosis Date Noted   Polyhydramnios in third trimester 01/20/2021   Rubella non-immune status, antepartum 01/16/2021   Obesity in pregnancy, antepartum, third trimester 01/16/2021    Past Surgical History:  Procedure Laterality Date   NO PAST SURGERIES       OB History     Gravida  2   Para  2   Term  2   Preterm      AB      Living  2      SAB      IAB      Ectopic      Multiple  0   Live Births  2            Home Medications    Prior to Admission medications   Medication Sig Start Date End Date Taking? Authorizing Provider  acetaminophen (TYLENOL) 325 MG tablet Take 2 tablets (650 mg total) by mouth every 4 (four) hours as needed (for pain scale < 4). 03/21/21  Yes Beard, Samantha N, DO  ibuprofen (ADVIL) 800 MG tablet Take 1 tablet (800 mg total) by mouth 3 (three) times daily. 08/13/22  Yes Rodriguez-Southworth, Nettie Elm, PA-C    Family History Family History  Problem Relation Age of  Onset   Healthy Mother    Healthy Father     Social History Social History   Tobacco Use   Smoking status: Former    Types: Cigarettes    Quit date: 05/25/2020    Years since quitting: 2.2   Smokeless tobacco: Never  Vaping Use   Vaping Use: Every day  Substance Use Topics   Alcohol use: Yes    Comment: socially   Drug use: No     Allergies   Patient has no known allergies.   Review of Systems Review of Systems  Musculoskeletal:  Positive for arthralgias, gait problem and joint swelling.  Skin:  Negative for color change, rash and wound.     Physical Exam Triage Vital Signs ED Triage Vitals  Enc Vitals Group     BP 08/13/22 1049 114/74     Pulse Rate 08/13/22 1049 (!) 58     Resp 08/13/22 1049 16     Temp 08/13/22 1049 98 F (36.7 C)     Temp Source 08/13/22 1049 Oral     SpO2 08/13/22 1049 96 %     Weight 08/13/22 1049 290 lb (131.5 kg)     Height 08/13/22 1049 5' 7.5" (1.715 m)     Head Circumference --      Peak Flow --      Pain Score 08/13/22 1047 9     Pain Loc --      Pain Edu? --      Excl. in GC? --    No data found.  Updated Vital Signs BP 114/74 (BP Location: Right Arm)   Pulse (!) 58   Temp 98 F (36.7 C) (Oral)   Resp 16   Ht 5' 7.5" (1.715 m)   Wt 290 lb (131.5 kg)    LMP 07/25/2022 (Exact Date)   SpO2 96%   Breastfeeding No   BMI 44.75 kg/m   Visual Acuity Right Eye Distance:   Left Eye Distance:   Bilateral Distance:    Right Eye Near:   Left Eye Near:    Bilateral Near:     Physical Exam Vitals and nursing note reviewed.  Constitutional:      General: She is not in acute distress.    Appearance: She is obese. She is not toxic-appearing.  HENT:     Right Ear: External ear normal.     Left Ear: External ear normal.  Eyes:     General: No scleral icterus.    Conjunctiva/sclera: Conjunctivae normal.  Pulmonary:     Effort: Pulmonary effort is normal.  Musculoskeletal:     Cervical back: Neck supple.     Comments: R KNEE- with moderate swelling on medial area and mild on suprapatellar region. Has local tenderness on patella, medial joint region and medial knee. Medial knee pain is worse with testing medial collateral ligament. She is able to flex it fully, but can't extend it fully, lacking about 20 degrees due to pain.   Skin:    General: Skin is warm and dry.     Findings: No bruising, erythema, lesion or rash.  Neurological:     Mental Status: She is alert and oriented to person, place, and time.     Gait: Gait abnormal.  Psychiatric:        Mood and Affect: Mood normal.        Behavior: Behavior normal.        Thought Content: Thought content normal.  Judgment: Judgment normal.      UC Treatments / Results  Labs (all labs ordered are listed, but only abnormal results are displayed) Labs Reviewed - No data to display  EKG   Radiology DG Knee Complete 4 Views Right  Result Date: 08/13/2022 CLINICAL DATA:  Right knee pain after fall EXAM: RIGHT KNEE - COMPLETE 4 VIEW COMPARISON:  None Available. FINDINGS: No evidence of fracture, dislocation, or joint effusion. No evidence of arthropathy or other focal bone abnormality. Soft tissues are unremarkable. IMPRESSION: Negative. Electronically Signed   By: Jacob Moores  M.D.   On: 08/13/2022 11:20    Procedures Procedures (including critical care time)  Medications Ordered in UC Medications - No data to display  Initial Impression / Assessment and Plan / UC Course  I have reviewed the triage vital signs and the nursing notes.  Pertinent imaging results that were available during my care of the patient were reviewed by me and considered in my medical decision making (see chart for details).  R knee strain  She was placed on a knee immobilizer, crutches, and Ibuprofen as noted and needs to FU with Ortho this week.  Final Clinical Impressions(s) / UC Diagnoses   Final diagnoses:  Knee strain, right, initial encounter     Discharge Instructions      Ice area of pain for 20 minutes 2-3 times a day today and tomorrow Follow up with [facility] this week      ED Prescriptions     Medication Sig Dispense Auth. Provider   ibuprofen (ADVIL) 800 MG tablet Take 1 tablet (800 mg total) by mouth 3 (three) times daily. 21 tablet Rodriguez-Southworth, Nettie Elm, PA-C      PDMP not reviewed this encounter.   Garey Ham, PA-C 08/13/22 1139    Rodriguez-Southworth, [place], PA-C 08/13/22 1253

## 2022-08-13 NOTE — ED Triage Notes (Signed)
Patient here today with c/o right knee pain after falling Saturday night. Patient had just got home from going out and fell in the bathroom. Patient felt a pop in her right knee. Patient has increased pain with weightbearing and ROM. She took Tylenol with some relief. She has some swelling.

## 2022-08-13 NOTE — Discharge Instructions (Signed)
Ice area of pain for 20 minutes 2-3 times a day today and tomorrow Follow up with [facility] this week

## 2022-09-14 ENCOUNTER — Telehealth: Payer: Self-pay

## 2022-09-14 NOTE — Telephone Encounter (Signed)
Patient calls nurse line requesting to schedule an apt.   She reports she has been having intermittent "sharp stabbing" pains under left her breast. She reports this has been "going on for a while" however has increased over the last couple of days.   She denies any SOB, nausea, vomiting, left arm weakness or jaw pain.   She denies any obvious injury to the area.   She requests the first available apt. Patient scheduled for 8/12 with [facility].   ED precautions discussed with patient in the meantime.

## 2022-09-17 ENCOUNTER — Ambulatory Visit: Payer: Commercial Managed Care - HMO | Admitting: Student

## 2022-09-17 NOTE — Progress Notes (Deleted)
    SUBJECTIVE:   CHIEF COMPLAINT / HPI:   Left Breast Pain Sharp and Stabbing. "Going on for a while."  PERTINENT  PMH / PSH: ***  OBJECTIVE:   There were no vitals taken for this visit.  ***  ASSESSMENT/PLAN:   No problem-specific Assessment & Plan notes found for this encounter.     Eliezer Mccoy, MD [facility]

## 2022-11-22 ENCOUNTER — Ambulatory Visit: Payer: Commercial Managed Care - HMO | Admitting: Family Medicine

## 2022-11-22 ENCOUNTER — Ambulatory Visit: Payer: Commercial Managed Care - HMO

## 2022-11-22 NOTE — Progress Notes (Deleted)
   SUBJECTIVE:   CHIEF COMPLAINT / HPI:  Vaginal Discharge: Tina Keller is a 27 y.o. female presenting with vaginal discharge for *** days. States the discharge is of *** consistency. Endorses *** vaginal odor. Is not *** interested in screening for sexually transmitted infections today. Has contraception with *** {Contraceptives:21111124}. {DOES NOT does:27190::"does not"} use barrier method consistently.  PERTINENT PMH / PSH:  Sexually active with ***   OBJECTIVE:  There were no vitals taken for this visit.  General: NAD, pleasant, able to participate in exam. Respiratory: Normal effort, no obvious respiratory distress. Abdominal: No suprapubic tenderness, no abdominal tenderness with deep or light palpation.  No hepatosplenomegaly. Pelvic:  Vulva: Normal appearing vulva with no masses, tenderness or lesions, ***. Vagina: Normal appearing vagina with normal color, no lesions, with {GYN VAGINAL DISCHARGE:21986} discharge present, ***. Cervix: No lesions, {GYN VAGINAL DISCHARGE:21986} discharge present.  Chaperone *** CMA present for pelvic exam.   ASSESSMENT/PLAN:   Problem List Items Addressed This Visit   None   27 y.o. with vaginal discharge for *** days, as well as ***.  Physical exam significant for *** discharge.  Wet prep performed today shows *** consistent with ***.  Patient is ***not interested in STI screening. Wet prep as above.  Will treat with***. Discussed protection during intercourse and contraceptive methods***.  No follow-ups on file.  Emmanuelle Hibbitts Sharion Dove, MD [facility]

## 2022-11-23 ENCOUNTER — Other Ambulatory Visit: Payer: Self-pay

## 2022-11-23 ENCOUNTER — Other Ambulatory Visit (HOSPITAL_COMMUNITY)
Admission: RE | Admit: 2022-11-23 | Discharge: 2022-11-23 | Disposition: A | Discharge status: Home / Self Care | Payer: Managed Care, Other (non HMO) | Source: Ambulatory Visit | Attending: Family Medicine | Admitting: Family Medicine

## 2022-11-23 ENCOUNTER — Ambulatory Visit (INDEPENDENT_AMBULATORY_CARE_PROVIDER_SITE_OTHER): Payer: Managed Care, Other (non HMO)

## 2022-11-23 VITALS — BP 129/70 | HR 66 | Ht 67.0 in | Wt 297.2 lb

## 2022-11-23 DIAGNOSIS — Z113 Encounter for screening for infections with a predominantly sexual mode of transmission: Secondary | ICD-10-CM | POA: Diagnosis present

## 2022-11-23 NOTE — Progress Notes (Signed)
    SUBJECTIVE:   CHIEF COMPLAINT / HPI:   STI Screening Patient requests STI screening as she has been sexually active with a new partner.  Denies any discharge, irritation.  Does also mention a lesion on her mons pubis that was concerning for her.  Denies any irritation or drainage from the lesion.  PERTINENT  PMH / PSH: Obesity  OBJECTIVE:   BP 129/70   Pulse 66   Ht 5\' 7"  (1.702 m)   Wt 297 lb 3.2 oz (134.8 kg)   SpO2 100%   BMI 46.55 kg/m   General: well appearing, in no acute distress CV: RRR, radial pulses equal and palpable Resp: Normal work of breathing on room air Abd: Soft, non tender, non distended  GU: (Chaperoned by CMA) pinpoint erythematous raised lesion without edema or scaling or drainage on mons pubis.  Otherwise vagina without abnormal discharge, no CMT, no other vulvar lesions   ASSESSMENT/PLAN:   Assessment & Plan Screening examination for STI Based on exam I am not concerned for STIs at this time.  However we will still do tests.  Lesion on mons pubis unlikely herpes or due to an STI.  More likely inflamed skin tag like lesion secondary to shaving mons pubis. - Continue to monitor skin lesion - GC CT, trichomonas, HIV, RPR, hep B      Lockie Mola, MD [facility]

## 2022-11-23 NOTE — Patient Instructions (Signed)
It was wonderful to see you today.  Please bring ALL of your medications with you to every visit.   Today we talked about:  STI testing - I will let you know the results of the test.   For the issues with concentration, you will follow up about this.    Thank you for choosing [facility].   Please call [phone] with any questions about today's appointment.  Lockie Mola, MD  Family Medicine

## 2022-11-24 LAB — RPR: RPR Ser Ql: NONREACTIVE

## 2022-11-24 LAB — HEPATITIS B SURFACE ANTIGEN: Hepatitis B Surface Ag: NEGATIVE

## 2022-11-24 LAB — HIV ANTIBODY (ROUTINE TESTING W REFLEX): HIV Screen 4th Generation wRfx: NONREACTIVE

## 2022-11-24 LAB — HCV AB W REFLEX TO QUANT PCR: HCV Ab: NONREACTIVE

## 2022-11-24 LAB — HCV INTERPRETATION

## 2022-11-26 LAB — CERVICOVAGINAL ANCILLARY ONLY
Chlamydia: NEGATIVE
Comment: NEGATIVE
Comment: NEGATIVE
Comment: NORMAL
Neisseria Gonorrhea: NEGATIVE
Trichomonas: NEGATIVE

## 2022-11-27 ENCOUNTER — Encounter: Payer: Self-pay | Admitting: Family Medicine

## 2022-11-27 ENCOUNTER — Ambulatory Visit: Payer: Managed Care, Other (non HMO) | Admitting: Family Medicine

## 2022-12-01 ENCOUNTER — Ambulatory Visit (HOSPITAL_COMMUNITY)
Admission: EM | Admit: 2022-12-01 | Discharge: 2022-12-01 | Disposition: A | Discharge status: Home / Self Care | Payer: Managed Care, Other (non HMO) | Attending: Internal Medicine | Admitting: Internal Medicine

## 2022-12-01 ENCOUNTER — Encounter (HOSPITAL_COMMUNITY): Payer: Self-pay

## 2022-12-01 DIAGNOSIS — N3001 Acute cystitis with hematuria: Secondary | ICD-10-CM | POA: Diagnosis present

## 2022-12-01 LAB — POCT URINALYSIS DIP (MANUAL ENTRY)
Bilirubin, UA: NEGATIVE
Glucose, UA: NEGATIVE mg/dL
Ketones, POC UA: NEGATIVE mg/dL
Nitrite, UA: POSITIVE — AB
Protein Ur, POC: 30 mg/dL — AB
Spec Grav, UA: 1.025 (ref 1.010–1.025)
Urobilinogen, UA: 0.2 U/dL
pH, UA: 7 (ref 5.0–8.0)

## 2022-12-01 MED ORDER — NITROFURANTOIN MONOHYD MACRO 100 MG PO CAPS: 100.0000 mg | ORAL_CAPSULE | Freq: Two times a day (BID) | ORAL | 0 refills | Status: DC

## 2022-12-01 MED ORDER — PHENAZOPYRIDINE HCL 95 MG PO TABS: 95.0000 mg | ORAL_TABLET | Freq: Three times a day (TID) | ORAL | 0 refills | Status: DC | PRN

## 2022-12-01 NOTE — ED Triage Notes (Signed)
Patient having blood in urination, painful urination onset 2 days ago. Patient used to get UTIs a lot when she was pregnant.   No discharge, odor, or itching. Was recently STD tested and was negative.

## 2022-12-01 NOTE — ED Provider Notes (Signed)
[facility]    CSN: [id number] Arrival date & time: 12/01/22  1511      History   Chief Complaint Chief Complaint  Patient presents with   Urinary Tract Infection    HPI Tina Keller is a 27 y.o. female.   Patient presents to clinic for hematuria and dysuria that has been present for the past 2 days.  She has had a little bit of suprapubic discomfort.  She denies any changes to her vaginal discharge, no odor or itching.  Reports she was recently tested for STIs and they were negative.  Has not tried any interventions for her symptoms.  She denies any nausea, vomiting, flank pain or fevers.  The history is provided by the patient and medical records.  Urinary Tract Infection   Past Medical History:  Diagnosis Date   Chlamydia infection 03/08/2021   Encounter for supervision of normal first pregnancy in second trimester 09/02/2015    Clinic [facility]) Prenatal Labs Dating  LMP Blood type: O/POS/-- (03/31 1427)  Genetic Screen 1 Screen:    AFP:     Quad: Normal    NIPS: Antibody:NEG (03/31 1427) Anatomic [place]  Repeat U/S normal  Rubella: <0.90 (03/31 1427) GTT Early: 85             Third trimester:  RPR: NON REAC (08/25 1336)  Flu vaccine  Declined  HBsAg: NEGATIVE (03/31 1427)  TDaP vaccine  Given                          Rhogam: N/A HIV: NONREACTIVE (08/25 1336)  Baby Food  Breast and Bottle                                     GBS: (For PCN allergy, check sensitivities)  Positive  Contraception  Undecided  Pap: Circumcision  Yes, [facility]   Pediatrician   Support Person       Iron deficiency anemia of pregnancy 01/20/2021   NSVD (normal spontaneous vaginal delivery) 12/25/2015   Poor weight gain of pregnancy 09/02/2020   Positive GBS test 12/09/2015   Rubella non-immune status, antepartum 12/01/2015   Needs MMR post partum    Patient Active Problem List   Diagnosis Date Noted   Polyhydramnios in third trimester 01/20/2021   Rubella non-immune status, antepartum  01/16/2021   Obesity in pregnancy, antepartum, third trimester 01/16/2021    Past Surgical History:  Procedure Laterality Date   NO PAST SURGERIES      OB History     Gravida  2   Para  2   Term  2   Preterm      AB      Living  2      SAB      IAB      Ectopic      Multiple  0   Live Births  2            Home Medications    Prior to Admission medications   Medication Sig Start Date End Date Taking? Authorizing Provider  nitrofurantoin, macrocrystal-monohydrate, (MACROBID) 100 MG capsule Take 1 capsule (100 mg total) by mouth 2 (two) times daily. 12/01/22  Yes Rinaldo Ratel, [place] N, FNP  phenazopyridine (PYRIDIUM) 95 MG tablet Take 1 tablet (95 mg total) by mouth 3 (three) times daily as needed for pain. 12/01/22  Yes  Kiyoko Mcguirt, [place] N, FNP    Family History Family History  Problem Relation Age of Onset   Healthy Mother    Healthy Father     Social History Social History   Tobacco Use   Smoking status: Former    Current packs/day: 0.00    Types: Cigarettes    Quit date: 05/25/2020    Years since quitting: 2.5   Smokeless tobacco: Never  Vaping Use   Vaping status: Every Day  Substance Use Topics   Alcohol use: Yes    Comment: socially   Drug use: No     Allergies   Patient has no known allergies.   Review of Systems Review of Systems  Per HPI   Physical Exam Triage Vital Signs ED Triage Vitals  Encounter Vitals Group     BP 12/01/22 1553 130/84     Systolic BP Percentile --      Diastolic BP Percentile --      Pulse Rate 12/01/22 1553 70     Resp 12/01/22 1553 16     Temp 12/01/22 1553 98.4 F (36.9 C)     Temp Source 12/01/22 1553 Oral     SpO2 12/01/22 1553 98 %     Weight 12/01/22 1553 297 lb (134.7 kg)     Height 12/01/22 1553 5' 7.5" (1.715 m)     Head Circumference --      Peak Flow --      Pain Score 12/01/22 1552 4     Pain Loc --      Pain Education --      Exclude from Growth Chart --    No data  found.  Updated Vital Signs BP 130/84 (BP Location: Left Wrist)   Pulse 70   Temp 98.4 F (36.9 C) (Oral)   Resp 16   Ht 5' 7.5" (1.715 m)   Wt 297 lb (134.7 kg)   LMP 11/14/2022 (Exact Date)   SpO2 98%   Breastfeeding No   BMI 45.83 kg/m   Visual Acuity Right Eye Distance:   Left Eye Distance:   Bilateral Distance:    Right Eye Near:   Left Eye Near:    Bilateral Near:     Physical Exam Vitals and nursing note reviewed.  Constitutional:      Appearance: Normal appearance.  HENT:     Head: Normocephalic and atraumatic.     Right Ear: External ear normal.     Left Ear: External ear normal.     Nose: Nose normal.     Mouth/Throat:     Mouth: Mucous membranes are moist.  Eyes:     Conjunctiva/sclera: Conjunctivae normal.  Cardiovascular:     Rate and Rhythm: Normal rate.  Pulmonary:     Effort: Pulmonary effort is normal. No respiratory distress.  Abdominal:     Tenderness: There is no right CVA tenderness or left CVA tenderness.  Musculoskeletal:        General: Normal range of motion.  Skin:    General: Skin is warm and dry.  Neurological:     General: No focal deficit present.     Mental Status: She is alert and oriented to person, place, and time.  Psychiatric:        Mood and Affect: Mood normal.        Behavior: Behavior normal. Behavior is cooperative.      UC Treatments / Results  Labs (all labs ordered are listed, but only abnormal results are  displayed) Labs Reviewed  POCT URINALYSIS DIP (MANUAL ENTRY) - Abnormal; Notable for the following components:      Result Value   Clarity, UA cloudy (*)    Blood, UA moderate (*)    Protein Ur, POC =30 (*)    Nitrite, UA Positive (*)    Leukocytes, UA Small (1+) (*)    All other components within normal limits  URINE CULTURE    EKG   Radiology No results found.  Procedures Procedures (including critical care time)  Medications Ordered in UC Medications - No data to display  Initial  Impression / Assessment and Plan / UC Course  I have reviewed the triage vital signs and the nursing notes.  Pertinent labs & imaging results that were available during my care of the patient were reviewed by me and considered in my medical decision making (see chart for details).  Vitals and triage reviewed, patient is hemodynamically stable.  Afebrile, without tachycardia, negative for CVA tenderness.  Urinalysis did show red blood cells, leukocytes and nitrates.  Will treat with Macrobid and send urine for culture.  Does not appear to have any pyelonephritis at this time.  Plan of care, follow-up care and return precautions given, no questions at this time.     Final Clinical Impressions(s) / UC Diagnoses   Final diagnoses:  Acute cystitis with hematuria     Discharge Instructions      You have urinary tract infection, please take all antibiotics as prescribed and until finished.  You can take them with food to prevent gastrointestinal upset.  You can take the Pyridium up to 3 times daily as needed for dysuria, this will change the color of your secretions in your urine to orange, red color.  Ensure you are drinking at least 64 ounces of water daily to help flush your kidneys.  You can alternate between Tylenol and ibuprofen as needed for any pain.  We have sent your urine off for culture and we will contact you if we need to modify your antibiotic therapy.  Please return to clinic or follow-up with your primary care provider if no improvement despite antibiotics.      ED Prescriptions     Medication Sig Dispense Auth. Provider   nitrofurantoin, macrocrystal-monohydrate, (MACROBID) 100 MG capsule Take 1 capsule (100 mg total) by mouth 2 (two) times daily. 10 capsule Oluwatimilehin Balfour, [place] N, FNP   phenazopyridine (PYRIDIUM) 95 MG tablet Take 1 tablet (95 mg total) by mouth 3 (three) times daily as needed for pain. 10 tablet Melton Walls, [place] N, FNP      PDMP not reviewed this  encounter.   Desmin Daleo, [place] N, [place] 12/01/22 [id number]

## 2022-12-01 NOTE — Discharge Instructions (Addendum)
You have urinary tract infection, please take all antibiotics as prescribed and until finished.  You can take them with food to prevent gastrointestinal upset.  You can take the Pyridium up to 3 times daily as needed for dysuria, this will change the color of your secretions in your urine to orange, red color.  Ensure you are drinking at least 64 ounces of water daily to help flush your kidneys.  You can alternate between Tylenol and ibuprofen as needed for any pain.  We have sent your urine off for culture and we will contact you if we need to modify your antibiotic therapy.  Please return to clinic or follow-up with your primary care provider if no improvement despite antibiotics.

## 2022-12-04 ENCOUNTER — Ambulatory Visit: Payer: Managed Care, Other (non HMO) | Admitting: Family Medicine

## 2022-12-04 ENCOUNTER — Encounter: Payer: Self-pay | Admitting: Family Medicine

## 2022-12-04 VITALS — BP 125/66 | HR 84 | Ht 67.0 in | Wt 299.4 lb

## 2022-12-04 DIAGNOSIS — R413 Other amnesia: Secondary | ICD-10-CM | POA: Diagnosis not present

## 2022-12-04 DIAGNOSIS — F329 Major depressive disorder, single episode, unspecified: Secondary | ICD-10-CM | POA: Insufficient documentation

## 2022-12-04 LAB — URINE CULTURE: Culture: >=100000 — AB

## 2022-12-04 MED ORDER — ESCITALOPRAM OXALATE 10 MG PO TABS
10.0000 mg | ORAL_TABLET | Freq: Every day | ORAL | 1 refills | Status: DC
Start: 2022-12-04 — End: 2023-09-05

## 2022-12-04 NOTE — Assessment & Plan Note (Signed)
Depression with anxiety As discussed with her, she will benefit from CBT I gave her information about contacting her health insurance for a Psychiatry/Psychology referral, and she agreed to call today.  We discussed the benefit of initiating pharmacotherapy + Psychotherapy She agreed to try Lexapro today F/U in 4-6 weeks for reassessment In the meantime, we will check her TSH, Vits B12, and D She had a recent neg RPR and HIV tests I will contact her soon with her test results

## 2022-12-04 NOTE — Patient Instructions (Addendum)
To schedule with a psychiatrist, please contact your insurance company to find an in-network psychiatrist.   Generalized Anxiety Disorder, Adult Generalized anxiety disorder (GAD) is a mental health condition. Unlike normal worries, anxiety related to GAD is not triggered by a specific event. These worries do not fade or get better with time. GAD interferes with relationships, work, and school. GAD symptoms can vary from mild to severe. People with severe GAD can have intense waves of anxiety with physical symptoms that are similar to panic attacks. What are the causes? The exact cause of GAD is not known, but the following are believed to have an impact: Differences in natural brain chemicals. Genes passed down from parents to children. Differences in the way threats are perceived. Development and stress during childhood. Personality. What increases the risk? The following factors may make you more likely to develop this condition: Being female. Having a family history of anxiety disorders. Being very shy. Experiencing very stressful life events, such as the death of a loved one. Having a very stressful family environment. What are the signs or symptoms? People with GAD often worry excessively about many things in their lives, such as their health and family. Symptoms may also include: Mental and emotional symptoms: Worrying excessively about natural disasters. Fear of being late. Difficulty concentrating. Fears that others are judging your performance. Physical symptoms: Fatigue. Headaches, muscle tension, muscle twitches, trembling, or feeling shaky. Feeling like your heart is pounding or beating very fast. Feeling out of breath or like you cannot take a deep breath. Having trouble falling asleep or staying asleep, or experiencing restlessness. Sweating. Nausea, diarrhea, or irritable bowel syndrome (IBS). Behavioral symptoms: Experiencing erratic moods or  irritability. Avoidance of new situations. Avoidance of people. Extreme difficulty making decisions. How is this diagnosed? This condition is diagnosed based on your symptoms and medical history. You will also have a physical exam. Your health care provider may perform tests to rule out other possible causes of your symptoms. To be diagnosed with GAD, a person must have anxiety that: Is out of his or her control. Affects several different aspects of his or her life, such as work and relationships. Causes distress that makes him or her unable to take part in normal activities. Includes at least three symptoms of GAD, such as restlessness, fatigue, trouble concentrating, irritability, muscle tension, or sleep problems. Before your health care provider can confirm a diagnosis of GAD, these symptoms must be present more days than they are not, and they must last for 6 months or longer. How is this treated? This condition may be treated with: Medicine. Antidepressant medicine is usually prescribed for long-term daily control. Anti-anxiety medicines may be added in severe cases, especially when panic attacks occur. Talk therapy (psychotherapy). Certain types of talk therapy can be helpful in treating GAD by providing support, education, and guidance. Options include: Cognitive behavioral therapy (CBT). People learn coping skills and self-calming techniques to ease their physical symptoms. They learn to identify unrealistic thoughts and behaviors and to replace them with more appropriate thoughts and behaviors. Acceptance and commitment therapy (ACT). This treatment teaches people how to be mindful as a way to cope with unwanted thoughts and feelings. Biofeedback. This process trains you to manage your body's response (physiological response) through breathing techniques and relaxation methods. You will work with a therapist while machines are used to monitor your physical symptoms. Stress management  techniques. These include yoga, meditation, and exercise. A mental health specialist can help determine which treatment  is best for you. Some people see improvement with one type of therapy. However, other people require a combination of therapies. Follow these instructions at home: Lifestyle Maintain a consistent routine and schedule. Anticipate stressful situations. Create a plan and allow extra time to work with your plan. Practice stress management or self-calming techniques that you have learned from your therapist or your health care provider. Exercise regularly and spend time outdoors. Eat a healthy diet that includes plenty of vegetables, fruits, whole grains, low-fat dairy products, and lean protein. Do not eat a lot of foods that are high in fat, added sugar, or salt (sodium). Drink plenty of water. Avoid alcohol. Alcohol can increase anxiety. Avoid caffeine and certain over-the-counter cold medicines. These may make you feel worse. Ask your pharmacist which medicines to avoid. General instructions Take over-the-counter and prescription medicines only as told by your health care provider. Understand that you are likely to have setbacks. Accept this and be kind to yourself as you persist to take better care of yourself. Anticipate stressful situations. Create a plan and allow extra time to work with your plan. Recognize and accept your accomplishments, even if you judge them as small. Spend time with people who care about you. Keep all follow-up visits. This is important. Where to find more information [employer] of Mental Health: http://www.maynard.net/ Substance Abuse and Mental Health Services: SkateOasis.com.pt Contact a health care provider if: Your symptoms do not get better. Your symptoms get worse. You have signs of depression, such as: A persistently sad or irritable mood. Loss of enjoyment in activities that used to bring you joy. Change in weight or eating. Changes in  sleeping habits. Get help right away if: You have thoughts about hurting yourself or others. If you ever feel like you may hurt yourself or others, or have thoughts about taking your own life, get help right away. Go to your nearest emergency department or: Call your local emergency services (911 in the [place] Call a suicide crisis helpline, such as the National Suicide Prevention Lifeline at 8383844364 or 988 in the [place] This is open 24 hours a day in the [place] Text the Crisis Text Line at 334-314-9810 (in the [place] Summary Generalized anxiety disorder (GAD) is a mental health condition that involves worry that is not triggered by a specific event. People with GAD often worry excessively about many things in their lives, such as their health and family. GAD may cause symptoms such as restlessness, trouble concentrating, sleep problems, frequent sweating, nausea, diarrhea, headaches, and trembling or muscle twitching. A mental health specialist can help determine which treatment is best for you. Some people see improvement with one type of therapy. However, other people require a combination of therapies. This information is not intended to replace advice given to you by your health care provider. Make sure you discuss any questions you have with your health care provider. Document Revised: 08/17/2020 Document Reviewed: 05/15/2020 Elsevier Patient Education  2024 [employer].

## 2022-12-04 NOTE — Progress Notes (Signed)
    SUBJECTIVE:   CHIEF COMPLAINT / HPI:   HPI Mood concern: The patient presents with multiple concerns related to her mood, which have been ongoing for a few months.  She is becoming more short-tempered and will often zone out for a few minutes sometimes before regaining her subconsciousness. She also notes poor memory and reduced concentration issues, sometimes affecting her work. She gets very emotional lately for no reason. She endorsed associated intermittent headaches with stress.  She feels she is becoming dependent on others for emotional approval and sometimes overthink situations.  PERTINENT  PMH / PSH: PMHx reviewed  OBJECTIVE:   BP 125/66   Pulse 84   Ht 5\' 7"  (1.702 m)   Wt 299 lb 6 oz (135.8 kg)   LMP 11/14/2022 (Exact Date)   SpO2 100%   BMI 46.89 kg/m   Physical Exam Vitals and nursing note reviewed.  Cardiovascular:     Rate and Rhythm: Normal rate and regular rhythm.     Heart sounds: Normal heart sounds. No murmur heard. Pulmonary:     Effort: Pulmonary effort is normal. No respiratory distress.     Breath sounds: Normal breath sounds. No wheezing.  Psychiatric:        Attention and Perception: Attention normal.        Mood and Affect: Mood is depressed. Affect is tearful.        Speech: Speech normal.        Behavior: Behavior normal.        Thought Content: Thought content does not include homicidal or suicidal ideation.        Cognition and Memory: Cognition normal.        Judgment: Judgment normal.     Comments: Flowsheet Row Office Visit from 12/04/2022 in [facility]  PHQ-9 Total Score 7          ASSESSMENT/PLAN:   MDD (major depressive disorder) Depression with anxiety As discussed with her, she will benefit from CBT I gave her information about contacting her health insurance for a Psychiatry/Psychology referral, and she agreed to call today.  We discussed the benefit of initiating pharmacotherapy +  Psychotherapy She agreed to try Lexapro today F/U in 4-6 weeks for reassessment In the meantime, we will check her TSH, Vits B12, and D She had a recent neg RPR and HIV tests I will contact her soon with her test results     Janit Pagan, MD [facility]

## 2022-12-05 ENCOUNTER — Telehealth: Payer: Self-pay | Admitting: Family Medicine

## 2022-12-05 LAB — TSH RFX ON ABNORMAL TO FREE T4: TSH: 1.19 u[IU]/mL (ref 0.450–4.500)

## 2022-12-05 LAB — VITAMIN D 25 HYDROXY (VIT D DEFICIENCY, FRACTURES): Vit D, 25-Hydroxy: 11.9 ng/mL — ABNORMAL LOW (ref 30.0–100.0)

## 2022-12-05 LAB — VITAMIN B12: Vitamin B-12: 484 pg/mL (ref 232–1245)

## 2022-12-05 MED ORDER — VITAMIN D (ERGOCALCIFEROL) 1.25 MG (50000 UNIT) PO CAPS: 50000.0000 [IU] | ORAL_CAPSULE | ORAL | 0 refills | Status: DC

## 2022-12-05 NOTE — Telephone Encounter (Signed)
HIPAA compliant callback message left.  Please advise -  Her Vitamin D level is low - Vit D deficiency I sent in Vit D supplement to her pharmacy to use one tab weekly Repeat D - level check in 4-8 weeks.

## 2022-12-05 NOTE — Telephone Encounter (Signed)
Patient returns call to nurse line.   Patient advised of low Vitamin D and the need to start weekly supplements.   Patient will call us towards the end of prescription to schedule a visit for recheck.

## 2022-12-14 ENCOUNTER — Other Ambulatory Visit: Payer: Self-pay | Admitting: Medical Genetics

## 2022-12-14 DIAGNOSIS — Z006 Encounter for examination for normal comparison and control in clinical research program: Secondary | ICD-10-CM

## 2023-01-10 ENCOUNTER — Other Ambulatory Visit (HOSPITAL_COMMUNITY): Payer: Managed Care, Other (non HMO)

## 2023-01-14 ENCOUNTER — Ambulatory Visit: Payer: Self-pay

## 2023-01-15 ENCOUNTER — Ambulatory Visit (INDEPENDENT_AMBULATORY_CARE_PROVIDER_SITE_OTHER): Payer: Commercial Managed Care - HMO | Admitting: Student

## 2023-01-15 VITALS — BP 134/73 | HR 74 | Ht 67.5 in | Wt 300.6 lb

## 2023-01-15 DIAGNOSIS — Z3200 Encounter for pregnancy test, result unknown: Secondary | ICD-10-CM | POA: Diagnosis not present

## 2023-01-15 DIAGNOSIS — N926 Irregular menstruation, unspecified: Secondary | ICD-10-CM | POA: Diagnosis not present

## 2023-01-15 DIAGNOSIS — Z3A01 Less than 8 weeks gestation of pregnancy: Secondary | ICD-10-CM | POA: Diagnosis not present

## 2023-01-15 LAB — POCT URINE PREGNANCY: Preg Test, Ur: POSITIVE — AB

## 2023-01-15 NOTE — Progress Notes (Signed)
    SUBJECTIVE:   CHIEF COMPLAINT / HPI:   Pregnancy  Is reports that she had a positive pregnancy test at home on Sunday.  This is a desired pregnancy.  She reports she received prenatal care for her last 2 pregnancies at her clinic.  Last 2 pregnancies were relatively uncomplicated, with the most recent one having polyhydramnios and an early induction.  No current cramping, bleeding, swelling.  Not currently taking prenatal vitamin.  Certain of her period on Nov 6.  Estimate gestation [redacted]w[redacted]d. ED: 09/18/2022.  OBJECTIVE:   BP 134/73   Pulse 74   Ht 5' 7.5" (1.715 m)   Wt (!) 300 lb 9.6 oz (136.4 kg)   LMP 12/12/2022 (Exact Date)   SpO2 100%   BMI 46.39 kg/m    General: NAD, pleasant  Cardio: RRR, no MRG. Cap Refill <2s. Respiratory: CTAB, normal wob on RA GI: Abdomen is soft, not tender, not distended. BS present Skin: Warm and dry   ASSESSMENT/PLAN:   Assessment & Plan Less than [redacted] weeks gestation of pregnancy In office urine pregnancy test positive. Desired pregnancy. LMP November 6.  Estimated gestational age 61-week 6-day.  EDD: 09/18/2022. -Counseled on Prenatal vitamin and Return precautions -Initial OB visit scheduled for 02/04/2023 with faculty    Tiffany Kocher, DO [facility]

## 2023-01-15 NOTE — Patient Instructions (Addendum)
Please follow-up with Dr. Pollie Meyer on 02/04/2023 at 8:30 AM for your first prenatal visit.  At this appointment you will have labs drawn.  Prenatal Classes Go to [email or web address] for more information on the pregnancy and child birth classes that [facility] has to offer.   Pregnancy Related Return Precautions The follow are signs/symptoms that are abnormal in pregnancy and may require further evaluation by a physician: Go to the MAU at [facility] at [facility] if: You have cramping/contractions that do not go away with drinking water, especially if they are lasting 30 seconds to 1.5 minutes, coming and going every 5-10 minutes for an hour or more, or are getting stronger and you cannot walk or talk while having a contraction/cramp. Your water breaks.  Sometimes it is a big gush of fluid, sometimes it is just a trickle that keeps getting your underwear wet or running down your legs You have vaginal bleeding.    You do not feel your baby moving like normal.  If you do not, get something to eat and drink (something cold or something with sugar like peanut butter or juice) and lay down and focus on feeling your baby move. If your baby is still not moving like normal, you should go to MAU. You should feel your baby move 6 times in one hour, or 10 times in two hours. You have a persistent headache that does not go away with 1 g of Tylenol, vision changes, chest pain, difficulty breathing, severe pain in your right upper abdomen, worsening leg swelling- these can all be signs of high blood pressure in pregnancy and need to be evaluated by a provider immediately  These are all concerning in pregnancy and if you have any of these I recommend you call your PCP and present to the Maternity Admissions Unit (map below) for further evaluation.  For any pregnancy-related emergencies, please go to the Maternity Admissions Unit in the [facility]'s & Children's Center at  [facility]. You will use hospital Entrance C.    Our clinic number is [phone].   Dr Miquel [facility]

## 2023-01-16 ENCOUNTER — Other Ambulatory Visit (HOSPITAL_COMMUNITY): Payer: Managed Care, Other (non HMO) | Attending: Medical Genetics

## 2023-01-22 ENCOUNTER — Telehealth: Payer: Self-pay

## 2023-01-22 NOTE — Telephone Encounter (Signed)
Patient LVM on nurse line requesting medication for nausea.   She is approx [redacted] weeks pregnant.   Attempted to call patient back to get more information.   Patient did not answer, left voicemail asking that she call back to office.   Veronda Prude, RN

## 2023-01-23 NOTE — Telephone Encounter (Signed)
Scheduled appointment and MAU precautions are appropriate. Thanks.

## 2023-01-23 NOTE — Telephone Encounter (Signed)
Patient returns call to nurse line.   She reports she is ~ [redacted] weeks pregnant and has been experiencing nausea and occasional vomiting.   She reports she is able to keep fluids down. She reports she is eating small meals a day, however it takes her a "while" to finish a meal.   No vaginal bleeding or abdominal cramping.  Advised small sips of water to stay hydrated.  She has an apt with Pollie Meyer on 01/25/2023.  Will forward to PCP for advisement.   MAU precautions discussed.

## 2023-02-04 ENCOUNTER — Other Ambulatory Visit (HOSPITAL_COMMUNITY)
Admission: RE | Admit: 2023-02-04 | Discharge: 2023-02-04 | Disposition: A | Discharge status: Home / Self Care | Payer: Commercial Managed Care - HMO | Source: Ambulatory Visit | Attending: Family Medicine | Admitting: Family Medicine

## 2023-02-04 ENCOUNTER — Ambulatory Visit (HOSPITAL_COMMUNITY)
Admission: RE | Admit: 2023-02-04 | Discharge: 2023-02-04 | Disposition: A | Discharge status: Home / Self Care | Payer: Commercial Managed Care - HMO | Source: Ambulatory Visit | Attending: Family Medicine | Admitting: Family Medicine

## 2023-02-04 ENCOUNTER — Ambulatory Visit: Payer: Commercial Managed Care - HMO | Admitting: Family Medicine

## 2023-02-04 ENCOUNTER — Encounter: Payer: Self-pay | Admitting: Family Medicine

## 2023-02-04 ENCOUNTER — Other Ambulatory Visit (HOSPITAL_COMMUNITY): Payer: Self-pay | Admitting: Family Medicine

## 2023-02-04 VITALS — BP 123/54 | HR 80 | Wt 291.2 lb

## 2023-02-04 DIAGNOSIS — Z3A08 8 weeks gestation of pregnancy: Secondary | ICD-10-CM | POA: Diagnosis not present

## 2023-02-04 DIAGNOSIS — Z3481 Encounter for supervision of other normal pregnancy, first trimester: Secondary | ICD-10-CM | POA: Diagnosis present

## 2023-02-04 DIAGNOSIS — Z3A01 Less than 8 weeks gestation of pregnancy: Secondary | ICD-10-CM | POA: Diagnosis not present

## 2023-02-04 DIAGNOSIS — O30041 Twin pregnancy, dichorionic/diamniotic, first trimester: Secondary | ICD-10-CM | POA: Insufficient documentation

## 2023-02-04 MED ORDER — DOXYLAMINE-PYRIDOXINE 10-10 MG PO TBEC: DELAYED_RELEASE_TABLET | ORAL | 3 refills | Status: DC

## 2023-02-04 NOTE — Progress Notes (Unsigned)
Patient Name: Tina Keller Date of Birth: 1995/09/22  [facility] Initial Prenatal Visit  Tina Keller is a 27 y.o. year old G3P2002 at [redacted]w[redacted]d who presents for her initial prenatal visit. Pregnancy is planned She reports nausea and vomiting. She is taking a prenatal vitamin.  She denies pelvic pain. Did have 1d of mild vaginal bleeding 3 days ago, has not recurred since then. Rh positive.  Pregnancy Dating: The patient is dated by LMP of 12/12/22. Period was regularly occurring, typical period for her, no birth control prior to getting pregnant (was using coitus interruptus).  Lab Review: Labs from prior pregnancy show O+ blood type, normal hemoglobin fractionation cascade Pregnancy labs for this pregnancy to be drawn today  PMH: Reviewed and as detailed below: History of depression/anxiety - currently taking lexapro 10mg  daily. Tolerating this well. Denies SI/HI. Feels mood is improved since starting this about 6 Keller ago. No history of gHTN, pre-E, GDM, or DM Gestational Hypertension/preeclampsia: No  History of STI Yes, chlamydia in last pregnancy Abnormal Pap smear:  Yes, ASCUS March 2024 (negative HPV) Genital herpes simplex:  No   PSH: No prior surgeries  Obstetric History: Obstetric history tab updated and reviewed.  Summary of prior pregnancies:  2017 SVD at term, with epidural, no complications 2022 SVD at term, with epidural, IOL for polyhydramnios Indications for referral were reviewed, and the patient has no obstetric indications for referral to High Risk OB Clinic at this time.   Social History: Partner's name: Rosezena Sensor Tobacco use: smoked 2 black and milds daily prior to pregnancy, quit cold [place] when found out she was pregnant Alcohol use:  previously social drinker prior to pregnancy, none since pregnant Other substance use:  No  Current Medications:  Lexapro 10mg  daily Vitamin D 50,000 units weekly Prenatal vitamin   Reviewed and appropriate in pregnancy.   Genetic and Infection Screen: Flow Sheet Updated Yes Desires genetic screening with NIPT once >10w GA  Prenatal Exam: BP (!) 123/54   Pulse 80   Wt 291 lb 3.2 oz (132.1 kg)   LMP 12/12/2022 (Exact Date)   BMI 44.94 kg/m   Gen: Well nourished, well developed.  No distress.  Vitals noted. HEENT: Normocephalic, atraumatic.   CV: RRR no murmur, gallops or rubs Lungs: CTA B.  Normal respiratory effort without wheezes or rales. Abd: soft, NTND. Uterus not appreciated above pelvis. GU: Normal external female genitalia without lesions.  Nl vaginal, well rugated without lesions. Normal appearing white vaginal discharge. Cervix not easily visualized.  Bimanual exam: No adnexal mass or TTP. No CMT.  Uterus size 8 Keller Ext: No clubbing, cyanosis or edema. Psych: Normal grooming and dress.  Not depressed or anxious appearing.  Normal thought content and process without flight of ideas or looseness of associations  Assessment/Plan:  Tina Keller is a 27 y.o. G3P2002 at [redacted]w[redacted]d who presents to initiate prenatal care. She is doing well.   Current pregnancy issues include ***.  Routine prenatal care: As dating {ACTION; IS/IS NOT:[id number]::"is not"} reliable, a dating ultrasound {HAS HAS NOT:18834::"has"} been ordered. Dating tab updated. Pre-pregnancy weight updated. Expected weight gain this pregnancy is {weight gain pregnancy :23296::"25-35 pounds "} Prenatal labs reviewed, notable for ***. Indications for referral to [facility] were reviewed and the patient {DOES NOT does:27190::"does not"} meet criteria for referral.  Medication list reviewed and updated.  Recommended patient see a dentist for regular care.  Bleeding and pain precautions reviewed. Importance of prenatal vitamins reviewed.  Genetic screening offered.  Patient opted for: {obgeneticscreen:23414}. The patient has the following indications for aspirinto begin 81 mg at 12-16 Keller: One high  risk condition: {fmcaspirinobhigh:26167} MORE than one moderate risk condition: {fmcaspirinobmoderate:26168} Aspirin {WAS/WAS NOT:[id number]::"was not"}  recommended today based upon above risk factors (one high risk condition or more than one moderate risk factor)  The patient {will/will not be:23415} age 44 or over at time of delivery. Referral to genetic counseling {WAS/WAS NOT:[id number]::"was not"} offered today.  The patient has the following risk factors for preexisting diabetes: {Pre-existing diabetes screening:23343::"Reviewed indications for early 1 hour glucose testing, not indicated "}. An early 1 hour glucose tolerance test {WAS/WAS NOT:[id number]::"was not"} ordered. Pregnancy Medical Home and PHQ-9 forms completed, problems noted: {yes/no:20286}  2. Pregnancy issues include the following which were addressed today:  ***   Follow up 4 Keller for next prenatal visit.  Vit D preg ***

## 2023-02-04 NOTE — Patient Instructions (Addendum)
It was great to see you again today.  Go to the MAU at [facility] at [facility] if: You have pain in your lower abdomen or pelvic area Your water breaks.  Sometimes it is a big gush of fluid, sometimes it is just a trickle that keeps getting your underwear wet or running down your legs You have vaginal bleeding.   For information on cost of genetic testing bloodwork through the company [place], please call or text Ron Parker at [phone].   Recommend we do 1 hour diabetes test at next visit  Be well, Dr. Pollie Meyer   First Trimester of Pregnancy  The first trimester of pregnancy starts on the first day of your last menstrual period until the end of week 12. This is months 1 through 3 of pregnancy. A week after a sperm fertilizes an egg, the egg will implant into the wall of the uterus and begin to develop into a baby. By the end of 12 weeks, all the baby's organs will be formed and the baby will be 2-3 inches in size. Body changes during your first trimester Your body goes through many changes during pregnancy. The changes vary and generally return to normal after your baby is born. Physical changes You may gain or lose weight. Your breasts may begin to grow larger and become tender. The tissue that surrounds your nipples (areola) may become darker. Dark spots or blotches (chloasma or mask of pregnancy) may develop on your face. You may have changes in your hair. These can include thickening or thinning of your hair or changes in texture. Health changes You may feel nauseous, and you may vomit. You may have heartburn. You may develop headaches. You may develop constipation. Your gums may bleed and may be sensitive to brushing and flossing. Other changes You may tire easily. You may urinate more often. Your menstrual periods will stop. You may have a loss of appetite. You may develop cravings for certain kinds of food. You may have changes in your emotions from  day to day. You may have more vivid and strange dreams. Follow these instructions at home: Medicines Follow your health care provider's instructions regarding medicine use. Specific medicines may be either safe or unsafe to take during pregnancy. Do not take any medicines unless told to by your health care provider. Take a prenatal vitamin that contains at least 600 micrograms (mcg) of folic acid. Eating and drinking Eat a healthy diet that includes fresh fruits and vegetables, whole grains, good sources of protein such as meat, eggs, or tofu, and low-fat dairy products. Avoid raw meat and unpasteurized juice, milk, and cheese. These carry germs that can harm you and your baby. If you feel nauseous or you vomit: Eat 4 or 5 small meals a day instead of 3 large meals. Try eating a few soda crackers. Drink liquids between meals instead of during meals. You may need to take these actions to prevent or treat constipation: Drink enough fluid to keep your urine pale yellow. Eat foods that are high in fiber, such as beans, whole grains, and fresh fruits and vegetables. Limit foods that are high in fat and processed sugars, such as fried or sweet foods. Activity Exercise only as directed by your health care provider. Most people can continue their usual exercise routine during pregnancy. Try to exercise for 30 minutes at least 5 days a week. Stop exercising if you develop pain or cramping in the lower abdomen or lower back. Avoid  exercising if it is very hot or humid or if you are at high altitude. Avoid heavy lifting. If you choose to, you may have sex unless your health care provider tells you not to. Relieving pain and discomfort Wear a good support bra to relieve breast tenderness. Rest with your legs elevated if you have leg cramps or low back pain. If you develop bulging veins (varicose veins) in your legs: Wear support hose as told by your health care provider. Elevate your feet for 15  minutes, 3-4 times a day. Limit salt in your diet. Safety Wear your seat belt at all times when driving or riding in a car. Talk with your health care provider if someone is verbally or physically abusive to you. Talk with your health care provider if you are feeling sad or have thoughts of hurting yourself. Lifestyle Do not use hot tubs, steam rooms, or saunas. Do not douche. Do not use tampons or scented sanitary pads. Do not use herbal remedies, alcohol, illegal drugs, or medicines that are not approved by your health care provider. Chemicals in these products can harm your baby. Do not use any products that contain nicotine or tobacco, such as cigarettes, e-cigarettes, and chewing tobacco. If you need help quitting, ask your health care provider. Avoid cat litter boxes and soil used by cats. These carry germs that can cause birth defects in the baby and possibly loss of the unborn baby (fetus) by miscarriage or stillbirth. General instructions During routine prenatal visits in the first trimester, your health care provider will do a physical exam, perform necessary tests, and ask you how things are going. Keep all follow-up visits. This is important. Ask for help if you have counseling or nutritional needs during pregnancy. Your health care provider can offer advice or refer you to specialists for help with various needs. Schedule a dentist appointment. At home, brush your teeth with a soft toothbrush. Floss gently. Write down your questions. Take them to your prenatal visits. Where to find more information American Pregnancy Association: americanpregnancy.org [employer] of [employer]stetricians and Gynecologists: https://www.todd-brady.net/ Office on [employer] Health: MightyReward.co.nz Contact a health care provider if you have: Dizziness. A fever. Mild pelvic cramps, pelvic pressure, or nagging pain in the abdominal area. Nausea, vomiting, or diarrhea that lasts for 24  hours or longer. A bad-smelling vaginal discharge. Pain when you urinate. Known exposure to a contagious illness, such as chickenpox, measles, Zika virus, HIV, or hepatitis. Get help right away if you have: Spotting or bleeding from your vagina. Severe abdominal cramping or pain. Shortness of breath or chest pain. Any kind of trauma, such as from a fall or a car crash. New or increased pain, swelling, or redness in an arm or leg. Summary The first trimester of pregnancy starts on the first day of your last menstrual period until the end of week 12 (months 1 through 3). Eating 4 or 5 small meals a day rather than 3 large meals may help to relieve nausea and vomiting. Do not use any products that contain nicotine or tobacco, such as cigarettes, e-cigarettes, and chewing tobacco. If you need help quitting, ask your health care provider. Keep all follow-up visits. This is important. This information is not intended to replace advice given to you by your health care provider. Make sure you discuss any questions you have with your health care provider. Document Revised: 07/01/2019 Document Reviewed: 05/07/2019 Elsevier Patient Education  2024 [employer].

## 2023-02-05 LAB — CERVICOVAGINAL ANCILLARY ONLY
Chlamydia: NEGATIVE
Comment: NEGATIVE
Comment: NEGATIVE
Comment: NORMAL
Neisseria Gonorrhea: NEGATIVE
Trichomonas: NEGATIVE

## 2023-02-06 ENCOUNTER — Other Ambulatory Visit: Payer: Self-pay | Admitting: Family Medicine

## 2023-02-06 ENCOUNTER — Encounter: Payer: Self-pay | Admitting: Family Medicine

## 2023-02-06 DIAGNOSIS — O30041 Twin pregnancy, dichorionic/diamniotic, first trimester: Secondary | ICD-10-CM

## 2023-02-06 DIAGNOSIS — O30049 Twin pregnancy, dichorionic/diamniotic, unspecified trimester: Secondary | ICD-10-CM | POA: Insufficient documentation

## 2023-02-06 DIAGNOSIS — O0990 Supervision of high risk pregnancy, unspecified, unspecified trimester: Secondary | ICD-10-CM | POA: Insufficient documentation

## 2023-02-06 DIAGNOSIS — E559 Vitamin D deficiency, unspecified: Secondary | ICD-10-CM | POA: Insufficient documentation

## 2023-02-06 MED ORDER — CHOLECALCIFEROL 50 MCG (2000 UT) PO CAPS: 2000.0000 [IU] | ORAL_CAPSULE | Freq: Every day | ORAL | 1 refills | Status: DC

## 2023-02-06 NOTE — Progress Notes (Signed)
 Patient informed of twin gestation on [place] via phone call on 12/30  Updating referral to OBGYN to be to [facility] attending rather than midwife given high risk pregnancy  Latrelle Dodrill, MD

## 2023-02-06 NOTE — L&D Delivery Note (Signed)
 OB/GYN Faculty Practice Delivery Note  Tina Keller is a 28 y.o. [id number] s/p SVD at [redacted]w[redacted]d. She was admitted for IOL for LGA.   ROM: 2h 43m with clear fluid GBS Status:  Positive/-- (07/31 1651) > 4 hrs PCN Maximum Maternal Temperature: 98.29F  Labor Progress: Initial SVE: 2.5/50/-2. Ripening with Cytotec . Augmented with AROM. She then progressed to complete @1500 .   Delivery Date/Time: 09/15/2023 @1501   Delivery: Called to room and patient was complete and pushing. Head delivered LOA. Loose nuchal cord present, . Shoulder and body delivered in usual fashion. Infant with spontaneous cry, placed on mother's abdomen, dried and stimulated. Cord clamped x 2 after 6-minute delay, per patient request, and cut by FOB. Cord blood drawn. Placenta delivered spontaneously with gentle cord traction. Fundus firm with massage and Pitocin . Labia, perineum, vagina, and cervix inspected with hemostatic non-parallel bilateral labial abrasions. Not repaired.  Baby Weight: 3900g  Placenta: 3 vessel, intact. Sent to L&D Complications: None Lacerations: None EBL: 213 mL Analgesia: Epidural   Infant:  APGAR (1 MIN):   APGAR (5 MINS):    [name], MD OB Family Medicine Fellow, [facility] for [facility], [facility] 09/15/2023, 3:23 PM

## 2023-02-07 LAB — CBC/D/PLT+RPR+RH+ABO+RUBIGG...
Antibody Screen: NEGATIVE
Basophils Absolute: 0 10*3/uL (ref 0.0–0.2)
Basos: 0 %
EOS (ABSOLUTE): 0 10*3/uL (ref 0.0–0.4)
Eos: 0 %
Glucose, UA: NEGATIVE
HCV Ab: NONREACTIVE
HIV Screen 4th Generation wRfx: NONREACTIVE
Hematocrit: 37.4 % (ref 34.0–46.6)
Hemoglobin: 12.2 g/dL (ref 11.1–15.9)
Hepatitis B Surface Ag: NEGATIVE
Immature Grans (Abs): 0 10*3/uL (ref 0.0–0.1)
Immature Granulocytes: 0 %
Leukocytes,UA: NEGATIVE
Lymphocytes Absolute: 2.2 10*3/uL (ref 0.7–3.1)
Lymphs: 28 %
MCH: 27.2 pg (ref 26.6–33.0)
MCHC: 32.6 g/dL (ref 31.5–35.7)
MCV: 83 fL (ref 79–97)
Monocytes Absolute: 0.6 10*3/uL (ref 0.1–0.9)
Monocytes: 8 %
Neutrophils Absolute: 4.9 10*3/uL (ref 1.4–7.0)
Neutrophils: 64 %
Nitrite, UA: NEGATIVE
Platelets: 270 10*3/uL (ref 150–450)
RBC, UA: NEGATIVE
RBC: 4.49 x10E6/uL (ref 3.77–5.28)
RDW: 13.7 % (ref 11.7–15.4)
RPR Ser Ql: NONREACTIVE
Rh Factor: POSITIVE
Rubella Antibodies, IGG: 1.34 {index} (ref >0.99)
Specific Gravity, UA: >=1.03 — AB (ref 1.005–1.030)
Urobilinogen, Ur: 1 mg/dL (ref 0.2–1.0)
WBC: 7.8 10*3/uL (ref 3.4–10.8)
pH, UA: 6 (ref 5.0–7.5)

## 2023-02-07 LAB — MICROSCOPIC EXAMINATION
Casts: NONE SEEN /LPF
Epithelial Cells (non renal): >10 /[HPF] — AB (ref 0–10)
WBC, UA: NONE SEEN /HPF (ref 0–5)

## 2023-02-07 LAB — URINE CULTURE, OB REFLEX

## 2023-02-07 LAB — HCV INTERPRETATION

## 2023-03-07 ENCOUNTER — Other Ambulatory Visit: Payer: Self-pay | Admitting: Family Medicine

## 2023-03-07 ENCOUNTER — Ambulatory Visit (HOSPITAL_COMMUNITY)
Admission: RE | Admit: 2023-03-07 | Discharge: 2023-03-07 | Disposition: A | Discharge status: Home / Self Care | Payer: Commercial Managed Care - HMO | Source: Ambulatory Visit | Attending: Family Medicine | Admitting: Family Medicine

## 2023-03-07 ENCOUNTER — Ambulatory Visit: Payer: Commercial Managed Care - HMO | Admitting: Family Medicine

## 2023-03-07 ENCOUNTER — Encounter: Payer: Self-pay | Admitting: Family Medicine

## 2023-03-07 VITALS — BP 115/72 | HR 82 | Temp 98.1°F | Wt 290.4 lb

## 2023-03-07 DIAGNOSIS — O30041 Twin pregnancy, dichorionic/diamniotic, first trimester: Secondary | ICD-10-CM

## 2023-03-07 DIAGNOSIS — Z3A12 12 weeks gestation of pregnancy: Secondary | ICD-10-CM

## 2023-03-07 LAB — POCT 1 HR PRENATAL GLUCOSE: Glucose 1 Hr Prenatal, POC: 103 mg/dL

## 2023-03-07 MED ORDER — ASPIRIN 81 MG PO TBEC
81.0000 mg | DELAYED_RELEASE_TABLET | Freq: Every day | ORAL | 1 refills | Status: DC
Start: 2023-03-07 — End: 2023-09-16

## 2023-03-07 NOTE — Patient Instructions (Addendum)
We are getting an ultrasound to look at both babies.  Arrive at 2:30 today at [facility] with full bladder  Be well, Dr. Pollie Meyer

## 2023-03-07 NOTE — Progress Notes (Signed)
[facility] Routine Faculty OB Visit  Tina Keller is a 28 y.o. G3P2002 at [redacted]w[redacted]d (via 8w u/s) who presents for routine follow up.   Denies cramping/ctx, fluid leaking, vaginal bleeding, or decreased fetal movement. Taking PNV.  Nausea has improved significantly since last visit. Never got diclegis due to insurance issues.  [place] after last visit showed di-di twins, has appointment with [facility] next month to establish there.  POCUS exam - limitations of point of care ultrasound reviewed with patient prior to exam Bedside US demonstrates one large, active fetus with normal appearing cardiac activity, and in separate amniotic sac a much smaller appearing embryo without detectable movement or cardiac activity.   Assessment & Plan  1. Routine prenatal care: - early 1hr GTT done today, normal - had planned for NIPT today but given POCUS findings will hold off - advised to begin taking aspirin 81mg  daily - declines COVID and flu vaccines  2. Di/Di twin pregnancy, with concern for demise of one fetus - POCUS today shows concern for intrauterine demise of one twin. - ordered stat ultrasound to evaluate/confirm, scheduled at 3pm today at [facility] - spoke with Dr. Adrian Blackwater of OB/GYN who advises there is no specific management needed for demise of single twin at this gestation; typically body gradually reabsorbs fetal tissue  3. Vitamin D deficiency - patient stopped the weekly high dose regimen after last visit; advised her of smaller daily dose sent in - recheck vitamin D level today with 1hr GTT labs  4. Depression/anxiety - stable on escitalopram, continue - supportive words offered regarding suspected demise of one twin  Levert Feinstein, MD, [facility]    --------  ADDENDUM 3:30p: I was contacted by sonographer that formal ultrasound images were ready but radiologist had not yet formally read. I spoke with patient via phone  (she was still at appointment) to let her know that images appear to confirm demise of one twin but that we were waiting for radiologist to formally read the scan. Offered to call patient and let her know final result, or send her a message - she asked that I send her a message on mychart once formal read is back.

## 2023-03-08 LAB — VITAMIN D 25 HYDROXY (VIT D DEFICIENCY, FRACTURES): Vit D, 25-Hydroxy: 25.6 ng/mL — ABNORMAL LOW (ref 30.0–100.0)

## 2023-03-09 ENCOUNTER — Encounter: Payer: Self-pay | Admitting: Family Medicine

## 2023-03-09 DIAGNOSIS — O3120X0 Continuing pregnancy after intrauterine death of one fetus or more, unspecified trimester, not applicable or unspecified: Secondary | ICD-10-CM | POA: Insufficient documentation

## 2023-04-04 ENCOUNTER — Ambulatory Visit: Payer: Commercial Managed Care - HMO | Admitting: Obstetrics and Gynecology

## 2023-04-04 ENCOUNTER — Encounter: Payer: Self-pay | Admitting: Obstetrics and Gynecology

## 2023-04-04 VITALS — BP 103/68 | HR 74 | Wt 290.0 lb

## 2023-04-04 DIAGNOSIS — Z3A16 16 weeks gestation of pregnancy: Secondary | ICD-10-CM | POA: Diagnosis not present

## 2023-04-04 DIAGNOSIS — Z1339 Encounter for screening examination for other mental health and behavioral disorders: Secondary | ICD-10-CM

## 2023-04-04 DIAGNOSIS — O99212 Obesity complicating pregnancy, second trimester: Secondary | ICD-10-CM

## 2023-04-04 DIAGNOSIS — O3122X0 Continuing pregnancy after intrauterine death of one fetus or more, second trimester, not applicable or unspecified: Secondary | ICD-10-CM

## 2023-04-04 DIAGNOSIS — O30042 Twin pregnancy, dichorionic/diamniotic, second trimester: Secondary | ICD-10-CM | POA: Diagnosis not present

## 2023-04-04 DIAGNOSIS — O99213 Obesity complicating pregnancy, third trimester: Secondary | ICD-10-CM

## 2023-04-04 DIAGNOSIS — O3110X0 Continuing pregnancy after spontaneous abortion of one fetus or more, unspecified trimester, not applicable or unspecified: Secondary | ICD-10-CM | POA: Insufficient documentation

## 2023-04-04 NOTE — Patient Instructions (Signed)
   Considering Waterbirth? Guide for patients at Center for [facility]) Why consider waterbirth? Gentle birth for babies  Less pain medicine used in labor  May allow for passive descent/less pushing  May reduce perineal tears  More mobility and instinctive maternal position changes  Increased maternal relaxation   Is waterbirth safe? What are the risks of infection, drowning or other complications? Infection:  Very low risk (3.7 % for tub vs 4.8% for bed)  7 in 8000 waterbirths with documented infection  Poorly cleaned equipment most common cause  Slightly lower group B strep transmission rate  Drowning  Maternal:  Very low risk  Related to seizures or fainting  Newborn:  Very low risk. No evidence of increased risk of respiratory problems in multiple large studies  Physiological protection from breathing under water  Avoid underwater birth if there are any fetal complications  Once baby's head is out of the water, keep it out.  Birth complication  Some reports of cord trauma, but risk decreased by bringing baby to surface gradually  No evidence of increased risk of shoulder dystocia. Mothers can usually change positions faster in water than in a bed, possibly aiding the maneuvers to free the shoulder.   There are 2 things you MUST do to have a waterbirth with [facility]: Attend a waterbirth class at [employer] [facility] at [facility]   3rd Wednesday of every month from 7-9 pm (virtual during COVID) [employer] at [email or web address] or [email or web address] or by calling [phone] Bring [place] the certificate from the class to your prenatal appointment or send via MyChart Meet with a midwife at 36 weeks* to see if you can still plan a waterbirth and to sign the consent.   *We also recommend that you schedule as many of your prenatal visits with a midwife as possible.    Helpful information: You may want to bring a bathing suit top to the hospital  to wear during labor but this is optional.  All other supplies are provided by the hospital. Please arrive at the hospital with signs of active labor, and do not wait at home until late in labor. It takes 45 min- 1 hour for fetal monitoring, and check in to your room to take place, plus transport and filling of the waterbirth tub.    Things that would prevent you from having a waterbirth: Premature, <37wks  Previous cesarean birth  Presence of thick meconium-stained fluid  Multiple gestation (Twins, triplets, etc.)  Uncontrolled diabetes or gestational diabetes requiring medication  Hypertension diagnosed in pregnancy or preexisting hypertension (gestational hypertension, preeclampsia, or chronic hypertension) Fetal growth restriction (your baby measures less than 10th percentile on ultrasound) Heavy vaginal bleeding  Non-reassuring fetal heart rate  Active infection (MRSA, etc.). Group B Strep is NOT a contraindication for waterbirth.  If your labor has to be induced and induction method requires continuous monitoring of the baby's heart rate  Other risks/issues identified by your obstetrical provider   Please remember that birth is unpredictable. Under certain unforeseeable circumstances your provider may advise against giving birth in the tub. These decisions will be made on a case-by-case basis and with the safety of you and your baby as our highest priority.    Updated 05/10/21

## 2023-04-04 NOTE — Progress Notes (Signed)
   PRENATAL VISIT NOTE  Subjective:  Tina Keller is a 28 y.o. G3P2002 at [redacted]w[redacted]d being seen today for ongoing prenatal care.  Patient is transferring care form [facility]. She is currently monitored for the following issues for this high-risk pregnancy and has Obesity in pregnancy, antepartum, third trimester; MDD (major depressive disorder); Dichorionic diamniotic twin pregnancy; Vitamin D deficiency; Twin pregnancy with single intrauterine death; and Vanishing twin syndrome on their problem list.  Patient reports no complaints.  Contractions: Not present. Vag. Bleeding: None.   . Denies leaking of fluid.   The following portions of the patient's history were reviewed and updated as appropriate: allergies, current medications, past family history, past medical history, past social history, past surgical history and problem list.   Objective:   Vitals:   04/04/23 1442  BP: 103/68  Pulse: 74  Weight: 290 lb (131.5 kg)    Fetal Status: Fetal Heart Rate (bpm): 151         General:  Alert, oriented and cooperative. Patient is in no acute distress.  Skin: Skin is warm and dry. No rash noted.   Cardiovascular: Normal heart rate noted  Respiratory: Normal respiratory effort, no problems with respiration noted  Abdomen: Soft, gravid, appropriate for gestational age.  Pain/Pressure: Absent     Pelvic: Cervical exam deferred        Extremities: Normal range of motion.  Edema: None  Mental Status: Normal mood and affect. Normal behavior. Normal judgment and thought content.   Assessment and Plan:  Pregnancy: G3P2002 at [redacted]w[redacted]d 1. Vanishing twin syndrome (Primary) Patient is doing well without complaints AFP today Anatomy ultrasound ordered Patient is interested in waterbirth. Information provided  2. Twin pregnancy with single intrauterine death in second trimester, single or unspecified fetus Demise of one twin around 8 week  3. Obesity in pregnancy, antepartum, third trimester Continue  ASA A1C added today  Preterm labor symptoms and general obstetric precautions including but not limited to vaginal bleeding, contractions, leaking of fluid and fetal movement were reviewed in detail with the patient. Please refer to After Visit Summary for other counseling recommendations.   Return in about 4 weeks (around 05/02/2023) for in person, ROB, High risk.  No future appointments.  Catalina Antigua, MD

## 2023-04-04 NOTE — Progress Notes (Signed)
 Xfer NOB/AFP.  Reports no concerns today.

## 2023-04-05 LAB — HEMOGLOBIN A1C
Est. average glucose Bld gHb Est-mCnc: 126 mg/dL
Hgb A1c MFr Bld: 6 % — ABNORMAL HIGH (ref 4.8–5.6)

## 2023-04-06 LAB — AFP, SERUM, OPEN SPINA BIFIDA
AFP MoM: 1.8
AFP Value: 45.3 ng/mL
Gest. Age on Collection Date: 16.1 wk
Maternal Age At EDD: 28.1 a
OSBR Risk 1 IN: 5129
Test Results:: NEGATIVE
Weight: 290 [lb_av]

## 2023-04-11 ENCOUNTER — Other Ambulatory Visit: Payer: Commercial Managed Care - HMO

## 2023-04-16 ENCOUNTER — Other Ambulatory Visit

## 2023-04-16 DIAGNOSIS — Z3A17 17 weeks gestation of pregnancy: Secondary | ICD-10-CM

## 2023-04-17 ENCOUNTER — Encounter: Payer: Self-pay | Admitting: Obstetrics and Gynecology

## 2023-04-17 LAB — GLUCOSE TOLERANCE, 2 HOURS W/ 1HR
Glucose, 1 hour: 101 mg/dL (ref 70–179)
Glucose, 2 hour: 105 mg/dL (ref 70–152)
Glucose, Fasting: 88 mg/dL (ref 70–91)

## 2023-04-29 ENCOUNTER — Ambulatory Visit (INDEPENDENT_AMBULATORY_CARE_PROVIDER_SITE_OTHER): Admitting: Student

## 2023-04-29 ENCOUNTER — Other Ambulatory Visit (HOSPITAL_COMMUNITY)
Admission: RE | Admit: 2023-04-29 | Discharge: 2023-04-29 | Disposition: A | Discharge status: Home / Self Care | Source: Ambulatory Visit | Attending: Family Medicine | Admitting: Family Medicine

## 2023-04-29 VITALS — BP 118/80 | HR 90 | Ht 67.5 in | Wt 288.4 lb

## 2023-04-29 DIAGNOSIS — B9689 Other specified bacterial agents as the cause of diseases classified elsewhere: Secondary | ICD-10-CM

## 2023-04-29 DIAGNOSIS — Z113 Encounter for screening for infections with a predominantly sexual mode of transmission: Secondary | ICD-10-CM | POA: Insufficient documentation

## 2023-04-29 DIAGNOSIS — N760 Acute vaginitis: Secondary | ICD-10-CM

## 2023-04-29 LAB — POCT WET PREP (WET MOUNT)
Clue Cells Wet Prep Whiff POC: POSITIVE
Trichomonas Wet Prep HPF POC: ABSENT

## 2023-04-29 NOTE — Progress Notes (Signed)
  SUBJECTIVE:   CHIEF COMPLAINT / HPI:   STD testing: The patient, who is currently [redacted] weeks pregnant, presents for STD testing due to concerns raised by her partner. She reports a couple of weeks of increased, creamy white vaginal discharge, which she initially attributed to pregnancy. Her partner, however, has reported a 'funny' feeling during urination and clear discharge, prompting the visit.  She denies having any other sexual partners however is unsure about her current sexual partners history.  The patient's last STD test was in December, as part of initial pregnancy labs, and was negative. She denies any vaginal bleeding, contractions, or large gushes of fluid, and confirms feeling fetal movement.  Of note, patient is actively pregnant with twins however born with early fetal demise.   PERTINENT  PMH / PSH: MDD, history of twin pregnancy  OBJECTIVE:  BP 118/80   Pulse 90   Ht 5' 7.5" (1.715 m)   Wt 288 lb 6.4 oz (130.8 kg)   LMP 12/12/2022 (Exact Date)   SpO2 99%   BMI 44.50 kg/m  Pelvic exam: normal external genitalia, vulva, vagina, cervix, uterus and adnexa, VULVA: normal appearing vulva with no masses, tenderness or lesions, VAGINA: vaginal discharge - white, creamy, and milky, CERVIX: unable to visualize, exam chaperoned by Jake Seats, CMA.   ASSESSMENT/PLAN:   Assessment & Plan Screening for STD (sexually transmitted disease) Differential diagnosis includes possible STIs such as chlamydia or gonorrhea, but she is asymptomatic except for the discharge. -Testing for gonorrhea, Claudia, trichomonas -Wet prep -Continue pregnancy care with OB/GYN Return if symptoms worsen or fail to improve. Shelby Mattocks, DO 04/29/2023, 11:26 AM PGY-3, [facility]

## 2023-04-29 NOTE — Patient Instructions (Addendum)
 It was great to see you today! Thank you for choosing [facility] for your primary care.  Today we addressed: We will check for STDs.  Please continue care regarding her pregnancy with your OB/GYN.  If you haven't already, sign up for My Chart to have easy access to your labs results, and communication with your primary care physician. We are checking some labs today. If they are abnormal, I will call you. If they are normal, I will send you a MyChart message (if it is active) or a letter in the mail. If you do not hear about your labs in the next 2 weeks, please call the office. Return if symptoms worsen or fail to improve. Please arrive 15 minutes before your appointment to ensure smooth check in process.  We appreciate your efforts in making this happen.  Thank you for allowing me to participate in your care, Shelby Mattocks, DO 04/29/2023, 11:15 AM PGY-3, [facility]

## 2023-04-30 ENCOUNTER — Encounter: Payer: Self-pay | Admitting: Student

## 2023-04-30 LAB — CERVICOVAGINAL ANCILLARY ONLY
Chlamydia: NEGATIVE
Comment: NEGATIVE
Comment: NORMAL
Neisseria Gonorrhea: NEGATIVE

## 2023-04-30 MED ORDER — METRONIDAZOLE 500 MG PO TABS: 500.0000 mg | ORAL_TABLET | Freq: Two times a day (BID) | ORAL | 0 refills | Status: AC

## 2023-04-30 NOTE — Addendum Note (Signed)
 Addended by: Tanya Nones on: 04/30/2023 08:49 AM   Modules accepted: Orders

## 2023-05-01 ENCOUNTER — Ambulatory Visit (INDEPENDENT_AMBULATORY_CARE_PROVIDER_SITE_OTHER)

## 2023-05-01 DIAGNOSIS — Z111 Encounter for screening for respiratory tuberculosis: Secondary | ICD-10-CM

## 2023-05-01 NOTE — Progress Notes (Signed)
 Patient presents to nurse clinic for PPD placement.  PPD placed without complication.  Patient to return on 3/28 to have site read.

## 2023-05-02 ENCOUNTER — Ambulatory Visit (INDEPENDENT_AMBULATORY_CARE_PROVIDER_SITE_OTHER): Payer: Commercial Managed Care - HMO | Admitting: Obstetrics and Gynecology

## 2023-05-02 VITALS — BP 126/85 | HR 82 | Wt 289.0 lb

## 2023-05-02 DIAGNOSIS — O0990 Supervision of high risk pregnancy, unspecified, unspecified trimester: Secondary | ICD-10-CM

## 2023-05-02 DIAGNOSIS — Z3A20 20 weeks gestation of pregnancy: Secondary | ICD-10-CM | POA: Diagnosis not present

## 2023-05-02 DIAGNOSIS — O3110X0 Continuing pregnancy after spontaneous abortion of one fetus or more, unspecified trimester, not applicable or unspecified: Secondary | ICD-10-CM | POA: Diagnosis not present

## 2023-05-02 DIAGNOSIS — O99213 Obesity complicating pregnancy, third trimester: Secondary | ICD-10-CM | POA: Diagnosis not present

## 2023-05-02 NOTE — Progress Notes (Signed)
 Concern about baby development. No other problems.

## 2023-05-02 NOTE — Progress Notes (Signed)
   PRENATAL VISIT NOTE  Subjective:  Tina Keller is a 28 y.o. G3P2002 at [redacted]w[redacted]d being seen today for ongoing prenatal care.  She is currently monitored for the following issues for this high-risk pregnancy and has Obesity in pregnancy, antepartum, third trimester; MDD (major depressive disorder); Dichorionic diamniotic twin pregnancy; Vitamin D deficiency; Twin pregnancy with single intrauterine death; and Vanishing twin syndrome on their problem list.  Patient reports no complaints.  Contractions: Not present. Vag. Bleeding: None.  Movement: Present. Denies leaking of fluid.   The following portions of the patient's history were reviewed and updated as appropriate: allergies, current medications, past family history, past medical history, past social history, past surgical history and problem list.   Objective:   Vitals:   05/02/23 1423  BP: 126/85  Pulse: 82  Weight: 289 lb (131.1 kg)    Fetal Status: Fetal Heart Rate (bpm): 150   Movement: Present     General:  Alert, oriented and cooperative. Patient is in no acute distress.  Skin: Skin is warm and dry. No rash noted.   Cardiovascular: Normal heart rate noted  Respiratory: Normal respiratory effort, no problems with respiration noted  Abdomen: Soft, gravid, appropriate for gestational age.  Pain/Pressure: Absent     Pelvic: Cervical exam deferred        Extremities: Normal range of motion.  Edema: None  Mental Status: Normal mood and affect. Normal behavior. Normal judgment and thought content.   Assessment and Plan:  Pregnancy: G3P2002 at [redacted]w[redacted]d 1. Supervision of high risk pregnancy, antepartum (Primary) BP and FHR normal Doing well, feeling regular movement    2. [redacted] weeks gestation of pregnancy Desires screening today  Anatomy u/s tomorrow  - PANORAMA PRENATAL TEST - CBC  3. Obesity in pregnancy, antepartum, third trimester Following MFM Continue ASA Early gtt normal, repeat 26-28 weeks  4. Vanishing twin  syndrome   Preterm labor symptoms and general obstetric precautions including but not limited to vaginal bleeding, contractions, leaking of fluid and fetal movement were reviewed in detail with the patient. Please refer to After Visit Summary for other counseling recommendations.   Return in about 4 weeks (around 05/30/2023) for OB VISIT (MD or APP).  Future Appointments  Date Time Provider Department Center  05/03/2023  9:30 AM [facility] NURSE [facility]  05/03/2023 10:00 AM [facility] NURSE [facility]  05/03/2023 10:15 AM [facility] PROVIDER 1 [facility]  05/03/2023 10:30 AM [facility] US5 [facility]  05/30/2023  1:30 PM Sue Lush, FNP [facility] None    Albertine Grates, FNP

## 2023-05-03 ENCOUNTER — Other Ambulatory Visit: Payer: Self-pay | Admitting: *Deleted

## 2023-05-03 ENCOUNTER — Ambulatory Visit

## 2023-05-03 ENCOUNTER — Ambulatory Visit: Payer: Commercial Managed Care - HMO | Attending: Obstetrics and Gynecology

## 2023-05-03 ENCOUNTER — Encounter: Payer: Self-pay | Admitting: *Deleted

## 2023-05-03 ENCOUNTER — Ambulatory Visit (HOSPITAL_BASED_OUTPATIENT_CLINIC_OR_DEPARTMENT_OTHER): Payer: Commercial Managed Care - HMO | Admitting: Obstetrics

## 2023-05-03 ENCOUNTER — Ambulatory Visit: Payer: Commercial Managed Care - HMO | Admitting: *Deleted

## 2023-05-03 VITALS — BP 124/63 | HR 73

## 2023-05-03 DIAGNOSIS — O30002 Twin pregnancy, unspecified number of placenta and unspecified number of amniotic sacs, second trimester: Secondary | ICD-10-CM

## 2023-05-03 DIAGNOSIS — O99212 Obesity complicating pregnancy, second trimester: Secondary | ICD-10-CM | POA: Diagnosis present

## 2023-05-03 DIAGNOSIS — E669 Obesity, unspecified: Secondary | ICD-10-CM

## 2023-05-03 DIAGNOSIS — O3122X0 Continuing pregnancy after intrauterine death of one fetus or more, second trimester, not applicable or unspecified: Secondary | ICD-10-CM

## 2023-05-03 DIAGNOSIS — O3110X0 Continuing pregnancy after spontaneous abortion of one fetus or more, unspecified trimester, not applicable or unspecified: Secondary | ICD-10-CM | POA: Diagnosis present

## 2023-05-03 DIAGNOSIS — Z3A20 20 weeks gestation of pregnancy: Secondary | ICD-10-CM

## 2023-05-03 DIAGNOSIS — O364XX0 Maternal care for intrauterine death, not applicable or unspecified: Secondary | ICD-10-CM

## 2023-05-03 DIAGNOSIS — Z111 Encounter for screening for respiratory tuberculosis: Secondary | ICD-10-CM

## 2023-05-03 LAB — TB SKIN TEST
Induration: 0 mm
TB Skin Test: NEGATIVE

## 2023-05-03 LAB — CBC
Hematocrit: 33.8 % — ABNORMAL LOW (ref 34.0–46.6)
Hemoglobin: 11.2 g/dL (ref 11.1–15.9)
MCH: 27.8 pg (ref 26.6–33.0)
MCHC: 33.1 g/dL (ref 31.5–35.7)
MCV: 84 fL (ref 79–97)
Platelets: 223 10*3/uL (ref 150–450)
RBC: 4.03 x10E6/uL (ref 3.77–5.28)
RDW: 13.4 % (ref 11.7–15.4)
WBC: 9.6 10*3/uL (ref 3.4–10.8)

## 2023-05-03 NOTE — Progress Notes (Signed)
PPD Reading Note PPD read and results entered in EpicCare. Result: 0 mm induration. Interpretation: Negative Allergic reaction: No  

## 2023-05-03 NOTE — Progress Notes (Signed)
 MFM Consult Note  Tina Keller is currently at 20 weeks and 2 days.  She was seen due to maternal obesity with a BMI of 47.  This pregnancy started out as a dichorionic, diamniotic twin gestation.  Unfortunately, a demise of twin B was noted at around 12 weeks.  She denies any other problems in her current pregnancy.    She just had a cell free DNA test drawn yesterday to screen for fetal aneuploidy.  She was informed that the fetal growth and amniotic fluid level were appropriate for her gestational age.   There were no obvious fetal anomalies noted on today's ultrasound exam.  The patient was informed that anomalies may be missed due to technical limitations. If the fetus is in a suboptimal position or maternal habitus is increased, visualization of the fetus in the maternal uterus may be impaired.  The following were discussed during today's consultation:  Dichorionic, diamniotic twin gestation with demise of one twin in the first trimester  The patient was advised that the remnants of the demised twin was not visualized today.    She was reassured as this was a dichorionic, diamniotic twin gestation, the demise of one fetus will not have any effects on the other.    We will treat her pregnancy as a singleton gestation from now on.  Maternal obesity in pregnancy  Her early screen for gestational diabetes was negative.  She should be screened for gestational diabetes again at 28 weeks.  Due to maternal obesity, we will continue to follow her with growth ultrasounds throughout her pregnancy.    Weekly fetal testing should be started at around 34 weeks and continued until delivery.  She should have an anesthesia consult when she is admitted for delivery.    A follow-up exam was scheduled in 6 weeks.    The patient stated that all of her questions were answered today.  A total of 45 minutes was spent counseling and coordinating the care for this patient.  Greater than 50% of  the time was spent in direct face-to-face contact.

## 2023-05-08 LAB — PANORAMA PRENATAL TEST FULL PANEL:PANORAMA TEST PLUS 5 ADDITIONAL MICRODELETIONS: FETAL FRACTION: 13.3

## 2023-05-30 ENCOUNTER — Encounter: Admitting: Obstetrics and Gynecology

## 2023-05-31 ENCOUNTER — Ambulatory Visit: Admitting: Obstetrics

## 2023-05-31 VITALS — BP 124/70 | HR 76 | Wt 287.0 lb

## 2023-05-31 DIAGNOSIS — J301 Allergic rhinitis due to pollen: Secondary | ICD-10-CM | POA: Diagnosis not present

## 2023-05-31 DIAGNOSIS — K219 Gastro-esophageal reflux disease without esophagitis: Secondary | ICD-10-CM

## 2023-05-31 DIAGNOSIS — O3122X0 Continuing pregnancy after intrauterine death of one fetus or more, second trimester, not applicable or unspecified: Secondary | ICD-10-CM | POA: Diagnosis not present

## 2023-05-31 DIAGNOSIS — O0990 Supervision of high risk pregnancy, unspecified, unspecified trimester: Secondary | ICD-10-CM

## 2023-05-31 MED ORDER — PANTOPRAZOLE SODIUM 40 MG PO TBEC: 40.0000 mg | DELAYED_RELEASE_TABLET | Freq: Every day | ORAL | 5 refills | Status: DC

## 2023-05-31 MED ORDER — PANTOPRAZOLE SODIUM 40 MG PO TBEC: 40.0000 mg | DELAYED_RELEASE_TABLET | Freq: Two times a day (BID) | ORAL | 5 refills | Status: DC

## 2023-05-31 MED ORDER — LORATADINE 10 MG PO TABS: 10.0000 mg | ORAL_TABLET | Freq: Every day | ORAL | 11 refills | Status: DC

## 2023-05-31 NOTE — Addendum Note (Signed)
 Addended by: Gabrielle Joiner on: 05/31/2023 03:20 PM   Modules accepted: Orders

## 2023-05-31 NOTE — Progress Notes (Signed)
 Denies problems today.

## 2023-05-31 NOTE — Progress Notes (Signed)
 Subjective:  Tina Keller is a 28 y.o. G3P2002 at [redacted]w[redacted]d being seen today for ongoing prenatal care.  She is currently monitored for the following issues for this high-risk pregnancy and has Obesity in pregnancy, antepartum, third trimester; MDD (major depressive disorder); Supervision of high risk pregnancy, antepartum; Vitamin D  deficiency; Twin pregnancy with single intrauterine death; and Vanishing twin syndrome on their problem list.  Patient reports heartburn and seasonal allergies .  Contractions: Not present. Vag. Bleeding: None.  Movement: Present. Denies leaking of fluid.   The following portions of the patient's history were reviewed and updated as appropriate: allergies, current medications, past family history, past medical history, past social history, past surgical history and problem list. Problem list updated.  Objective:   Vitals:   05/31/23 0842  Weight: 287 lb (130.2 kg)    Fetal Status: Fetal Heart Rate (bpm): 138   Movement: Present     General:  Alert, oriented and cooperative. Patient is in no acute distress.  Skin: Skin is warm and dry. No rash noted.   Cardiovascular: Normal heart rate noted  Respiratory: Normal respiratory effort, no problems with respiration noted  Abdomen: Soft, gravid, appropriate for gestational age. Pain/Pressure: Absent     Pelvic:  Cervical exam deferred        Extremities: Normal range of motion.  Edema: None  Mental Status: Normal mood and affect. Normal behavior. Normal judgment and thought content.   Urinalysis:      Assessment and Plan:  Pregnancy: G3P2002 at [redacted]w[redacted]d  1. Supervision of high risk pregnancy, antepartum (Primary)  2. Twin pregnancy with single intrauterine death in second trimester, single or unspecified fetus  3. GERD without esophagitis Rx: - pantoprazole (PROTONIX) 40 MG tablet; Take 1 tablet (40 mg total) by mouth daily.  Dispense: 60 tablet; Refill: 5  4. Seasonal allergic rhinitis due to pollen Rx: -  loratadine (CLARITIN) 10 MG tablet; Take 1 tablet (10 mg total) by mouth daily.  Dispense: 30 tablet; Refill: 11    Preterm labor symptoms and general obstetric precautions including but not limited to vaginal bleeding, contractions, leaking of fluid and fetal movement were reviewed in detail with the patient. Please refer to After Visit Summary for other counseling recommendations.   Return in about 4 weeks (around 06/28/2023) for [facility].   Gabrielle Joiner, MD 05/31/2023

## 2023-06-14 ENCOUNTER — Ambulatory Visit

## 2023-06-14 ENCOUNTER — Ambulatory Visit: Attending: Obstetrics

## 2023-06-24 ENCOUNTER — Telehealth: Payer: Self-pay | Admitting: Licensed Clinical Social Worker

## 2023-06-24 NOTE — Telephone Encounter (Signed)
 [facility] contacted patient on this date to provide [facility] intro and to schedule [facility] appointment. Appointment scheduled for 06/27/2023.

## 2023-06-27 ENCOUNTER — Ambulatory Visit: Payer: Self-pay | Admitting: Licensed Clinical Social Worker

## 2023-06-27 DIAGNOSIS — F32A Depression, unspecified: Secondary | ICD-10-CM

## 2023-06-27 NOTE — BH Specialist Note (Unsigned)
 Integrated Behavioral Health via Telemedicine Visit  07/05/2023 Tina Keller [id number]  Number of Integrated Behavioral Health Clinician visits: 1- Initial Visit  Session Start time: 1315   Session End time: 1414  Total time in minutes: 59    Referring Provider: Terressa Fess Patient/Family location: Home [facility] Provider location: Remote Office All persons participating in visit: [facility] and Patient Types of Service: Individual psychotherapy and Video visit  I connected with Tina Keller and/or Tina Keller's patient via  Telephone or Engineer, civil (consulting)  (Video is Caregility application) and verified that I am speaking with the correct person using two identifiers. Discussed confidentiality: Yes   I discussed the limitations of telemedicine and the availability of in person appointments.  Discussed there is a possibility of technology failure and discussed alternative modes of communication if that failure occurs.  I discussed that engaging in this telemedicine visit, they consent to the provision of behavioral healthcare and the services will be billed under their insurance.  Patient and/or legal guardian expressed understanding and consented to Telemedicine visit: Yes   Presenting Concerns: Patient and/or family reports the following symptoms/concerns: increased symptoms of depression. Recent breakup.  Duration of problem: Months; Severity of problem: moderate  Patient and/or Family's Strengths/Protective Factors: Social and Emotional competence, Concrete supports in place (healthy food, safe environments, etc.), Caregiver has knowledge of parenting & child development, and Parental Resilience  Goals Addressed: Patient will:  Reduce symptoms of: depression   Increase knowledge and/or ability of: coping skills and healthy habits   Demonstrate ability to: Increase healthy adjustment to current life circumstances and Increase adequate support systems for  patient/family  Progress towards Goals: Ongoing     Interventions: Interventions utilized:  Supportive Counseling, Psychoeducation and/or Health Education, and Supportive Reflection Standardized Assessments completed: Not Needed    Patient and/or Family Response: The patient was present for today's session and reported increased psychosocial stressors and depressive symptoms related to a recent breakup and separation from her children's father. She shared that, as a result of the separation, she and her children have moved into her mother's home to gain more stability and support. The patient expressed that she is currently in a process of self-discovery, actively seeking employment, and working to redefine her identity and responsibilities in order to provide a better future for her children. 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 The patient acknowledged the emotional and practical difficulties of this transition for both herself and her children.  Clinical Assessment/Diagnosis  Perinatal depression in third trimester    Assessment: Patient currently experiencing increased stress and symptoms of depression related to a recent breakup and major life transition, including relocating with her children and adjusting to a new lifestyle. She is also navigating a period of identity redefinition and seeking stability through employment and family support..   Patient may benefit from continued support of integrated behavioral health.  Plan: Follow up with behavioral health clinician on : 07/04/2023 Behavioral recommendations: Continue engaging in therapy to process the emotional impact of the breakup and support adjustment to new life roles and responsibilities. Establish short-term goals related to employment and self-care to  promote stability and enhance your sense of purpose and well-being.  Referral(s): Integrated [employer] (In Clinic)  I discussed the assessment and treatment plan with the patient and/or parent/guardian. They were provided an opportunity to ask  questions and all were answered. They agreed with the plan and demonstrated an understanding of the instructions.   They were advised to call back or seek an in-person evaluation if the symptoms worsen or if the condition fails to improve as anticipated.  Keller Mikels Alfonza Iles, LCSWA

## 2023-06-28 ENCOUNTER — Encounter: Payer: Self-pay | Admitting: Obstetrics

## 2023-06-28 ENCOUNTER — Ambulatory Visit: Admitting: Obstetrics

## 2023-06-28 ENCOUNTER — Other Ambulatory Visit

## 2023-06-28 VITALS — BP 126/83 | HR 82 | Wt 281.8 lb

## 2023-06-28 DIAGNOSIS — O0990 Supervision of high risk pregnancy, unspecified, unspecified trimester: Secondary | ICD-10-CM

## 2023-06-28 DIAGNOSIS — Z3A28 28 weeks gestation of pregnancy: Secondary | ICD-10-CM | POA: Diagnosis not present

## 2023-06-28 DIAGNOSIS — O3121X0 Continuing pregnancy after intrauterine death of one fetus or more, first trimester, not applicable or unspecified: Secondary | ICD-10-CM

## 2023-06-28 NOTE — Progress Notes (Signed)
 Pt presents for hob. Pt has no questions or concerns at this time.

## 2023-06-28 NOTE — Progress Notes (Signed)
 Subjective:  Tina Keller is a 28 y.o. G3P2002 at [redacted]w[redacted]d being seen today for ongoing prenatal care.  She is currently monitored for the following issues for this high-risk pregnancy and has Obesity in pregnancy, antepartum, third trimester; MDD (major depressive disorder); Supervision of high risk pregnancy, antepartum; Vitamin D  deficiency; Twin pregnancy with single intrauterine death; and Vanishing twin syndrome on their problem list.  Patient reports no complaints.  Contractions: Not present. Vag. Bleeding: None.  Movement: Present. Denies leaking of fluid.   The following portions of the patient's history were reviewed and updated as appropriate: allergies, current medications, past family history, past medical history, past social history, past surgical history and problem list. Problem list updated.  Objective:   Vitals:   06/28/23 1037  BP: 126/83  Pulse: 82  Weight: 281 lb 12.8 oz (127.8 kg)    Fetal Status: Fetal Heart Rate (bpm): 141   Movement: Present     General:  Alert, oriented and cooperative. Patient is in no acute distress.  Skin: Skin is warm and dry. No rash noted.   Cardiovascular: Normal heart rate noted  Respiratory: Normal respiratory effort, no problems with respiration noted  Abdomen: Soft, gravid, appropriate for gestational age. Pain/Pressure: Present (cramps)     Pelvic:  Cervical exam deferred        Extremities: Normal range of motion.  Edema: Trace (some days)  Mental Status: Normal mood and affect. Normal behavior. Normal judgment and thought content.   Urinalysis:      Assessment and Plan:  Pregnancy: G3P2002 at [redacted]w[redacted]d  1. Supervision of high risk pregnancy, antepartum (Primary)  2. Twin pregnancy with single intrauterine death in first trimester, single or unspecified fetus  3. [redacted] weeks gestation of pregnancy Rx: - Glucose Tolerance, 2 Hours w/1 Hour - CBC - HIV antibody (with reflex) - RPR    Preterm labor symptoms and general  obstetric precautions including but not limited to vaginal bleeding, contractions, leaking of fluid and fetal movement were reviewed in detail with the patient. Please refer to After Visit Summary for other counseling recommendations.   Return in about 2 weeks (around 07/12/2023) for [facility].   Gabrielle Joiner, MD 06/28/2023

## 2023-06-29 ENCOUNTER — Ambulatory Visit: Payer: Self-pay | Admitting: Obstetrics

## 2023-06-29 DIAGNOSIS — D508 Other iron deficiency anemias: Secondary | ICD-10-CM

## 2023-06-29 LAB — CBC
Hematocrit: 31.1 % — ABNORMAL LOW (ref 34.0–46.6)
Hemoglobin: 9.8 g/dL — ABNORMAL LOW (ref 11.1–15.9)
MCH: 27.8 pg (ref 26.6–33.0)
MCHC: 31.5 g/dL (ref 31.5–35.7)
MCV: 88 fL (ref 79–97)
Platelets: 193 10*3/uL (ref 150–450)
RBC: 3.53 x10E6/uL — ABNORMAL LOW (ref 3.77–5.28)
RDW: 13.2 % (ref 11.7–15.4)
WBC: 7.6 10*3/uL (ref 3.4–10.8)

## 2023-06-29 LAB — RPR: RPR Ser Ql: NONREACTIVE

## 2023-06-29 LAB — GLUCOSE TOLERANCE, 2 HOURS W/ 1HR
Glucose, 1 hour: 79 mg/dL (ref 70–179)
Glucose, 2 hour: 85 mg/dL (ref 70–152)
Glucose, Fasting: 87 mg/dL (ref 70–91)

## 2023-06-29 LAB — HIV ANTIBODY (ROUTINE TESTING W REFLEX): HIV Screen 4th Generation wRfx: NONREACTIVE

## 2023-06-29 MED ORDER — ACCRUFER 30 MG PO CAPS: 1.0000 | ORAL_CAPSULE | Freq: Two times a day (BID) | ORAL | 3 refills | Status: DC

## 2023-07-04 ENCOUNTER — Encounter: Payer: Self-pay | Admitting: Licensed Clinical Social Worker

## 2023-07-09 ENCOUNTER — Ambulatory Visit: Payer: Self-pay | Admitting: Licensed Clinical Social Worker

## 2023-07-09 DIAGNOSIS — Z91199 Patient's noncompliance with other medical treatment and regimen due to unspecified reason: Secondary | ICD-10-CM

## 2023-07-09 NOTE — BH Specialist Note (Signed)
 Patient no showed for today's appointment. Virtual link was sent and a phone call was provided. [facility] left a VM.

## 2023-07-12 ENCOUNTER — Encounter: Admitting: Obstetrics

## 2023-07-15 ENCOUNTER — Ambulatory Visit: Attending: Obstetrics and Gynecology | Admitting: Maternal & Fetal Medicine

## 2023-07-15 ENCOUNTER — Ambulatory Visit

## 2023-07-15 ENCOUNTER — Other Ambulatory Visit: Payer: Self-pay | Admitting: *Deleted

## 2023-07-15 DIAGNOSIS — O3110X0 Continuing pregnancy after spontaneous abortion of one fetus or more, unspecified trimester, not applicable or unspecified: Secondary | ICD-10-CM

## 2023-07-15 DIAGNOSIS — O0990 Supervision of high risk pregnancy, unspecified, unspecified trimester: Secondary | ICD-10-CM

## 2023-07-15 DIAGNOSIS — O3663X0 Maternal care for excessive fetal growth, third trimester, not applicable or unspecified: Secondary | ICD-10-CM

## 2023-07-15 DIAGNOSIS — E669 Obesity, unspecified: Secondary | ICD-10-CM | POA: Diagnosis not present

## 2023-07-15 DIAGNOSIS — O364XX0 Maternal care for intrauterine death, not applicable or unspecified: Secondary | ICD-10-CM | POA: Insufficient documentation

## 2023-07-15 DIAGNOSIS — O99213 Obesity complicating pregnancy, third trimester: Secondary | ICD-10-CM

## 2023-07-15 DIAGNOSIS — O3113X0 Continuing pregnancy after spontaneous abortion of one fetus or more, third trimester, not applicable or unspecified: Secondary | ICD-10-CM | POA: Insufficient documentation

## 2023-07-15 DIAGNOSIS — O99212 Obesity complicating pregnancy, second trimester: Secondary | ICD-10-CM

## 2023-07-15 DIAGNOSIS — O3660X0 Maternal care for excessive fetal growth, unspecified trimester, not applicable or unspecified: Secondary | ICD-10-CM | POA: Diagnosis not present

## 2023-07-15 DIAGNOSIS — Z3A30 30 weeks gestation of pregnancy: Secondary | ICD-10-CM

## 2023-07-15 DIAGNOSIS — O30043 Twin pregnancy, dichorionic/diamniotic, third trimester: Secondary | ICD-10-CM | POA: Insufficient documentation

## 2023-07-15 NOTE — Progress Notes (Addendum)
 Patient information  Patient Name: Tina Keller  Patient MRN:   [id number]  Referring practice: MFM Referring Provider: [facility]  Problem List   Patient Active Problem List   Diagnosis Date Noted   Vanishing twin syndrome 04/04/2023   Twin pregnancy with single intrauterine death 2023-03-28   Supervision of high risk pregnancy, antepartum 02/06/2023   Vitamin D  deficiency 02/06/2023   MDD (major depressive disorder) 12/04/2022   Obesity in pregnancy, antepartum, third trimester 01/16/2021    Maternal Fetal medicine Consult  Tina Keller is a 28 y.o. G3P2002 at [redacted]w[redacted]d here for ultrasound and consultation. Tina Keller is doing well today with no acute concerns. Today we focused on the following:   Large for gestational age The estimated fetal weight on today's ultrasound is consistent with large for gestational age (LGA). The patient and her husband are both large people and this is likely constitutional, however, it may represent an abnormally large fetus since her previous children were all <8lbs. I discussed the diagnosis, management, and clinical significance of an LGA fetus. Risk factors for LGA include, but are not limited to, constitutional factors, pre-existing or gestational diabetes, maternal pre-pregnancy obesity, excessive gestational weight gain, abnormal fasting or postprandial blood glucose levels, dyslipidemia, a history of prior macrosomic newborns (defined as birth weight over 4000 g), and post-term pregnancy. Racial and ethnic factors may also contribute. Diagnosis is made via prenatal ultrasound; however, the prediction of neonatal weight remains imprecise, with an estimated variance of up to 20% between the expected and actual birth weight. Notably, ultrasound accuracy declines as fetal weight exceeds 4000 g, making precise estimation increasingly difficult. LGA serves as a surrogate marker for fetal macrosomia, which is defined by an absolute birth weight  of over 4000 g, regardless of gestational age. The estimated fetal weight at the time of delivery may influence the mode of delivery. In the absence of gestational diabetes, cesarean delivery should be considered if the estimated fetal weight exceeds 5000 g to reduce the risk of higher-order perineal lacerations, which may lead to urinary or fecal incontinence, pelvic organ prolapse, and shoulder dystocia--with or without permanent fetal neurologic injury or, in rare cases, fetal death.  Sonographic findings Single intrauterine pregnancy at 30w 5d.  Fetal cardiac activity:  Observed and appears normal. Presentation: Cephalic. Interval fetal anatomy appears normal. Fetal biometry shows the estimated fetal weight at the 95 percentile. Amniotic fluid volume: Within normal limits. MVP: 6.17 cm. Placenta: Anterior.  There are limitations of prenatal ultrasound such as the inability to detect certain abnormalities due to poor visualization. Various factors such as fetal position, gestational age and maternal body habitus may increase the difficulty in visualizing the fetal anatomy.    Recommendations -Serial growth ultrasounds every 4-6 weeks until delivery -Antenatal testing to start around 34 weeks due to elevated BMI -Delivery around [redacted] weeks gestation pending clinical course  Review of Systems: A review of systems was performed and was negative except per HPI   Vitals and Physical Exam    07/15/2023    1:20 PM 06/28/2023   10:37 AM 05/31/2023    8:42 AM  Vitals with BMI  Weight  281 lbs 13 oz 287 lbs  Systolic 139 126 086  Diastolic 64 83 70  Pulse 73 82 76    Sitting comfortably on the sonogram table Nonlabored breathing Normal rate and rhythm Abdomen is nontender  Past pregnancies OB History  Gravida Para Term Preterm AB Living  3 2 2  2  SAB IAB Ectopic Multiple Live Births     0 2    # Outcome Date GA Lbr Len/2nd Weight Sex Type Anes PTL Lv  3 Current           2 Term  03/20/21 [redacted]w[redacted]d 02:53 / 00:03 6 lb 11.8 oz (3.055 kg) F Vag-Spont EPI  LIV  1 Term 12/23/15 [redacted]w[redacted]d 12:16 / 00:25 7 lb 1.2 oz (3.21 kg) M Vag-Spont EPI  LIV     I spent 20 minutes reviewing the patients chart, including labs and images as well as counseling the patient about her medical conditions. Greater than 50% of the time was spent in direct face-to-face patient counseling.  Penney Bowling  MFM, [place]   07/15/2023  2:11 PM

## 2023-07-17 ENCOUNTER — Encounter: Payer: Self-pay | Admitting: Obstetrics and Gynecology

## 2023-07-17 ENCOUNTER — Ambulatory Visit: Admitting: Obstetrics and Gynecology

## 2023-07-17 VITALS — BP 119/68 | HR 80 | Wt 277.4 lb

## 2023-07-17 DIAGNOSIS — O3110X0 Continuing pregnancy after spontaneous abortion of one fetus or more, unspecified trimester, not applicable or unspecified: Secondary | ICD-10-CM

## 2023-07-17 DIAGNOSIS — O3660X0 Maternal care for excessive fetal growth, unspecified trimester, not applicable or unspecified: Secondary | ICD-10-CM

## 2023-07-17 DIAGNOSIS — Z3A31 31 weeks gestation of pregnancy: Secondary | ICD-10-CM

## 2023-07-17 DIAGNOSIS — O0990 Supervision of high risk pregnancy, unspecified, unspecified trimester: Secondary | ICD-10-CM

## 2023-07-17 MED ORDER — HYDROCORTISONE (PERIANAL) 2.5 % EX CREA: TOPICAL_CREAM | Freq: Two times a day (BID) | CUTANEOUS | 0 refills | Status: DC

## 2023-07-17 NOTE — Progress Notes (Signed)
   PRENATAL VISIT NOTE  Subjective:  Tina Keller is a 28 y.o. G3P2002 at [redacted]w[redacted]d being seen today for ongoing prenatal care.  She is currently monitored for the following issues for this low-risk pregnancy and has Obesity in pregnancy, antepartum, third trimester; MDD (major depressive disorder); Supervision of high risk pregnancy, antepartum; Vitamin D  deficiency; Twin pregnancy with single intrauterine death; Vanishing twin syndrome; and LGA (large for gestational age) fetus affecting mother, antepartum on their problem list.  Patient reports desiring waterbirth .  Contractions: Not present. Vag. Bleeding: None.  Movement: Present. Denies leaking of fluid.   The following portions of the patient's history were reviewed and updated as appropriate: allergies, current medications, past family history, past medical history, past social history, past surgical history and problem list.   Objective:    Vitals:   07/17/23 1407  BP: 119/68  Pulse: 80  Weight: 277 lb 6.4 oz (125.8 kg)    Fetal Status:  Fetal Heart Rate (bpm): 145   Movement: Present    General: Alert, oriented and cooperative. Patient is in no acute distress.  Skin: Skin is warm and dry. No rash noted.   Cardiovascular: Normal heart rate noted  Respiratory: Normal respiratory effort, no problems with respiration noted  Abdomen: Soft, gravid, appropriate for gestational age.  Pain/Pressure: Absent     Pelvic: Cervical exam deferred        Extremities: Normal range of motion.  Edema: None  Mental Status: Normal mood and affect. Normal behavior. Normal judgment and thought content.   Assessment and Plan:  Pregnancy: G3P2002 at [redacted]w[redacted]d 1. Supervision of high risk pregnancy, antepartum (Primary) BP and FHR normal Doing well, feeling regular movement    2. [redacted] weeks gestation of pregnancy Desires waterbirth, discussed meeting with provider, waterbirth class, information sent Will schedule with midwife Tdap next visit  3.  Vanishing twin syndrome   4. Excessive fetal growth affecting pregnancy, antepartum, single or unspecified fetus 6/9 EFW 95%, afi normal , serial growths and antenatal testing starting    Preterm labor symptoms and general obstetric precautions including but not limited to vaginal bleeding, contractions, leaking of fluid and fetal movement were reviewed in detail with the patient. Please refer to After Visit Summary for other counseling recommendations.   Return in about 2 weeks (around 07/31/2023), or schedule with midwife, for OB VISIT (MD or APP).  Future Appointments  Date Time Provider Department Center  07/31/2023  2:30 PM Zelma Hidden, FNP [facility] None  08/13/2023  1:45 PM [facility] PROVIDER 1 [facility]  08/13/2023  2:15 PM [facility] US4 [facility]  08/20/2023  1:00 PM [facility] PROVIDER 1 [facility]  08/20/2023  1:15 PM [facility] NST [facility]  08/27/2023  1:00 PM [facility] PROVIDER 1 [facility]  08/27/2023  1:15 PM [facility] NST [facility]  09/03/2023  1:00 PM [facility] PROVIDER 1 [facility]  09/03/2023  1:15 PM [facility] NST [facility]  09/10/2023  1:00 PM [facility] PROVIDER 1 [facility]  09/10/2023  1:30 PM [facility] US2 [facility]    Susi Eric, FNP

## 2023-07-17 NOTE — Progress Notes (Signed)
 Declines T-Dap at this time. Wants to discuss water birth.

## 2023-07-17 NOTE — Patient Instructions (Signed)
   Considering Waterbirth? Guide for patients at Center for [facility]) Why consider waterbirth? Gentle birth for babies  Less pain medicine used in labor  May allow for passive descent/less pushing  May reduce perineal tears  More mobility and instinctive maternal position changes  Increased maternal relaxation   Is waterbirth safe? What are the risks of infection, drowning or other complications? Infection:  Very low risk (3.7 % for tub vs 4.8% for bed)  7 in 8000 waterbirths with documented infection  Poorly cleaned equipment most common cause  Slightly lower group B strep transmission rate  Drowning  Maternal:  Very low risk  Related to seizures or fainting  Newborn:  Very low risk. No evidence of increased risk of respiratory problems in multiple large studies  Physiological protection from breathing under water  Avoid underwater birth if there are any fetal complications  Once baby's head is out of the water, keep it out.  Birth complication  Some reports of cord trauma, but risk decreased by bringing baby to surface gradually  No evidence of increased risk of shoulder dystocia. Mothers can usually change positions faster in water than in a bed, possibly aiding the maneuvers to free the shoulder.   There are 2 things you MUST do to have a waterbirth with [facility]: Attend a waterbirth class at [employer] [facility] at [facility]   3rd Wednesday of every month from 7-9 pm (virtual during COVID) [employer] at [email or web address] or [email or web address] or by calling [phone] Bring [place] the certificate from the class to your prenatal appointment or send via MyChart Meet with a midwife at 36 weeks* to see if you can still plan a waterbirth and to sign the consent.   *We also recommend that you schedule as many of your prenatal visits with a midwife as possible.    Helpful information: You may want to bring a bathing suit top to the hospital  to wear during labor but this is optional.  All other supplies are provided by the hospital. Please arrive at the hospital with signs of active labor, and do not wait at home until late in labor. It takes 45 min- 1 hour for fetal monitoring, and check in to your room to take place, plus transport and filling of the waterbirth tub.    Things that would prevent you from having a waterbirth: Premature, <37wks  Previous cesarean birth  Presence of thick meconium-stained fluid  Multiple gestation (Twins, triplets, etc.)  Uncontrolled diabetes or gestational diabetes requiring medication  Hypertension diagnosed in pregnancy or preexisting hypertension (gestational hypertension, preeclampsia, or chronic hypertension) Fetal growth restriction (your baby measures less than 10th percentile on ultrasound) Heavy vaginal bleeding  Non-reassuring fetal heart rate  Active infection (MRSA, etc.). Group B Strep is NOT a contraindication for waterbirth.  If your labor has to be induced and induction method requires continuous monitoring of the baby's heart rate  Other risks/issues identified by your obstetrical provider   Please remember that birth is unpredictable. Under certain unforeseeable circumstances your provider may advise against giving birth in the tub. These decisions will be made on a case-by-case basis and with the safety of you and your baby as our highest priority.    Updated 05/10/21

## 2023-07-31 ENCOUNTER — Encounter: Payer: Self-pay | Admitting: Obstetrics and Gynecology

## 2023-07-31 ENCOUNTER — Encounter: Payer: Self-pay | Admitting: Obstetrics

## 2023-07-31 ENCOUNTER — Ambulatory Visit: Admitting: Obstetrics and Gynecology

## 2023-07-31 VITALS — BP 122/70 | HR 80 | Wt 276.0 lb

## 2023-07-31 DIAGNOSIS — O3110X0 Continuing pregnancy after spontaneous abortion of one fetus or more, unspecified trimester, not applicable or unspecified: Secondary | ICD-10-CM | POA: Diagnosis not present

## 2023-07-31 DIAGNOSIS — Z3A33 33 weeks gestation of pregnancy: Secondary | ICD-10-CM

## 2023-07-31 DIAGNOSIS — O0990 Supervision of high risk pregnancy, unspecified, unspecified trimester: Secondary | ICD-10-CM | POA: Diagnosis not present

## 2023-07-31 DIAGNOSIS — Z23 Encounter for immunization: Secondary | ICD-10-CM | POA: Diagnosis not present

## 2023-07-31 DIAGNOSIS — O3660X0 Maternal care for excessive fetal growth, unspecified trimester, not applicable or unspecified: Secondary | ICD-10-CM | POA: Diagnosis not present

## 2023-07-31 NOTE — Progress Notes (Signed)
   PRENATAL VISIT NOTE  Subjective:  Tina Keller is a 28 y.o. G3P2002 at [redacted]w[redacted]d being seen today for ongoing prenatal care.  She is currently monitored for the following issues for this low-risk pregnancy and has Obesity in pregnancy, antepartum, third trimester; MDD (major depressive disorder); Supervision of high risk pregnancy, antepartum; Vitamin D  deficiency; Twin pregnancy with single intrauterine death; Vanishing twin syndrome; and LGA (large for gestational age) fetus affecting mother, antepartum on their problem list.  Patient reports no complaints.  Contractions: Not present. Vag. Bleeding: None.  Movement: Present. Denies leaking of fluid.   The following portions of the patient's history were reviewed and updated as appropriate: allergies, current medications, past family history, past medical history, past social history, past surgical history and problem list.   Objective:    Vitals:   07/31/23 1445  BP: 122/70  Pulse: 80  Weight: 276 lb (125.2 kg)    Fetal Status:      Movement: Present    General: Alert, oriented and cooperative. Patient is in no acute distress.  Skin: Skin is warm and dry. No rash noted.   Cardiovascular: Normal heart rate noted  Respiratory: Normal respiratory effort, no problems with respiration noted  Abdomen: Soft, gravid, appropriate for gestational age.  Pain/Pressure: Absent     Pelvic: Cervical exam deferred        Extremities: Normal range of motion.  Edema: None  Mental Status: Normal mood and affect. Normal behavior. Normal judgment and thought content.   Assessment and Plan:  Pregnancy: G3P2002 at [redacted]w[redacted]d 1. Supervision of high risk pregnancy, antepartum (Primary) BP and FHR normal Doing well, feeling regular movement    2. [redacted] weeks gestation of pregnancy Completed waterbirth clas, certificate in chart (media 07/31/23) Will schedule with midwife Tdap given today  3. Vanishing twin syndrome   4. Excessive fetal growth affecting  pregnancy, antepartum, single or unspecified fetus 6/9 EFW 95%, afi normal  Continue serial growth Antenatal testing 34 weeks Delivery around 39 weeks pending clinical course per mfm   Preterm labor symptoms and general obstetric precautions including but not limited to vaginal bleeding, contractions, leaking of fluid and fetal movement were reviewed in detail with the patient. Please refer to After Visit Summary for other counseling recommendations.   Return in about 2 weeks (around 08/14/2023), or schedule with midwife.  Future Appointments  Date Time Provider Department Center  08/13/2023  1:45 PM [facility] PROVIDER 1 [facility]  08/13/2023  2:15 PM [facility] US4 [facility]  08/20/2023 11:15 AM Leftwich-Kirby, Olam LABOR, CNM [facility] None  08/20/2023  1:00 PM [facility] PROVIDER 1 [facility]  08/20/2023  1:15 PM [facility] NST [facility]  08/27/2023  1:00 PM [facility] PROVIDER 1 [facility]  08/27/2023  1:15 PM [facility] NST [facility]  09/03/2023  1:00 PM [facility] PROVIDER 1 [facility]  09/03/2023  1:15 PM [facility] NST [facility]  09/10/2023  1:00 PM [facility] PROVIDER 1 [facility]  09/10/2023  1:30 PM [facility] US2 [facility]    Nidia Daring, FNP

## 2023-07-31 NOTE — Progress Notes (Signed)
 Pt presents for ROB visit. Completed water birth class.

## 2023-08-13 ENCOUNTER — Ambulatory Visit (HOSPITAL_BASED_OUTPATIENT_CLINIC_OR_DEPARTMENT_OTHER): Admitting: Obstetrics

## 2023-08-13 ENCOUNTER — Ambulatory Visit: Attending: Maternal & Fetal Medicine

## 2023-08-13 VITALS — BP 118/61 | HR 59

## 2023-08-13 DIAGNOSIS — E669 Obesity, unspecified: Secondary | ICD-10-CM

## 2023-08-13 DIAGNOSIS — O3663X0 Maternal care for excessive fetal growth, third trimester, not applicable or unspecified: Secondary | ICD-10-CM

## 2023-08-13 DIAGNOSIS — O3110X0 Continuing pregnancy after spontaneous abortion of one fetus or more, unspecified trimester, not applicable or unspecified: Secondary | ICD-10-CM | POA: Diagnosis not present

## 2023-08-13 DIAGNOSIS — Z3A34 34 weeks gestation of pregnancy: Secondary | ICD-10-CM | POA: Insufficient documentation

## 2023-08-13 DIAGNOSIS — O99213 Obesity complicating pregnancy, third trimester: Secondary | ICD-10-CM | POA: Diagnosis present

## 2023-08-13 NOTE — Progress Notes (Signed)
 MFM Consult Note  Tina Keller is currently at 34 weeks and 6 days.  She has been followed due to maternal obesity with a BMI of 45.  A large for gestational age fetus was noted on her prior exams.  She denies any problems since her last exam and has screened negative for gestational diabetes in her current pregnancy.  On today's exam, the overall EFW of 6 pounds 12 ounces measures at the 94th percentile for her gestational age.    There was normal amniotic fluid noted with a total AFI of 19.91 cm.    A BPP performed today was 8 out of 8.    Due to maternal obesity, we will continue to follow her with weekly fetal testing until delivery.    She will return in 1 week for an NST.    The patient stated that all of her questions were answered today.  A total of 10 minutes was spent counseling and coordinating the care for this patient.  Greater than 50% of the time was spent in direct face-to-face contact.

## 2023-08-20 ENCOUNTER — Ambulatory Visit (INDEPENDENT_AMBULATORY_CARE_PROVIDER_SITE_OTHER): Admitting: Advanced Practice Midwife

## 2023-08-20 ENCOUNTER — Encounter: Payer: Self-pay | Admitting: Advanced Practice Midwife

## 2023-08-20 ENCOUNTER — Ambulatory Visit: Attending: Maternal & Fetal Medicine | Admitting: *Deleted

## 2023-08-20 ENCOUNTER — Ambulatory Visit

## 2023-08-20 VITALS — BP 117/74 | HR 64 | Wt 277.0 lb

## 2023-08-20 VITALS — BP 115/70 | HR 70

## 2023-08-20 DIAGNOSIS — O3110X0 Continuing pregnancy after spontaneous abortion of one fetus or more, unspecified trimester, not applicable or unspecified: Secondary | ICD-10-CM

## 2023-08-20 DIAGNOSIS — O0990 Supervision of high risk pregnancy, unspecified, unspecified trimester: Secondary | ICD-10-CM | POA: Diagnosis not present

## 2023-08-20 DIAGNOSIS — D508 Other iron deficiency anemias: Secondary | ICD-10-CM | POA: Diagnosis not present

## 2023-08-20 DIAGNOSIS — O99213 Obesity complicating pregnancy, third trimester: Secondary | ICD-10-CM | POA: Diagnosis not present

## 2023-08-20 DIAGNOSIS — O99212 Obesity complicating pregnancy, second trimester: Secondary | ICD-10-CM

## 2023-08-20 DIAGNOSIS — O3663X0 Maternal care for excessive fetal growth, third trimester, not applicable or unspecified: Secondary | ICD-10-CM | POA: Diagnosis not present

## 2023-08-20 DIAGNOSIS — O10913 Unspecified pre-existing hypertension complicating pregnancy, third trimester: Secondary | ICD-10-CM | POA: Diagnosis present

## 2023-08-20 DIAGNOSIS — Z3A35 35 weeks gestation of pregnancy: Secondary | ICD-10-CM | POA: Diagnosis not present

## 2023-08-20 DIAGNOSIS — O358XX0 Maternal care for other (suspected) fetal abnormality and damage, not applicable or unspecified: Secondary | ICD-10-CM | POA: Insufficient documentation

## 2023-08-20 DIAGNOSIS — O10912 Unspecified pre-existing hypertension complicating pregnancy, second trimester: Secondary | ICD-10-CM

## 2023-08-20 NOTE — Procedures (Signed)
 Tina Keller 1995/08/17 [redacted]w[redacted]d  Fetus A Non-Stress Test Interpretation for 08/20/23-NST only  Indication: LGA, Obese,   Fetal Heart Rate A Mode: External Baseline Rate (A): 140 bpm Variability: Moderate Accelerations: 15 x 15 Decelerations: None Multiple birth?: No  Uterine Activity Mode: Toco Contraction Frequency (min): none Resting Tone Palpated: Relaxed  Interpretation (Fetal Testing) Nonstress Test Interpretation: Reactive Comments: Tracing reviewed byDr. William

## 2023-08-20 NOTE — Progress Notes (Signed)
 Pt presents for ROB visit. No concerns

## 2023-08-20 NOTE — Progress Notes (Signed)
   PRENATAL VISIT NOTE  Subjective:  Tina Keller is a 28 y.o. G3P2002 at [redacted]w[redacted]d being seen today for ongoing prenatal care.  She is currently monitored for the following issues for this low-risk pregnancy and has Obesity in pregnancy, antepartum, third trimester; MDD (major depressive disorder); Supervision of high risk pregnancy, antepartum; Vitamin D  deficiency; Twin pregnancy with single intrauterine death; Vanishing twin syndrome; and LGA (large for gestational age) fetus affecting mother, antepartum on their problem list.  Patient reports no complaints.  Contractions: Not present. Vag. Bleeding: None.  Movement: Present. Denies leaking of fluid.   The following portions of the patient's history were reviewed and updated as appropriate: allergies, current medications, past family history, past medical history, past social history, past surgical history and problem list.   Objective:    Vitals:   08/20/23 1146  BP: 117/74  Pulse: 64  Weight: 277 lb (125.6 kg)    Fetal Status:  Fetal Heart Rate (bpm): 149 Fundal Height: 36 cm Movement: Present    General: Alert, oriented and cooperative. Patient is in no acute distress.  Skin: Skin is warm and dry. No rash noted.   Cardiovascular: Normal heart rate noted  Respiratory: Normal respiratory effort, no problems with respiration noted  Abdomen: Soft, gravid, appropriate for gestational age.  Pain/Pressure: Absent     Pelvic: Cervical exam deferred        Extremities: Normal range of motion.  Edema: None  Mental Status: Normal mood and affect. Normal behavior. Normal judgment and thought content.   Assessment and Plan:  Pregnancy: G3P2002 at [redacted]w[redacted]d 1. Supervision of high risk pregnancy, antepartum (Primary) --due to vanishing twin in first trimester, no other pregnancy complications - Pt interested in waterbirth and has attended the class.  - Reviewed conditions in labor that will risk her out of water immersion including thick  meconium or blood stained amniotic fluid, non-reassuring fetal status on monitor, excessive bleeding, hypertension, dizziness, use of IV meds, damaged equipment or staffing that does not allow for water immersion, etc.  - The attending midwife must be on the unit for water immersion to begin; pt understands this may delay the start of water immersion. - Reminded pt that signing consent in labor at the hospital also acknowledges they will exit the tub if the attending midwife requests. - Discussed other labor support options if waterbirth becomes unavailable, including position change, freedom of movement, use of birthing ball, and/or use of hydrotherapy in the shower (dependent upon medical condition/provider discretion).   --No barriers to waterbirth at this time  --GCC and GBS next visit  2. Vanishing twin syndrome --at 8 weeks  3. Iron deficiency anemia secondary to inadequate dietary iron intake --On oral iron  4. [redacted] weeks gestation of pregnancy   Preterm labor symptoms and general obstetric precautions including but not limited to vaginal bleeding, contractions, leaking of fluid and fetal movement were reviewed in detail with the patient. Please refer to After Visit Summary for other counseling recommendations.   Return in about 1 week (around 08/27/2023) for Midwife preferred.  Future Appointments  Date Time Provider Department Center  08/27/2023  1:00 PM [facility] PROVIDER 1 [facility]  08/27/2023  1:15 PM [facility] NST [facility]  09/03/2023  1:00 PM [facility] PROVIDER 1 [facility]  09/03/2023  1:15 PM [facility] NST [facility]  09/10/2023  1:00 PM [facility] PROVIDER 1 [facility]  09/10/2023  1:30 PM [facility] US2 [facility]    Olam Boards, CNM

## 2023-08-27 ENCOUNTER — Ambulatory Visit: Admitting: Obstetrics

## 2023-08-27 ENCOUNTER — Ambulatory Visit: Attending: Maternal & Fetal Medicine

## 2023-08-27 VITALS — BP 118/54

## 2023-08-27 DIAGNOSIS — O99213 Obesity complicating pregnancy, third trimester: Secondary | ICD-10-CM | POA: Diagnosis not present

## 2023-08-27 DIAGNOSIS — Z3A36 36 weeks gestation of pregnancy: Secondary | ICD-10-CM

## 2023-08-27 DIAGNOSIS — O3110X0 Continuing pregnancy after spontaneous abortion of one fetus or more, unspecified trimester, not applicable or unspecified: Secondary | ICD-10-CM

## 2023-08-27 DIAGNOSIS — O3660X0 Maternal care for excessive fetal growth, unspecified trimester, not applicable or unspecified: Secondary | ICD-10-CM

## 2023-08-27 DIAGNOSIS — O3122X0 Continuing pregnancy after intrauterine death of one fetus or more, second trimester, not applicable or unspecified: Secondary | ICD-10-CM

## 2023-08-27 NOTE — Procedures (Signed)
 Tina Keller 1995-08-19 [redacted]w[redacted]d  Fetus A Non-Stress Test Interpretation for 08/27/23  Indication: LGA and Vanishing twin syndrome  Fetal Heart Rate A Mode: External Baseline Rate (A): 140 bpm Variability: Moderate Accelerations: 15 x 15 Decelerations: None Multiple birth?: No  Uterine Activity Mode: Toco, Palpation Contraction Frequency (min): none noted  Interpretation (Fetal Testing) Nonstress Test Interpretation: Reactive Comments: Reviewed with Dr. Ileana

## 2023-08-30 ENCOUNTER — Inpatient Hospital Stay (HOSPITAL_COMMUNITY)
Admission: AD | Admit: 2023-08-30 | Discharge: 2023-08-30 | Discharge status: Against Medical Advice | Attending: Obstetrics & Gynecology | Admitting: Obstetrics & Gynecology

## 2023-09-02 ENCOUNTER — Encounter: Admitting: Obstetrics & Gynecology

## 2023-09-03 ENCOUNTER — Ambulatory Visit: Admitting: Maternal & Fetal Medicine

## 2023-09-03 ENCOUNTER — Ambulatory Visit: Attending: Maternal & Fetal Medicine | Admitting: *Deleted

## 2023-09-03 VITALS — BP 128/66 | HR 67

## 2023-09-03 DIAGNOSIS — Z3A37 37 weeks gestation of pregnancy: Secondary | ICD-10-CM

## 2023-09-03 DIAGNOSIS — O3660X0 Maternal care for excessive fetal growth, unspecified trimester, not applicable or unspecified: Secondary | ICD-10-CM

## 2023-09-03 DIAGNOSIS — O3110X0 Continuing pregnancy after spontaneous abortion of one fetus or more, unspecified trimester, not applicable or unspecified: Secondary | ICD-10-CM

## 2023-09-03 DIAGNOSIS — O99213 Obesity complicating pregnancy, third trimester: Secondary | ICD-10-CM

## 2023-09-03 NOTE — Procedures (Signed)
 Tina Keller 1995/02/16 [redacted]w[redacted]d  Fetus A Non-Stress Test Interpretation for 09/03/23 (NST only)  Indication: Obesity, LGA  Fetal Heart Rate A Mode: External Baseline Rate (A): 135 bpm Variability: Moderate Accelerations: 15 x 15 Decelerations: None Multiple birth?: No  Uterine Activity Mode: Palpation, Toco Contraction Frequency (min): 1 UC Contraction Quality: Mild Resting Tone Palpated: Relaxed Resting Time: Adequate  Interpretation (Fetal Testing) Nonstress Test Interpretation: Reactive Comments: Dr. William reviewed tracing.

## 2023-09-05 ENCOUNTER — Other Ambulatory Visit (HOSPITAL_COMMUNITY)
Admission: RE | Admit: 2023-09-05 | Discharge: 2023-09-05 | Disposition: A | Discharge status: Home / Self Care | Source: Ambulatory Visit | Attending: Obstetrics and Gynecology | Admitting: Obstetrics and Gynecology

## 2023-09-05 ENCOUNTER — Ambulatory Visit (INDEPENDENT_AMBULATORY_CARE_PROVIDER_SITE_OTHER): Admitting: Obstetrics and Gynecology

## 2023-09-05 VITALS — BP 115/75 | HR 63 | Wt 285.0 lb

## 2023-09-05 DIAGNOSIS — O3660X0 Maternal care for excessive fetal growth, unspecified trimester, not applicable or unspecified: Secondary | ICD-10-CM

## 2023-09-05 DIAGNOSIS — O0990 Supervision of high risk pregnancy, unspecified, unspecified trimester: Secondary | ICD-10-CM | POA: Diagnosis not present

## 2023-09-05 DIAGNOSIS — Z3A38 38 weeks gestation of pregnancy: Secondary | ICD-10-CM

## 2023-09-05 DIAGNOSIS — O99213 Obesity complicating pregnancy, third trimester: Secondary | ICD-10-CM | POA: Diagnosis not present

## 2023-09-05 DIAGNOSIS — Z6841 Body Mass Index (BMI) 40.0 and over, adult: Secondary | ICD-10-CM

## 2023-09-05 NOTE — Progress Notes (Signed)
 ROB/GBS. Reports no concerns

## 2023-09-05 NOTE — Progress Notes (Signed)
   PRENATAL VISIT NOTE  Subjective:  Tina Keller is a 28 y.o. G3P2002 at [redacted]w[redacted]d being seen today for ongoing prenatal care.  She is currently monitored for the following issues for this high-risk pregnancy and has Obesity in pregnancy, antepartum, third trimester; MDD (major depressive disorder); Supervision of high risk pregnancy, antepartum; Vitamin D  deficiency; Twin pregnancy with single intrauterine death; Vanishing twin syndrome; LGA (large for gestational age) fetus affecting mother, antepartum; and BMI 40.0-44.9, adult (HCC) on their problem list.  Patient doing well with no acute concerns today. She reports backache.  Contractions: Not present. Vag. Bleeding: None.  Movement: Present. Denies leaking of fluid.   The following portions of the patient's history were reviewed and updated as appropriate: allergies, current medications, past family history, past medical history, past social history, past surgical history and problem list. Problem list updated.  Objective:   Vitals:   09/05/23 1622  BP: 115/75  Pulse: 63  Weight: 285 lb (129.3 kg)    Fetal Status: Fetal Heart Rate (bpm): 150 Fundal Height: 39 cm Movement: Present     General:  Alert, oriented and cooperative. Patient is in no acute distress.  Skin: Skin is warm and dry. No rash noted.   Cardiovascular: Normal heart rate noted  Respiratory: Normal respiratory effort, no problems with respiration noted  Abdomen: Soft, gravid, appropriate for gestational age.  Pain/Pressure: Present     Pelvic: Cervical exam performed Dilation: Fingertip Effacement (%): Thick Station: -3  Extremities: Normal range of motion.  Edema: None  Mental Status:  Normal mood and affect. Normal behavior. Normal judgment and thought content.   Assessment and Plan:  Pregnancy: G3P2002 at [redacted]w[redacted]d  1. [redacted] weeks gestation of pregnancy (Primary)   2. Supervision of high risk pregnancy, antepartum Continue routine prenatal care Discuss IOL at next  visit  - Culture, beta strep (group b only) - Cervicovaginal ancillary only( [place])  3. Obesity in pregnancy, antepartum, third trimester   4. Excessive fetal growth affecting pregnancy, antepartum, single or unspecified fetus Pt has growth scan in about a week  5. BMI 40.0-44.9, adult [facility])   Term labor symptoms and general obstetric precautions including but not limited to vaginal bleeding, contractions, leaking of fluid and fetal movement were reviewed in detail with the patient.  Please refer to After Visit Summary for other counseling recommendations.   Return in about 1 week (around 09/12/2023) for [facility], in person.   Jerilynn Buddle, MD Faculty Attending Center for [facility]

## 2023-09-06 NOTE — Progress Notes (Signed)
 Reactive NST

## 2023-09-08 LAB — CULTURE, BETA STREP (GROUP B ONLY): Strep Gp B Culture: POSITIVE — AB

## 2023-09-09 ENCOUNTER — Ambulatory Visit: Payer: Self-pay | Admitting: Obstetrics and Gynecology

## 2023-09-09 DIAGNOSIS — O99820 Streptococcus B carrier state complicating pregnancy: Secondary | ICD-10-CM | POA: Insufficient documentation

## 2023-09-09 LAB — CERVICOVAGINAL ANCILLARY ONLY
Chlamydia: NEGATIVE
Comment: NEGATIVE
Comment: NORMAL
Neisseria Gonorrhea: NEGATIVE

## 2023-09-10 ENCOUNTER — Ambulatory Visit: Attending: Maternal & Fetal Medicine

## 2023-09-10 ENCOUNTER — Ambulatory Visit (HOSPITAL_BASED_OUTPATIENT_CLINIC_OR_DEPARTMENT_OTHER): Admitting: Maternal & Fetal Medicine

## 2023-09-10 VITALS — BP 116/66

## 2023-09-10 DIAGNOSIS — Z3A38 38 weeks gestation of pregnancy: Secondary | ICD-10-CM | POA: Diagnosis present

## 2023-09-10 DIAGNOSIS — O3110X0 Continuing pregnancy after spontaneous abortion of one fetus or more, unspecified trimester, not applicable or unspecified: Secondary | ICD-10-CM

## 2023-09-10 DIAGNOSIS — O3663X0 Maternal care for excessive fetal growth, third trimester, not applicable or unspecified: Secondary | ICD-10-CM | POA: Diagnosis not present

## 2023-09-10 DIAGNOSIS — E669 Obesity, unspecified: Secondary | ICD-10-CM | POA: Diagnosis not present

## 2023-09-10 DIAGNOSIS — O3113X0 Continuing pregnancy after spontaneous abortion of one fetus or more, third trimester, not applicable or unspecified: Secondary | ICD-10-CM | POA: Diagnosis not present

## 2023-09-10 DIAGNOSIS — O99213 Obesity complicating pregnancy, third trimester: Secondary | ICD-10-CM | POA: Insufficient documentation

## 2023-09-10 NOTE — Progress Notes (Signed)
 After review, MFM consult with provider is not indicated for today  William Glenn, DO 09/10/2023 2:05 PM  [facility]

## 2023-09-11 NOTE — Addendum Note (Signed)
 Addended by: WILLIAM DELORA SAILOR on: 09/11/2023 09:07 AM   Modules accepted: Level of Service

## 2023-09-12 ENCOUNTER — Other Ambulatory Visit: Payer: Self-pay | Admitting: Obstetrics & Gynecology

## 2023-09-12 ENCOUNTER — Ambulatory Visit (INDEPENDENT_AMBULATORY_CARE_PROVIDER_SITE_OTHER): Admitting: Obstetrics & Gynecology

## 2023-09-12 VITALS — BP 112/70 | HR 65 | Wt 284.6 lb

## 2023-09-12 DIAGNOSIS — O99820 Streptococcus B carrier state complicating pregnancy: Secondary | ICD-10-CM

## 2023-09-12 DIAGNOSIS — O3122X0 Continuing pregnancy after intrauterine death of one fetus or more, second trimester, not applicable or unspecified: Secondary | ICD-10-CM

## 2023-09-12 DIAGNOSIS — O0990 Supervision of high risk pregnancy, unspecified, unspecified trimester: Secondary | ICD-10-CM

## 2023-09-12 DIAGNOSIS — O3110X0 Continuing pregnancy after spontaneous abortion of one fetus or more, unspecified trimester, not applicable or unspecified: Secondary | ICD-10-CM | POA: Diagnosis not present

## 2023-09-12 DIAGNOSIS — O3660X0 Maternal care for excessive fetal growth, unspecified trimester, not applicable or unspecified: Secondary | ICD-10-CM | POA: Diagnosis not present

## 2023-09-12 NOTE — Progress Notes (Signed)
 Pt presents for ROB. No questions or concerns.

## 2023-09-12 NOTE — Progress Notes (Signed)
   PRENATAL VISIT NOTE  Subjective:  Tina Keller is a 28 y.o. G3P2002 at [redacted]w[redacted]d being seen today for ongoing prenatal care.  She is currently monitored for the following issues for this high-risk pregnancy and has Obesity in pregnancy, antepartum, third trimester; MDD (major depressive disorder); Supervision of high risk pregnancy, antepartum; Vitamin D  deficiency; Twin pregnancy with single intrauterine death; Vanishing twin syndrome; LGA (large for gestational age) fetus affecting mother, antepartum; BMI 40.0-44.9, adult (HCC); and Group B Streptococcus carrier, antepartum on their problem list.  Patient reports occasional contractions.  Contractions: Irritability. Vag. Bleeding: None.  Movement: Present. Denies leaking of fluid.   The following portions of the patient's history were reviewed and updated as appropriate: allergies, current medications, past family history, past medical history, past social history, past surgical history and problem list.   Objective:    Vitals:   09/12/23 1442  BP: 112/70  Pulse: 65  Weight: 284 lb 9.6 oz (129.1 kg)    Fetal Status:  Fetal Heart Rate (bpm): 142   Movement: Present    General: Alert, oriented and cooperative. Patient is in no acute distress.  Skin: Skin is warm and dry. No rash noted.   Cardiovascular: Normal heart rate noted  Respiratory: Normal respiratory effort, no problems with respiration noted  Abdomen: Soft, gravid, appropriate for gestational age.  Pain/Pressure: Absent     Pelvic: Cervical exam deferred        Extremities: Normal range of motion.  Edema: None  Mental Status: Normal mood and affect. Normal behavior. Normal judgment and thought content.   Assessment and Plan:  Pregnancy: G3P2002 at [redacted]w[redacted]d 1. Supervision of high risk pregnancy, antepartum (Primary) IOL scheduled  2. Vanishing twin syndrome   3. Excessive fetal growth affecting pregnancy, antepartum, single or unspecified fetus   4. Group B  Streptococcus carrier, antepartum   5. Twin pregnancy with single intrauterine death in second trimester, single or unspecified fetus   Term labor symptoms and general obstetric precautions including but not limited to vaginal bleeding, contractions, leaking of fluid and fetal movement were reviewed in detail with the patient. Please refer to After Visit Summary for other counseling recommendations.   Return if symptoms worsen or fail to improve.  No future appointments.  Lynwood Solomons, MD

## 2023-09-13 ENCOUNTER — Encounter (HOSPITAL_COMMUNITY): Payer: Self-pay | Admitting: *Deleted

## 2023-09-13 ENCOUNTER — Telehealth (HOSPITAL_COMMUNITY): Payer: Self-pay | Admitting: *Deleted

## 2023-09-13 NOTE — Telephone Encounter (Signed)
 Preadmission screen

## 2023-09-14 ENCOUNTER — Inpatient Hospital Stay (HOSPITAL_COMMUNITY)

## 2023-09-15 ENCOUNTER — Inpatient Hospital Stay (HOSPITAL_COMMUNITY)
Admission: AD | Admit: 2023-09-15 | Discharge: 2023-09-16 | DRG: 807 | Disposition: A | Discharge status: Home / Self Care | Payer: Self-pay | Attending: Obstetrics & Gynecology | Admitting: Obstetrics & Gynecology

## 2023-09-15 ENCOUNTER — Encounter (HOSPITAL_COMMUNITY): Payer: Self-pay | Admitting: Obstetrics & Gynecology

## 2023-09-15 ENCOUNTER — Other Ambulatory Visit: Payer: Self-pay

## 2023-09-15 DIAGNOSIS — Z7982 Long term (current) use of aspirin: Secondary | ICD-10-CM | POA: Diagnosis not present

## 2023-09-15 DIAGNOSIS — O3660X0 Maternal care for excessive fetal growth, unspecified trimester, not applicable or unspecified: Secondary | ICD-10-CM | POA: Diagnosis present

## 2023-09-15 DIAGNOSIS — O99820 Streptococcus B carrier state complicating pregnancy: Secondary | ICD-10-CM

## 2023-09-15 DIAGNOSIS — O3663X0 Maternal care for excessive fetal growth, third trimester, not applicable or unspecified: Principal | ICD-10-CM | POA: Diagnosis present

## 2023-09-15 DIAGNOSIS — R030 Elevated blood-pressure reading, without diagnosis of hypertension: Secondary | ICD-10-CM | POA: Diagnosis not present

## 2023-09-15 DIAGNOSIS — Z8249 Family history of ischemic heart disease and other diseases of the circulatory system: Secondary | ICD-10-CM | POA: Diagnosis not present

## 2023-09-15 DIAGNOSIS — O3120X0 Continuing pregnancy after intrauterine death of one fetus or more, unspecified trimester, not applicable or unspecified: Secondary | ICD-10-CM | POA: Diagnosis present

## 2023-09-15 DIAGNOSIS — O3110X0 Continuing pregnancy after spontaneous abortion of one fetus or more, unspecified trimester, not applicable or unspecified: Secondary | ICD-10-CM | POA: Diagnosis present

## 2023-09-15 DIAGNOSIS — O99824 Streptococcus B carrier state complicating childbirth: Secondary | ICD-10-CM | POA: Diagnosis present

## 2023-09-15 DIAGNOSIS — Z3A39 39 weeks gestation of pregnancy: Secondary | ICD-10-CM

## 2023-09-15 DIAGNOSIS — O0990 Supervision of high risk pregnancy, unspecified, unspecified trimester: Secondary | ICD-10-CM

## 2023-09-15 DIAGNOSIS — Z87891 Personal history of nicotine dependence: Secondary | ICD-10-CM

## 2023-09-15 DIAGNOSIS — Z6841 Body Mass Index (BMI) 40.0 and over, adult: Secondary | ICD-10-CM

## 2023-09-15 LAB — CBC
HCT: 34.7 % — ABNORMAL LOW (ref 36.0–46.0)
HCT: 35.6 % — ABNORMAL LOW (ref 36.0–46.0)
Hemoglobin: 11.5 g/dL — ABNORMAL LOW (ref 12.0–15.0)
Hemoglobin: 11.5 g/dL — ABNORMAL LOW (ref 12.0–15.0)
MCH: 28.5 pg (ref 26.0–34.0)
MCH: 28 pg (ref 26.0–34.0)
MCHC: 33.1 g/dL (ref 30.0–36.0)
MCHC: 32.3 g/dL (ref 30.0–36.0)
MCV: 85.9 fL (ref 80.0–100.0)
MCV: 86.6 fL (ref 80.0–100.0)
Platelets: 151 K/uL (ref 150–400)
Platelets: 137 K/uL — ABNORMAL LOW (ref 150–400)
RBC: 4.04 MIL/uL (ref 3.87–5.11)
RBC: 4.11 MIL/uL (ref 3.87–5.11)
RDW: 14.1 % (ref 11.5–15.5)
RDW: 14 % (ref 11.5–15.5)
WBC: 7 K/uL (ref 4.0–10.5)
WBC: 7 K/uL (ref 4.0–10.5)
nRBC: 0 % (ref 0.0–0.2)
nRBC: 0 % (ref 0.0–0.2)

## 2023-09-15 LAB — COMPREHENSIVE METABOLIC PANEL WITH GFR
ALT: 13 U/L (ref 0–44)
AST: 19 U/L (ref 15–41)
Albumin: 2.6 g/dL — ABNORMAL LOW (ref 3.5–5.0)
Alkaline Phosphatase: 118 U/L (ref 38–126)
Anion gap: 12 (ref 5–15)
BUN: 7 mg/dL (ref 6–20)
CO2: 18 mmol/L — ABNORMAL LOW (ref 22–32)
Calcium: 8.9 mg/dL (ref 8.9–10.3)
Chloride: 107 mmol/L (ref 98–111)
Creatinine, Ser: 0.7 mg/dL (ref 0.44–1.00)
GFR, Estimated: >60 mL/min (ref >60)
Glucose, Bld: 86 mg/dL (ref 70–99)
Potassium: 3.7 mmol/L (ref 3.5–5.1)
Sodium: 137 mmol/L (ref 135–145)
Total Bilirubin: 0.6 mg/dL (ref 0.0–1.2)
Total Protein: 5.9 g/dL — ABNORMAL LOW (ref 6.5–8.1)

## 2023-09-15 LAB — RPR: RPR Ser Ql: NONREACTIVE

## 2023-09-15 LAB — TYPE AND SCREEN
ABO/RH(D): O POS
Antibody Screen: NEGATIVE

## 2023-09-15 MED ORDER — OXYCODONE-ACETAMINOPHEN 5-325 MG PO TABS: 2.0000 | ORAL_TABLET | ORAL | Status: DC | PRN

## 2023-09-15 MED ORDER — LABETALOL HCL 5 MG/ML IV SOLN
20.0000 mg | INTRAVENOUS | Status: DC | PRN
  Administered 2023-09-15: 20 mg via INTRAVENOUS
  Filled 2023-09-15: qty 4

## 2023-09-15 MED ORDER — DIPHENHYDRAMINE HCL 25 MG PO CAPS: 25.0000 mg | ORAL_CAPSULE | Freq: Four times a day (QID) | ORAL | Status: DC | PRN

## 2023-09-15 MED ORDER — IBUPROFEN 600 MG PO TABS
600.0000 mg | ORAL_TABLET | Freq: Four times a day (QID) | ORAL | Status: DC
  Administered 2023-09-15 – 2023-09-16 (×6): 600 mg via ORAL
  Filled 2023-09-15 (×4): qty 1

## 2023-09-15 MED ORDER — HYDRALAZINE HCL 20 MG/ML IJ SOLN: 10.0000 mg | INTRAMUSCULAR | Status: DC | PRN

## 2023-09-15 MED ORDER — TERBUTALINE SULFATE 1 MG/ML IJ SOLN: 0.2500 mg | Freq: Once | INTRAMUSCULAR | Status: DC | PRN

## 2023-09-15 MED ORDER — ACETAMINOPHEN 325 MG PO TABS
650.0000 mg | ORAL_TABLET | ORAL | Status: DC | PRN
  Administered 2023-09-15 – 2023-09-16 (×3): 650 mg via ORAL
  Filled 2023-09-15 (×2): qty 2

## 2023-09-15 MED ORDER — LABETALOL HCL 5 MG/ML IV SOLN: 40.0000 mg | INTRAVENOUS | Status: DC | PRN

## 2023-09-15 MED ORDER — MISOPROSTOL 25 MCG QUARTER TABLET
25.0000 ug | ORAL_TABLET | Freq: Once | ORAL | Status: AC
  Administered 2023-09-15: 25 ug via VAGINAL
  Filled 2023-09-15: qty 1

## 2023-09-15 MED ORDER — OXYTOCIN BOLUS FROM INFUSION
333.0000 mL | Freq: Once | INTRAVENOUS | Status: AC
  Administered 2023-09-15: 333 mL via INTRAVENOUS

## 2023-09-15 MED ORDER — TETANUS-DIPHTH-ACELL PERTUSSIS 5-2.5-18.5 LF-MCG/0.5 IM SUSY: 0.5000 mL | PREFILLED_SYRINGE | Freq: Once | INTRAMUSCULAR | Status: DC

## 2023-09-15 MED ORDER — FENTANYL CITRATE (PF) 100 MCG/2ML IJ SOLN
INTRAMUSCULAR | Status: AC
  Filled 2023-09-15: qty 2

## 2023-09-15 MED ORDER — WITCH HAZEL-GLYCERIN EX PADS
1.0000 | MEDICATED_PAD | CUTANEOUS | Status: DC | PRN
Start: 2023-09-15 — End: 2023-09-16

## 2023-09-15 MED ORDER — PRENATAL MULTIVITAMIN CH
1.0000 | ORAL_TABLET | Freq: Every day | ORAL | Status: DC
  Administered 2023-09-16 (×2): 1 via ORAL
  Filled 2023-09-15: qty 1

## 2023-09-15 MED ORDER — ACETAMINOPHEN 325 MG PO TABS: 650.0000 mg | ORAL_TABLET | ORAL | Status: DC | PRN

## 2023-09-15 MED ORDER — FENTANYL CITRATE (PF) 100 MCG/2ML IJ SOLN
100.0000 ug | Freq: Once | INTRAMUSCULAR | Status: AC
  Administered 2023-09-15: 100 ug via INTRAVENOUS

## 2023-09-15 MED ORDER — COCONUT OIL OIL: 1.0000 | TOPICAL_OIL | Status: DC | PRN

## 2023-09-15 MED ORDER — ONDANSETRON HCL 4 MG/2ML IJ SOLN: 4.0000 mg | INTRAMUSCULAR | Status: DC | PRN

## 2023-09-15 MED ORDER — LIDOCAINE HCL (PF) 1 % IJ SOLN: 30.0000 mL | INTRAMUSCULAR | Status: DC | PRN

## 2023-09-15 MED ORDER — SODIUM CHLORIDE 0.9 % IV SOLN
5.0000 10*6.[IU] | Freq: Once | INTRAVENOUS | Status: AC
  Administered 2023-09-15: 5 10*6.[IU] via INTRAVENOUS
  Filled 2023-09-15: qty 5

## 2023-09-15 MED ORDER — OXYTOCIN-SODIUM CHLORIDE 30-0.9 UT/500ML-% IV SOLN
2.5000 [IU]/h | INTRAVENOUS | Status: DC
  Administered 2023-09-15: 2.5 [IU]/h via INTRAVENOUS
  Filled 2023-09-15: qty 500

## 2023-09-15 MED ORDER — OXYCODONE-ACETAMINOPHEN 5-325 MG PO TABS: 1.0000 | ORAL_TABLET | ORAL | Status: DC | PRN

## 2023-09-15 MED ORDER — ONDANSETRON HCL 4 MG/2ML IJ SOLN
4.0000 mg | Freq: Four times a day (QID) | INTRAMUSCULAR | Status: DC | PRN
  Administered 2023-09-15: 4 mg via INTRAVENOUS
  Filled 2023-09-15: qty 2

## 2023-09-15 MED ORDER — SIMETHICONE 80 MG PO CHEW: 80.0000 mg | CHEWABLE_TABLET | ORAL | Status: DC | PRN

## 2023-09-15 MED ORDER — ONDANSETRON HCL 4 MG PO TABS: 4.0000 mg | ORAL_TABLET | ORAL | Status: DC | PRN

## 2023-09-15 MED ORDER — PENICILLIN G POT IN DEXTROSE 60000 UNIT/ML IV SOLN
3.0000 10*6.[IU] | INTRAVENOUS | Status: DC
  Administered 2023-09-15: 3 10*6.[IU] via INTRAVENOUS
  Filled 2023-09-15: qty 50

## 2023-09-15 MED ORDER — HYDRALAZINE HCL 20 MG/ML IJ SOLN: 5.0000 mg | INTRAMUSCULAR | Status: DC | PRN

## 2023-09-15 MED ORDER — MISOPROSTOL 50MCG HALF TABLET
50.0000 ug | ORAL_TABLET | Freq: Once | ORAL | Status: AC
  Administered 2023-09-15: 50 ug via ORAL
  Filled 2023-09-15: qty 1

## 2023-09-15 MED ORDER — OXYCODONE HCL 5 MG PO TABS
5.0000 mg | ORAL_TABLET | ORAL | Status: DC | PRN
  Administered 2023-09-16 (×2): 5 mg via ORAL
  Filled 2023-09-15: qty 1

## 2023-09-15 MED ORDER — MEASLES, MUMPS & RUBELLA VAC IJ SOLR: 0.5000 mL | Freq: Once | INTRAMUSCULAR | Status: DC

## 2023-09-15 MED ORDER — SENNOSIDES-DOCUSATE SODIUM 8.6-50 MG PO TABS
2.0000 | ORAL_TABLET | ORAL | Status: DC
  Administered 2023-09-15: 2 via ORAL
  Filled 2023-09-15: qty 2

## 2023-09-15 MED ORDER — SODIUM CHLORIDE 0.9 % IV SOLN
250.0000 mL | INTRAVENOUS | Status: DC | PRN
Start: 2023-09-15 — End: 2023-09-16

## 2023-09-15 MED ORDER — SOD CITRATE-CITRIC ACID 500-334 MG/5ML PO SOLN: 30.0000 mL | ORAL | Status: DC | PRN

## 2023-09-15 MED ORDER — SODIUM CHLORIDE 0.9% FLUSH: 3.0000 mL | Freq: Two times a day (BID) | INTRAVENOUS | Status: DC

## 2023-09-15 MED ORDER — ZOLPIDEM TARTRATE 5 MG PO TABS: 5.0000 mg | ORAL_TABLET | Freq: Every evening | ORAL | Status: DC | PRN

## 2023-09-15 MED ORDER — SODIUM CHLORIDE 0.9% FLUSH: 3.0000 mL | INTRAVENOUS | Status: DC | PRN

## 2023-09-15 MED ORDER — LACTATED RINGERS IV SOLN: INTRAVENOUS | Status: DC

## 2023-09-15 MED ORDER — BENZOCAINE-MENTHOL 20-0.5 % EX AERO: 1.0000 | INHALATION_SPRAY | CUTANEOUS | Status: DC | PRN

## 2023-09-15 MED ORDER — LACTATED RINGERS IV SOLN: 500.0000 mL | INTRAVENOUS | Status: DC | PRN

## 2023-09-15 MED ORDER — DIBUCAINE (PERIANAL) 1 % EX OINT: 1.0000 | TOPICAL_OINTMENT | CUTANEOUS | Status: DC | PRN

## 2023-09-15 NOTE — Progress Notes (Signed)
Pt walking in hall

## 2023-09-15 NOTE — Lactation Note (Signed)
 This note was copied from a baby's chart. Lactation Consultation Note  Patient Name: Tina Keller Date: 09/15/2023 Age:28 hours Reason for consult: Initial assessment;Term  P3, 39 wks, @ 3 hrs of life. Mom was exhausted- first feeding formula. Discussed belly size on first day, 5-10 ml or 5-10 mins normal meal size in first day. Mom desires help getting her breast pump- receptive to [facility] pump referral. Discussed getting hand pump from us  as well.  Discussed expectations @ breast- Day 1- sleepy/ feed every 3 hrs/ even 10 minutes is okay, Day 2 more awake/ feeding cues/longer feeds, and cluster feeding overnights brings milk in. Highlighted breast stimulation is tied directly to milk production. LC services and milk storage shared. Encouraged mom to call for assist anytime desired.  Maternal Data Does the patient have breastfeeding experience prior to this delivery?: Yes How long did the patient breastfeed?: 6 months and 3 months  Feeding Mother's Current Feeding Choice: Breast Milk and Formula Nipple Type: Slow - flow   Interventions Interventions: Education;LC Services brochure;CDC milk storage guidelines;Breast feeding basics reviewed  Discharge Pump: Referral sent for [employer] Pump (Talked about getting a hand pump from us  before discharge)  Consult Status Consult Status: Follow-up Date: 09/16/23 Follow-up type: In-patient    Michah Minton 09/15/2023, 6:42 PM

## 2023-09-15 NOTE — Discharge Summary (Signed)
 Postpartum Discharge Summary  Date of Service updated***     Patient Name: Tina Keller DOB: 1995-12-04 MRN: [id number]  Date of admission: 09/15/2023 Delivery date:09/15/2023 Delivering provider: LEVEQUE, ALYSSA Date of discharge: 09/15/2023  Admitting diagnosis: Fetal macrosomia during pregnancy in third trimester [O36.63X0] Intrauterine pregnancy: [redacted]w[redacted]d     Secondary diagnosis:  Principal Problem:   Fetal macrosomia during pregnancy in third trimester Active Problems:   Twin pregnancy with single intrauterine death   Vanishing twin syndrome   LGA (large for gestational age) fetus affecting mother, antepartum   BMI 40.0-44.9, adult (HCC)   Group B Streptococcus carrier, antepartum   NSVD (normal spontaneous vaginal delivery)  Additional problems: ***    Discharge diagnosis: Term Pregnancy Delivered                                              Post partum procedures:{Postpartum procedures:23558} Augmentation: AROM Complications: {OB Labor/Delivery Complications:20784}  Hospital course: Induction of Labor With Vaginal Delivery   28 y.o. yo G3P3003 at [redacted]w[redacted]d was admitted to the hospital 09/15/2023 for induction of labor.  Indication for induction: LGA.  Patient had an uncomplicated labor course. Membrane Rupture Time/Date: 12:33 PM,09/15/2023  Delivery Method:Vaginal, Spontaneous Operative Delivery:N/A Episiotomy: None Lacerations:  None Details of delivery can be found in separate delivery note.  Patient had a postpartum course complicated by***. Patient is discharged home 09/15/23.  Newborn Data: Birth date:09/15/2023 Birth time:3:01 PM Gender:Female Living status:Living Apgars:9 ,9  Weight:3900 g  Magnesium Sulfate received: {Mag received:30440022} BMZ received: No Rhophylac:N/A MMR:Yes T-DaP:Given prenatally Flu: No RSV Vaccine received: No Transfusion:{Transfusion received:30440034}  Immunizations received: Immunization History  Administered Date(s)  Administered   HPV Quadrivalent 11/26/2006, 11/26/2011, 02/13/2013   Hepatitis A 11/26/2006   Hepatitis B 11-29-95, 10/28/1995, 02/14/1996   Influenza,inj,Quad PF,6+ Mos 11/03/2019   MMR 05/05/2021   Meningococcal polysaccharide vaccine (MPSV4) 11/26/2006, 11/26/2011   PFIZER(Purple Top)SARS-COV-2 Vaccination 11/13/2019, 12/04/2019   PPD Test 05/01/2023   Td 11/01/2006   Tdap 10/20/2015, 01/19/2021, 07/31/2023    Physical exam  Vitals:   09/15/23 1157 09/15/23 1315 09/15/23 1358 09/15/23 1419  BP: (!) 121/59 (!) 126/94  (!) 143/67  Pulse: 66 (!) 109  74  Resp: 20 20  16   Temp: 97.7 F (36.5 C)  98.3 F (36.8 C)   TempSrc: Oral  Oral   Weight:      Height:       General: {Exam; general:21111117} Lochia: {Desc; appropriate/inappropriate:30686::appropriate} Uterine Fundus: {Desc; firm/soft:30687} Incision: {Exam; incision:21111123} DVT Evaluation: {Exam; dvt:2111122} Labs: Lab Results  Component Value Date   WBC 7.0 09/15/2023   HGB 11.5 (L) 09/15/2023   HCT 34.7 (L) 09/15/2023   MCV 85.9 09/15/2023   PLT 151 09/15/2023      Latest Ref Rng & Units 11/13/2019   11:42 AM  CMP  Glucose 65 - 99 mg/dL 93   BUN 6 - 20 mg/dL 13   Creatinine 9.42 - 1.00 mg/dL 9.25   Sodium 865 - 855 mmol/L 141   Potassium 3.5 - 5.2 mmol/L 4.0   Chloride 96 - 106 mmol/L 104   CO2 20 - 29 mmol/L 22   Calcium 8.7 - 10.2 mg/dL 9.7   Total Protein 6.0 - 8.5 g/dL 7.1   Total Bilirubin 0.0 - 1.2 mg/dL 0.3   Alkaline Phos 44 - 121 IU/L 44   AST 0 -  40 IU/L 15   ALT 0 - 32 IU/L 15    Edinburgh Score:    05/05/2021   10:15 AM  Edinburgh Postnatal Depression Scale Screening Tool  I have been able to laugh and see the funny side of things. 0  I have looked forward with enjoyment to things. 0  I have blamed myself unnecessarily when things went wrong. 0  I have been anxious or worried for no good reason. 2  I have felt scared or panicky for no good reason. 0  Things have been getting on  top of me. 1  I have been so unhappy that I have had difficulty sleeping. 0  I have felt sad or miserable. 1  I have been so unhappy that I have been crying. 0  The thought of harming myself has occurred to me. 0  Edinburgh Postnatal Depression Scale Total 4      Data saved with a previous flowsheet row definition   No data recorded  After visit meds:  Allergies as of 09/15/2023   No Known Allergies   Med Rec must be completed prior to using this [facility]***        Discharge home in stable condition Infant Feeding: Breast Infant Disposition:{CHL IP OB HOME WITH FNUYZM:76418} Discharge instruction: per After Visit Summary and Postpartum booklet. Activity: Advance as tolerated. Pelvic rest for 6 weeks.  Diet: routine diet Future Appointments:No future appointments. Follow up Visit:  Follow-up Information     [facility]. Schedule an appointment as soon as possible for a visit in 4 week(s).   Specialty: Obstetrics and Gynecology Why: Routine postpartum follow up in 4-6 weeks Contact information: [address] [place]  [phone]               Message to [facility] 8/10  Please schedule this patient for a In person postpartum visit in 4 weeks with the following provider: Any provider. Additional Postpartum F/U:None  High risk pregnancy complicated by: vanishing twin syndrome Delivery mode:  Vaginal, Spontaneous Anticipated Birth Control:  Pull out   09/15/2023 [name], MD

## 2023-09-15 NOTE — H&P (Signed)
 Tina Keller is a 28 y.o. G56P2002 female at 104w4d by LMP c/w 8wk u/s presenting for IOL due to LGA; interested in waterbirth (took class 07/22/23) and had CNM visit.   Reports active fetal movement, contractions: irreg/mild, vaginal bleeding: none, membranes: intact.  Initiated prenatal care initially at [facility] at [redacted]w[redacted]d, then transfer to [facility] at [redacted]w[redacted]d due to desiring waterbirth.   Most recent u/s: [redacted]w[redacted]d, EFW 8+12 (90%), AFI 24cm, cephalic, ant placenta.   This pregnancy complicated by: # suspicious for LGA # PGBMI 43 # GBS+  Prenatal History/Complications:  # term vag del x 2 (2017 & 2023)  Past Medical History: Past Medical History:  Diagnosis Date   Chlamydia infection 03/08/2021   Encounter for supervision of normal first pregnancy in second trimester 09/02/2015    Clinic [facility]) Prenatal Labs Dating  LMP Blood type: O/POS/-- (03/31 1427)  Genetic Screen 1 Screen:    AFP:     Quad: Normal    NIPS: Antibody:NEG (03/31 1427) Anatomic US   Repeat U/S normal  Rubella: <0.90 (03/31 1427) GTT Early: 85             Third trimester:  RPR: NON REAC (08/25 1336)  Flu vaccine  Declined  HBsAg: NEGATIVE (03/31 1427)  TDaP vaccine  Given                          Rhogam: N/A HIV: NONREACTIVE (08/25 1336)  Baby Food  Breast and Bottle                                     GBS: (For PCN allergy, check sensitivities)  Positive  Contraception  Undecided  Pap: Circumcision  Yes, [facility]   Pediatrician   Support Person       Iron deficiency anemia of pregnancy 01/20/2021   NSVD (normal spontaneous vaginal delivery) 12/25/2015   Poor weight gain of pregnancy 09/02/2020   Positive GBS test 12/09/2015   Rubella non-immune status, antepartum 12/01/2015   Needs MMR post partum    Past Surgical History: Past Surgical History:  Procedure Laterality Date   NO PAST SURGERIES      Obstetrical History: OB History     Gravida  3   Para  2   Term  2   Preterm      AB      Living  2      SAB       IAB      Ectopic      Multiple  0   Live Births  2           Social History: Social History   Socioeconomic History   Marital status: Single    Spouse name: Not on file   Number of children: Not on file   Years of education: Not on file   Highest education level: Some college, no degree  Occupational History   Not on file  Tobacco Use   Smoking status: Former    Current packs/day: 0.00    Types: Cigarettes    Quit date: 05/25/2020    Years since quitting: 3.3   Smokeless tobacco: Never  Vaping Use   Vaping status: Former   Substances: Nicotine  Substance and Sexual Activity   Alcohol use: Not Currently    Comment: socially   Drug use: No   Sexual activity: Yes  Birth control/protection: Condom  Other Topics Concern   Not on file  Social History Narrative   Not on file   Social Drivers of Health   Financial Resource Strain: Low Risk  (11/26/2022)   Overall Financial Resource Strain (CARDIA)    Difficulty of Paying Living Expenses: Not hard at all  Food Insecurity: No Food Insecurity (09/15/2023)   Hunger Vital Sign    Worried About Running Out of Food in the Last Year: Never true    Ran Out of Food in the Last Year: Never true  Transportation Needs: No Transportation Needs (09/15/2023)   PRAPARE - Administrator, Civil Service (Medical): No    Lack of Transportation (Non-Medical): No  Physical Activity: Sufficiently Active (11/26/2022)   Exercise Vital Sign    Days of Exercise per Week: 6 days    Minutes of Exercise per Session: 60 min  Stress: Stress Concern Present (11/26/2022)   [employer] of Occupational Health - Occupational Stress Questionnaire    Feeling of Stress : To some extent  Social Connections: Socially Isolated (11/26/2022)   Social Connection and Isolation Panel    Frequency of Communication with Friends and Family: Twice a week    Frequency of Social Gatherings with Friends and Family: Once a week    Attends  Religious Services: Never    Database administrator or Organizations: No    Attends Engineer, structural: Not on file    Marital Status: Never married    Family History: Family History  Problem Relation Age of Onset   Hypertension Mother    Asthma Father    Hypertension Maternal Grandfather    Cancer Neg Hx    Diabetes Neg Hx    Heart disease Neg Hx     Allergies: No Known Allergies  Medications Prior to Admission  Medication Sig Dispense Refill Last Dose/Taking   aspirin  EC 81 MG tablet Take 1 tablet (81 mg total) by mouth daily. Swallow whole. 90 tablet 1    Cholecalciferol  50 MCG (2000 UT) CAPS Take 1 capsule (2,000 Units total) by mouth daily. (Patient not taking: Reported on 09/12/2023) 30 capsule 1    Ferric Maltol  (ACCRUFER ) 30 MG CAPS Take 1 capsule (30 mg total) by mouth 2 (two) times daily before a meal. Take 2 hrs before, or 2 hrs after a meal. (Patient not taking: Reported on 09/12/2023) 60 capsule 3    Prenatal Vit-Fe Fumarate-FA (MULTIVITAMIN-PRENATAL) 27-0.8 MG TABS tablet Take 1 tablet by mouth daily.       Review of Systems  Pertinent pos/neg as indicated in HPI  Blood pressure 130/77, pulse 81, height 5' 7.5 (1.715 m), weight 129.3 kg, last menstrual period 12/12/2022. General appearance: alert, cooperative, and no distress Lungs: clear to auscultation bilaterally Heart: regular rate and rhythm Abdomen: gravid, soft, non-tender, EFW by Leopold's approximately 8+lbs Extremities: 1+ edema  Fetal monitoring: FHR: 135-140s bpm, variability: moderate,  Accelerations: present,  decelerations:  Absent Uterine activity: irreg, mild Dilation: 2.5 Effacement (%): 50 Station: -2 Exam by:: Earnesteen Trudy PEAK Presentation: cephalic   Prenatal labs: ABO, Rh: --/--/PENDING (08/10 9364) Antibody: PENDING (08/10 9364) Rubella: 1.34 (12/30 1013) RPR: Non Reactive (05/23 0937)  HBsAg: Negative (12/30 1013)  HIV: Non Reactive (05/23 0937)  GBS: Positive/--  (07/31 1651)  2hr GTT: 87, 79, 85  Prenatal Transfer Tool  Maternal Diabetes: No Genetic Screening: Normal Maternal Ultrasounds/Referrals: Normal Fetal Ultrasounds or other Referrals:  None Maternal Substance Abuse:  No Significant Maternal  Medications:  None Significant Maternal Lab Results: Group B Strep positive  Results for orders placed or performed during the hospital encounter of 09/15/23 (from the past 24 hours)  Type and screen   Collection Time: 09/15/23  6:35 AM  Result Value Ref Range   ABO/RH(D) PENDING    Antibody Screen PENDING    Sample Expiration      09/18/2023,2359 Performed at [facility], [address] [place], [place] [address]      Assessment:  [redacted]w[redacted]d SIUP  [id number]  Suspicious for LGA   Wants waterbirth  GBS+  Cat 1 FHR  GBS Positive/-- (07/31 1651)  Plan:  Admit to L&D  IV pain meds/epidural prn active labor  Will start IOL with dual cytotec  in hopes to potentially avoid Pitocin  as pt desires waterbirth; will reeval in 4h to determine need for AROM and/or Pit; she understands she cannot actively be receiving Pitocin  and be in the tub  PCN for GBS ppx  Anticipate vag delivery   Plans to breastfeed  Contraception: none   Suzen JONETTA Gentry CNM 09/15/2023, 7:09 AM

## 2023-09-15 NOTE — Progress Notes (Signed)
 Dr loyola in room.  AFter sve of pt Dr Madelon instructed RN to give Fentanyl 

## 2023-09-15 NOTE — Progress Notes (Signed)
 Pt switched to intermittent monitor by JINNY Finder CNM

## 2023-09-15 NOTE — Progress Notes (Signed)
 Patient ID: [name], female   DOB: 06-24-1995, 28 y.o.   MRN: [id number]  Vitals:   09/15/23 0804 09/15/23 0902  BP: (!) 109/54 127/84  Pulse: 61 67  Resp: 18 16  Temp: 98.2 F (36.8 C)    Dilation: 2.5 Effacement (%): 50 Station: -2 Exam by:: Earnesteen Trudy PEAK  Assisted patient up to bathroom and back to bed during routine rounding. TOCO not tracing contractions. Patient reports contractions every 5-10 minutes following dual Cytotec  at 0653. Encouraged ambulation in hall or room. FHR Cat 1; will change to intermittent auscultation to promote movement. Plan to discuss AROM if she does not begin experiencing more frequent contractions over next 1-2 hours. Planning waterbirth.  GBS pos > 1st dose of penicillin  complete.  Vernell Ruddle, SNM 09/15/23 1028

## 2023-09-15 NOTE — Progress Notes (Signed)
 LABOR PROGRESS NOTE  Patient Name: Tina Keller, female   DOB: 10-Feb-1995, 28 y.o.  MRN: [id number]  Called to stand by for delivery Pt request IV Fentanyl  given inability to utilize nitrous due to lack of tubing.   Dilation: 7 Effacement (%): 60 Station: -2, -3 Presentation: Vertex Exam by:: Dr Quin  Fentanyl  given for pain control.  No immersion for 45-60 minutes per protocol.  Anticipate SVD  [name], MD

## 2023-09-15 NOTE — Progress Notes (Signed)
 Patient ID: Tina Keller, female   DOB: 02-13-1995, 28 y.o.   MRN: [id number]  Vitals:   09/15/23 1157 09/15/23 1315  BP: (!) 121/59 (!) 126/94  Pulse: 66 (!) 109  Resp: 20   Temp: 97.7 F (36.5 C)    SNM to bedside at 1230. Reports UC q 5-10 minutes. Discussed AROM, which patient desires. Jamilla CNM to bedside to sign waterbirth consent. SVE 4 cm, 60%, fetal station -2 and head well applied to cervix. AROM successful with [place] and return of clear fluid seen. FHR remained Cat 1 through AROM and following. Patient assisted to toilet.   SNM and CNM called back to room by RN a short time later because patient feeling increased rectal pressure. Assisted patient into tub. Single elevated BP once in tub between painful contractions. No other history of hypertension, previous BP normal. Will transition back to IA; SNM dopplered FHR through and following contraction: FHR 135, no increases or decreases. Plan to saline lock IV once 2nd dose of penicillin  is administered.  Vernell Ruddle, SNM [phone]

## 2023-09-16 LAB — CBC
HCT: 32.9 % — ABNORMAL LOW (ref 36.0–46.0)
Hemoglobin: 11 g/dL — ABNORMAL LOW (ref 12.0–15.0)
MCH: 28.5 pg (ref 26.0–34.0)
MCHC: 33.4 g/dL (ref 30.0–36.0)
MCV: 85.2 fL (ref 80.0–100.0)
Platelets: 142 K/uL — ABNORMAL LOW (ref 150–400)
RBC: 3.86 MIL/uL — ABNORMAL LOW (ref 3.87–5.11)
RDW: 14 % (ref 11.5–15.5)
WBC: 11.9 K/uL — ABNORMAL HIGH (ref 4.0–10.5)
nRBC: 0 % (ref 0.0–0.2)

## 2023-09-16 MED ORDER — ACETAMINOPHEN 325 MG PO TABS: 650.0000 mg | ORAL_TABLET | ORAL | 0 refills | Status: AC | PRN

## 2023-09-16 MED ORDER — IBUPROFEN 600 MG PO TABS: 600.0000 mg | ORAL_TABLET | Freq: Four times a day (QID) | ORAL | 0 refills | Status: AC

## 2023-09-16 MED ORDER — SENNOSIDES-DOCUSATE SODIUM 8.6-50 MG PO TABS
2.0000 | ORAL_TABLET | ORAL | 0 refills | Status: AC
Start: 2023-09-16 — End: ?

## 2023-09-16 NOTE — Lactation Note (Signed)
 This note was copied from a baby's chart. Lactation Consultation Note  Patient Name: Tina Keller Date: 09/16/2023 Age:28 hours Reason for consult: Follow-up assessment;Maternal discharge;Term  P2, 39 wks, @ 25 hrs of life. Mom ready foe early discharge. Discussed [employer] pump seeing BCBS as primary, not medicaid. Per mom- her fathers insurance- has not had since 34. Apologized for complication, mom only formula fed so far. Talked about encouraging milk vs discouraging milk production. Resource paper for edgepark and aeorflow to mom in case she changes her mind. Encouraged mom to keep working on big mouth latch with baby and use EBM or coconut oil after each feed. Discussed cluster feeding overnight/ early morning brings in our milk supply, shared expectations of milk coming in. Highlighted risk of engorgement. Discussed hand pump/express to soften breasts, motrin  as anti-inflammatory, and ice packs for 10-20 minutes post feed/pumping if still over-full is the best treatments for inflamed/engorged breasts.  Feeding Mother's Current Feeding Choice: Breast Milk and Formula   Interventions Interventions: Hand pump;Education  Discharge Discharge Education: Engorgement and breast care Pump: Manual;Personal  Consult Status Consult Status: Complete Date: 09/16/23 Follow-up type: In-patient    Tina Keller 09/16/2023, 4:38 PM

## 2023-09-16 NOTE — Progress Notes (Addendum)
 POSTPARTUM PROGRESS NOTE  Post Partum Day 1  Subjective:  Tina Keller is a 28 y.o. H6E6996 s/p SVD at [redacted]w[redacted]d.  She reports she is doing well. No acute events overnight. She denies any problems with ambulating, voiding or po intake. Denies nausea or vomiting.  Pain is well controlled.  Lochia is slightly heavier than a period.  Objective: Blood pressure 129/83, pulse (!) 57, temperature 97.9 F (36.6 C), temperature source Oral, resp. rate 18, height 5' 7.5 (1.715 m), weight 129.3 kg, last menstrual period 12/12/2022, SpO2 100%, unknown if currently breastfeeding.  Physical Exam:  General: alert, cooperative and no distress Chest: no respiratory distress Heart:regular rate, distal pulses intact Uterine Fundus: firm, appropriately tender DVT Evaluation: No calf swelling or tenderness Extremities: No edema Skin: warm, dry  Recent Labs    09/15/23 1615 09/16/23 0424  HGB 11.5* 11.0*  HCT 35.6* 32.9*    Assessment/Plan: Tina Keller is a 28 y.o. H6E6996 s/p SVD at [redacted]w[redacted]d   PPD#1 - Doing well  Routine postpartum care  Elevated BP without HTN - Markedly elevated BP readings in the immediate postpartum period with subsequent normotensive readings with a single dose of labetalol  - Normotensive since - Asymptomatic    Contraception: Paraguard IUD OP Feeding: Breast and bottle Dispo: Plan for discharge this afternoon vs tomorrow.   LOS: 1 day   Vernell DELENA School, MD FM Resident  09/16/2023, 7:35 AM   Attestation of Supervision of Student:  I confirm that I have verified the information documented in the resident's note and that I have also personally reperformed the history, physical exam and all medical decision making activities.  I have verified that all services and findings are accurately documented in this student's note; and I agree with management and plan as outlined in the documentation. I have also made any necessary editorial changes.  Almarie CHRISTELLA Moats, MD OB  Fellow 09/16/2023 7:46 AM

## 2023-09-16 NOTE — Progress Notes (Signed)
 [name] was referred for history of depression/anxiety.  * Referral screened out by Clinical Social Worker because none of the following criteria appear to apply:  ~ History of anxiety/depression during this pregnancy, or of post-partum depression following prior delivery.  ~ Diagnosis of anxiety and/or depression within last 3 years  OR  * [name]'s symptoms currently being treated with medication and/or therapy.  Per Ob notes, [name] has an active prescription for Lexapro  10mg  for support.  Please contact the Clinical Social Worker if needs arise, by [facility] request, or if [name] scores greater than 9/yes to question 10 on Edinburgh Postpartum Depression Screen.  Rosina Molt, [place] Clinical Social Worker [phone]

## 2023-09-28 ENCOUNTER — Telehealth (HOSPITAL_COMMUNITY): Payer: Self-pay

## 2023-09-28 NOTE — Telephone Encounter (Signed)
 09/28/2023 [id number]  Name: Tina Keller MRN: [id number] DOB: Jul 09, 1995  Reason for Call:  Transition of Care Hospital Discharge Call  Contact Status: Patient Contact Status: Message  Language assistant needed:          Follow-Up Questions:    Van Postnatal Depression Scale:  In the Past 7 Days:    PHQ2-9 Depression Scale:     Discharge Follow-up:    Post-discharge interventions: NA  Signature  Rosaline Deretha PEAK

## 2023-10-14 ENCOUNTER — Ambulatory Visit: Admitting: Obstetrics

## 2023-10-16 NOTE — Progress Notes (Signed)
 Patient ID: Tina Keller, female   DOB: Jun 26, 1995, 28 y.o.   MRN: [id number] Error. See NST note.

## 2023-11-15 ENCOUNTER — Other Ambulatory Visit: Payer: Self-pay | Admitting: Medical Genetics

## 2023-11-15 DIAGNOSIS — Z006 Encounter for examination for normal comparison and control in clinical research program: Secondary | ICD-10-CM

## 2024-02-05 ENCOUNTER — Ambulatory Visit: Payer: Self-pay | Admitting: Student

## 2024-02-05 ENCOUNTER — Encounter: Payer: Self-pay | Admitting: Student

## 2024-02-05 ENCOUNTER — Ambulatory Visit: Admitting: Student

## 2024-02-05 VITALS — BP 118/80 | HR 75 | Ht 67.5 in | Wt 264.8 lb

## 2024-02-05 DIAGNOSIS — F329 Major depressive disorder, single episode, unspecified: Secondary | ICD-10-CM

## 2024-02-05 DIAGNOSIS — Z111 Encounter for screening for respiratory tuberculosis: Secondary | ICD-10-CM

## 2024-02-05 NOTE — Progress Notes (Signed)
" ° ° °  SUBJECTIVE:   CHIEF COMPLAINT / HPI:   Tina Keller is a 28 y.o. female presenting for follow up.   Doing well since last visit.   Depression: improving, has good family support. Is not currently on medication.   PERTINENT  PMH / PSH: reviewed and updated.  OBJECTIVE:   BP 118/80   Pulse 75   Ht 5' 7.5 (1.715 m)   Wt 264 lb 12.8 oz (120.1 kg)   LMP 01/22/2024 (Exact Date)   SpO2 100%   BMI 40.86 kg/m   Well-appearing, no acute distress Cardio: Regular rate, regular rhythm, no murmurs on exam. Pulm: Clear, no wheezing, no crackles. No increased work of breathing Psych:  Cognition and judgment appear intact. Alert, communicative  and cooperative with normal attention span and concentration. No apparent delusions, illusions, hallucinations    ASSESSMENT/PLAN:   Assessment & Plan Major depressive disorder with current active episode, unspecified depression episode severity, unspecified whether recurrent Doing well. Reports feeling back to baseline.  Not currently on medications  Feels like she has a good support system  Screening-pulmonary TB Quant gold obtained today for work    Patient declined flu shot   Damien Pinal, DO [facility]  "

## 2024-02-05 NOTE — Patient Instructions (Signed)
 It was great to see you today!   I have ordered lab work today. I will send you a message through MyChart or send you a letter with your results. If there is an abnormal result, I will give you a call.    No future appointments.  Please arrive 15 minutes before your appointment to ensure smooth check in process.  If you are more than 15 minutes late, you may be asked to reschedule.   Please bring a list of your medications with you to all appointments.   Please call the clinic at [phone] if your symptoms worsen or you have any concerns.  Thank you for allowing me to participate in your care, Dr. Damien Pinal [facility]

## 2024-02-05 NOTE — Progress Notes (Signed)
 Went to say hello since I have not seen her in a while. Doing well overall. Declined flu shot. Will discuss with resident once her evaluation is completed.

## 2024-02-05 NOTE — Assessment & Plan Note (Addendum)
 Doing well. Reports feeling back to baseline.  Not currently on medications  Feels like she has a good support system

## 2024-02-08 ENCOUNTER — Ambulatory Visit: Payer: Self-pay | Admitting: Student

## 2024-02-08 LAB — QUANTIFERON-TB GOLD PLUS
QuantiFERON Mitogen Value: >10 [IU]/mL
QuantiFERON Nil Value: 0.16 [IU]/mL
QuantiFERON TB1 Ag Value: 0.11 [IU]/mL
QuantiFERON TB2 Ag Value: 0.14 [IU]/mL

## 2024-03-06 ENCOUNTER — Ambulatory Visit: Admitting: Family Medicine
